# Patient Record
Sex: Male | Born: 1960 | Race: Black or African American | Hispanic: No | Marital: Single | Smoking: Current every day smoker
Health system: Southern US, Community
[De-identification: ages and names within clinical notes are randomized; demographics above are authoritative.]

## PROBLEM LIST (undated history)

## (undated) DIAGNOSIS — F1910 Other psychoactive substance abuse, uncomplicated: Secondary | ICD-10-CM

## (undated) DIAGNOSIS — K222 Esophageal obstruction: Secondary | ICD-10-CM

## (undated) DIAGNOSIS — J45909 Unspecified asthma, uncomplicated: Secondary | ICD-10-CM

## (undated) DIAGNOSIS — B3781 Candidal esophagitis: Secondary | ICD-10-CM

## (undated) DIAGNOSIS — R1310 Dysphagia, unspecified: Secondary | ICD-10-CM

## (undated) DIAGNOSIS — I5022 Chronic systolic (congestive) heart failure: Secondary | ICD-10-CM

## (undated) DIAGNOSIS — M1990 Unspecified osteoarthritis, unspecified site: Secondary | ICD-10-CM

## (undated) DIAGNOSIS — Z9119 Patient's noncompliance with other medical treatment and regimen: Secondary | ICD-10-CM

## (undated) DIAGNOSIS — Z91199 Patient's noncompliance with other medical treatment and regimen due to unspecified reason: Secondary | ICD-10-CM

## (undated) DIAGNOSIS — K219 Gastro-esophageal reflux disease without esophagitis: Secondary | ICD-10-CM

## (undated) DIAGNOSIS — I10 Essential (primary) hypertension: Secondary | ICD-10-CM

## (undated) DIAGNOSIS — E669 Obesity, unspecified: Secondary | ICD-10-CM

## (undated) HISTORY — PX: CORONARY ANGIOPLASTY: SHX604

## (undated) HISTORY — PX: ESOPHAGOGASTRODUODENOSCOPY: SHX1529

## (undated) HISTORY — PX: COLONOSCOPY: SHX174

---

## 2005-07-04 ENCOUNTER — Ambulatory Visit: Payer: Self-pay

## 2010-03-31 ENCOUNTER — Inpatient Hospital Stay: Payer: Self-pay | Admitting: Internal Medicine

## 2017-06-19 ENCOUNTER — Ambulatory Visit: Payer: Self-pay | Admitting: Orthopedic Surgery

## 2017-07-15 ENCOUNTER — Inpatient Hospital Stay: Admit: 2017-07-15 | Payer: Self-pay | Admitting: Orthopedic Surgery

## 2017-07-15 SURGERY — ARTHROPLASTY, HIP, TOTAL,POSTERIOR APPROACH
Anesthesia: Choice | Laterality: Right

## 2017-09-06 ENCOUNTER — Inpatient Hospital Stay
Admission: EM | Admit: 2017-09-06 | Discharge: 2017-09-09 | DRG: 293 | Disposition: A | Discharge status: Home / Self Care | Payer: BLUE CROSS/BLUE SHIELD | Attending: Internal Medicine | Admitting: Internal Medicine

## 2017-09-06 ENCOUNTER — Emergency Department: Payer: BLUE CROSS/BLUE SHIELD

## 2017-09-06 DIAGNOSIS — I110 Hypertensive heart disease with heart failure: Principal | ICD-10-CM | POA: Diagnosis present

## 2017-09-06 DIAGNOSIS — F10229 Alcohol dependence with intoxication, unspecified: Secondary | ICD-10-CM | POA: Diagnosis present

## 2017-09-06 DIAGNOSIS — R778 Other specified abnormalities of plasma proteins: Secondary | ICD-10-CM | POA: Diagnosis present

## 2017-09-06 DIAGNOSIS — R0602 Shortness of breath: Secondary | ICD-10-CM

## 2017-09-06 DIAGNOSIS — I5023 Acute on chronic systolic (congestive) heart failure: Secondary | ICD-10-CM | POA: Diagnosis present

## 2017-09-06 DIAGNOSIS — F1410 Cocaine abuse, uncomplicated: Secondary | ICD-10-CM | POA: Diagnosis present

## 2017-09-06 DIAGNOSIS — J45909 Unspecified asthma, uncomplicated: Secondary | ICD-10-CM | POA: Diagnosis present

## 2017-09-06 DIAGNOSIS — I440 Atrioventricular block, first degree: Secondary | ICD-10-CM | POA: Diagnosis present

## 2017-09-06 DIAGNOSIS — I340 Nonrheumatic mitral (valve) insufficiency: Secondary | ICD-10-CM | POA: Diagnosis not present

## 2017-09-06 DIAGNOSIS — I509 Heart failure, unspecified: Secondary | ICD-10-CM

## 2017-09-06 DIAGNOSIS — Y908 Blood alcohol level of 240 mg/100 ml or more: Secondary | ICD-10-CM | POA: Diagnosis present

## 2017-09-06 DIAGNOSIS — F17200 Nicotine dependence, unspecified, uncomplicated: Secondary | ICD-10-CM | POA: Diagnosis present

## 2017-09-06 DIAGNOSIS — M7989 Other specified soft tissue disorders: Secondary | ICD-10-CM | POA: Diagnosis present

## 2017-09-06 DIAGNOSIS — R609 Edema, unspecified: Secondary | ICD-10-CM

## 2017-09-06 DIAGNOSIS — Z79899 Other long term (current) drug therapy: Secondary | ICD-10-CM | POA: Diagnosis not present

## 2017-09-06 DIAGNOSIS — R7989 Other specified abnormal findings of blood chemistry: Secondary | ICD-10-CM

## 2017-09-06 DIAGNOSIS — F1010 Alcohol abuse, uncomplicated: Secondary | ICD-10-CM

## 2017-09-06 HISTORY — DX: Essential (primary) hypertension: I10

## 2017-09-06 HISTORY — DX: Unspecified asthma, uncomplicated: J45.909

## 2017-09-06 LAB — CBC
HEMATOCRIT: 45.2 % (ref 40.0–52.0)
HEMOGLOBIN: 14.9 g/dL (ref 13.0–18.0)
MCH: 32.7 pg (ref 26.0–34.0)
MCHC: 33 g/dL (ref 32.0–36.0)
MCV: 99.1 fL (ref 80.0–100.0)
Platelets: 154 10*3/uL (ref 150–440)
RBC: 4.56 MIL/uL (ref 4.40–5.90)
RDW: 13.7 % (ref 11.5–14.5)
WBC: 5.4 10*3/uL (ref 3.8–10.6)

## 2017-09-06 LAB — HEPATIC FUNCTION PANEL
ALBUMIN: 3.7 g/dL (ref 3.5–5.0)
ALK PHOS: 107 U/L (ref 38–126)
ALT: 18 U/L (ref 17–63)
AST: 48 U/L — AB (ref 15–41)
BILIRUBIN TOTAL: 1.7 mg/dL — AB (ref 0.3–1.2)
Bilirubin, Direct: 0.7 mg/dL — ABNORMAL HIGH (ref 0.1–0.5)
Indirect Bilirubin: 1 mg/dL — ABNORMAL HIGH (ref 0.3–0.9)
TOTAL PROTEIN: 8.1 g/dL (ref 6.5–8.1)

## 2017-09-06 LAB — BASIC METABOLIC PANEL
ANION GAP: 15 (ref 5–15)
BUN: 13 mg/dL (ref 6–20)
CHLORIDE: 94 mmol/L — AB (ref 101–111)
CO2: 29 mmol/L (ref 22–32)
Calcium: 8.1 mg/dL — ABNORMAL LOW (ref 8.9–10.3)
Creatinine, Ser: 1.2 mg/dL (ref 0.61–1.24)
Glucose, Bld: 84 mg/dL (ref 65–99)
POTASSIUM: 2.7 mmol/L — AB (ref 3.5–5.1)
SODIUM: 138 mmol/L (ref 135–145)

## 2017-09-06 LAB — URINALYSIS, COMPLETE (UACMP) WITH MICROSCOPIC
BACTERIA UA: NONE SEEN
BILIRUBIN URINE: NEGATIVE
GLUCOSE, UA: NEGATIVE mg/dL
Hgb urine dipstick: NEGATIVE
KETONES UR: NEGATIVE mg/dL
LEUKOCYTES UA: NEGATIVE
NITRITE: NEGATIVE
PH: 6 (ref 5.0–8.0)
PROTEIN: 30 mg/dL — AB
SQUAMOUS EPITHELIAL / LPF: NONE SEEN
Specific Gravity, Urine: 1.009 (ref 1.005–1.030)

## 2017-09-06 LAB — ETHANOL: ALCOHOL ETHYL (B): 270 mg/dL — AB (ref <10)

## 2017-09-06 LAB — TROPONIN I: Troponin I: 0.05 ng/mL (ref <0.03)

## 2017-09-06 LAB — BRAIN NATRIURETIC PEPTIDE: B NATRIURETIC PEPTIDE 5: 2029 pg/mL — AB (ref 0.0–100.0)

## 2017-09-06 MED ORDER — FUROSEMIDE 10 MG/ML IJ SOLN
40.0000 mg | Freq: Once | INTRAMUSCULAR | Status: AC
  Administered 2017-09-06: 40 mg via INTRAVENOUS
  Filled 2017-09-06: qty 4

## 2017-09-06 MED ORDER — POTASSIUM CHLORIDE 20 MEQ PO PACK: 40.0000 meq | PACK | Freq: Once | ORAL | Status: DC

## 2017-09-06 MED ORDER — POTASSIUM CHLORIDE 20 MEQ PO PACK: 60.0000 meq | PACK | Freq: Once | ORAL | Status: DC

## 2017-09-06 MED ORDER — ASPIRIN 81 MG PO CHEW
324.0000 mg | CHEWABLE_TABLET | Freq: Once | ORAL | Status: AC
  Administered 2017-09-06: 324 mg via ORAL
  Filled 2017-09-06: qty 4

## 2017-09-06 MED ORDER — LORAZEPAM 1 MG PO TABS: 1.0000 mg | ORAL_TABLET | Freq: Four times a day (QID) | ORAL | Status: DC | PRN

## 2017-09-06 MED ORDER — LORAZEPAM 2 MG/ML IJ SOLN: 1.0000 mg | Freq: Four times a day (QID) | INTRAMUSCULAR | Status: DC | PRN

## 2017-09-06 MED ORDER — ADULT MULTIVITAMIN W/MINERALS CH
1.0000 | ORAL_TABLET | Freq: Every day | ORAL | Status: DC
  Administered 2017-09-07 – 2017-09-08 (×2): 1 via ORAL
  Filled 2017-09-06: qty 1

## 2017-09-06 MED ORDER — POTASSIUM CHLORIDE CRYS ER 20 MEQ PO TBCR
40.0000 meq | EXTENDED_RELEASE_TABLET | Freq: Once | ORAL | Status: AC
  Administered 2017-09-06: 40 meq via ORAL
  Filled 2017-09-06: qty 2

## 2017-09-06 NOTE — ED Triage Notes (Signed)
Pt came to ED via pov c/o couch, sob, facial swelling for past few weeks per patient. Pt denies history of chf or heart problems. Pt HR 117

## 2017-09-06 NOTE — ED Notes (Signed)
Pt chewed tablets and drank water

## 2017-09-06 NOTE — H&P (Signed)
[facility] [place] [facility]   PATIENT NAME: Marcus Foster    MR#:  [id number]  DATE OF BIRTH:  05/04/1961  DATE OF ADMISSION:  09/06/2017  PRIMARY CARE PHYSICIAN: Patient, No Pcp Per   REQUESTING/REFERRING PHYSICIAN:   CHIEF COMPLAINT:   Chief Complaint  Patient presents with  . Facial Swelling  . Shortness of Breath    HISTORY OF PRESENT ILLNESS: Marcus Foster  is a 56 y.o. male with a known history of childhood asthma, esophageal stricture, dysphagia, diverticulosis, heavy alcohol abuse, chronic tobacco smoking abuse, chronic cocaine abuse-last use 3 days ago presents to the emergency room with 1-23-month history of worsening cough, shortness of breath, dyspnea on exertion, facial as well as lower extremity swelling, difficulty breathing when recumbent, in the emergency room patient was found to have alcohol level 270, chest x-ray noted for mild cardiomegaly/hyperinflation, EKG with sinus tachycardia with first-degree AV block/anterior infarct-age indeterminate-new since 2011 in comparison, potassium 2.7, chloride 94, AST 48, total bili 1.7, BNP greater than 2000, troponin 0.05, CBC unimpressive, urinalysis negative, urine drug screen pending, patient evaluated in the emergency room, noted to be in mild distress/respiratory distress, patient denies any pain, patient now being admitted for new/y diagnosis probable systolic congestive heart failure exacerbation most likely exacerbated by alcoholism/chronic cocaine/tobacco smoking abuse.  PAST MEDICAL HISTORY:   Past Medical History:  Diagnosis Date  . Asthma   . Hypertension     PAST SURGICAL HISTORY: none  SOCIAL HISTORY:  Social History   Tobacco Use  . Smoking status: Current Every Day Smoker  . Smokeless tobacco: Never Used  Substance Use Topics  . Alcohol use: Yes    Comment: "couple shots a day" and beer    FAMILY HISTORY: No family history on file.  DRUG ALLERGIES: No Known Allergies  REVIEW OF  SYSTEMS:   CONSTITUTIONAL: No fever, +fatigue/weakness.  EYES: No blurred or double vision.  EARS, NOSE, AND THROAT: No tinnitus or ear pain.  RESPIRATORY: +cough/shortness of breath/wheezing, no hemoptysis.  CARDIOVASCULAR: No chest pain, +orthopnea/edema.  GASTROINTESTINAL: No nausea, vomiting, diarrhea or abdominal pain.  GENITOURINARY: No dysuria, hematuria.  ENDOCRINE: No polyuria, nocturia,  HEMATOLOGY: No anemia, easy bruising or bleeding SKIN: No rash or lesion. MUSCULOSKELETAL: No joint pain or arthritis.   NEUROLOGIC: No tingling, numbness, weakness.  PSYCHIATRY: No anxiety or depression.   MEDICATIONS AT HOME:  Prior to Admission medications   Medication Sig Start Date End Date Taking? Authorizing Provider  amLODipine-atorvastatin (CADUET) 10-40 MG tablet Take 1 tablet by mouth daily.   Yes [provider]  cholecalciferol (VITAMIN D) 1000 units tablet Take 1,000 Units by mouth daily.   Yes [provider]  lisinopril (PRINIVIL,ZESTRIL) 10 MG tablet Take 30 mg by mouth daily. 05/24/15  Yes [provider]      PHYSICAL EXAMINATION:   VITAL SIGNS: Blood pressure (!) 128/111, pulse (!) 115, temperature 98.1 F (36.7 C), temperature source Oral, resp. rate 16, height 5\' 10"  (1.778 m), weight 95.3 kg (210 lb), SpO2 97 %.  GENERAL:  56 y.o.-year-old patient lying in the bed with mild respiratory acute distress.  Obese, nontoxic appearing EYES: Pupils equal, round, reactive to light and accommodation. No scleral icterus. Extraocular muscles intact.  HEENT: Head atraumatic, normocephalic. Oropharynx and nasopharynx clear.  NECK:  Supple, no jugular venous distention. No thyroid enlargement, no tenderness.  LUNGS: Mild Rales at the bases bilaterally, mild increased work of breathing.  CARDIOVASCULAR: S1, S2 normal. No murmurs, rubs, or  gallops.  ABDOMEN: Soft, nontender, nondistended. Bowel sounds present. No organomegaly or mass.  EXTREMITIES:  Bilateral lower extremity pitting edema, no cyanosis, or clubbing.  NEUROLOGIC: Cranial nerves II through XII are intact. MAES. Gait not checked.  PSYCHIATRIC: The patient is alert and oriented x 3.  SKIN: No obvious rash, lesion, or ulcer.   LABORATORY PANEL:   CBC Recent Labs  Lab 09/06/17 1825  WBC 5.4  HGB 14.9  HCT 45.2  PLT 154  MCV 99.1  MCH 32.7  MCHC 33.0  RDW 13.7   ------------------------------------------------------------------------------------------------------------------  Chemistries  Recent Labs  Lab 09/06/17 1825 09/06/17 1932  NA 138  --   K 2.7*  --   CL 94*  --   CO2 29  --   GLUCOSE 84  --   BUN 13  --   CREATININE 1.20  --   CALCIUM 8.1*  --   AST  --  48*  ALT  --  18  ALKPHOS  --  107  BILITOT  --  1.7*   ------------------------------------------------------------------------------------------------------------------ estimated creatinine clearance is 77.7 mL/min (by C-G formula based on SCr of 1.2 mg/dL). ------------------------------------------------------------------------------------------------------------------ No results for input(s): TSH, T4TOTAL, T3FREE, THYROIDAB in the last 72 hours.  Invalid input(s): FREET3   Coagulation profile No results for input(s): INR, PROTIME in the last 168 hours. ------------------------------------------------------------------------------------------------------------------- No results for input(s): DDIMER in the last 72 hours. -------------------------------------------------------------------------------------------------------------------  Cardiac Enzymes Recent Labs  Lab 09/06/17 1825  TROPONINI 0.05*   ------------------------------------------------------------------------------------------------------------------ Invalid input(s): POCBNP  ---------------------------------------------------------------------------------------------------------------  Urinalysis    Component  Value Date/Time   COLORURINE YELLOW (A) 09/06/2017 2145   APPEARANCEUR CLEAR (A) 09/06/2017 2145   LABSPEC 1.009 09/06/2017 2145   PHURINE 6.0 09/06/2017 2145   GLUCOSEU NEGATIVE 09/06/2017 2145   HGBUR NEGATIVE 09/06/2017 2145   BILIRUBINUR NEGATIVE 09/06/2017 2145   KETONESUR NEGATIVE 09/06/2017 2145   PROTEINUR 30 (A) 09/06/2017 2145   NITRITE NEGATIVE 09/06/2017 2145   LEUKOCYTESUR NEGATIVE 09/06/2017 2145     RADIOLOGY: Dg Chest 2 View  Result Date: 09/06/2017 CLINICAL DATA:  Short of breath EXAM: CHEST  2 VIEW COMPARISON:  07/26/1996 report FINDINGS: Mild hyperinflation. No pleural effusion or focal consolidation. Mild cardiomegaly. No pneumothorax. IMPRESSION: Hyperinflation without focal infiltrate or edema. Mild cardiomegaly. Electronically Signed   By: Jasmine Pang M.D.   On: 09/06/2017 19:21    EKG: Orders placed or performed during the hospital encounter of 09/06/17  . EKG 12-Lead  . EKG 12-Lead  . ED EKG within 10 minutes  . ED EKG within 10 minutes    IMPRESSION AND PLAN: 1 acute probable newly diagnosed systolic congestive heart failure exacerbation Most likely secondary to multifactorial process which includes chronic cocaine abuse-last use 3 days ago, alcoholism, and chronic tobacco smoking  Admit to RNF bed on her CHF protocol, IV Lasix twice daily, aspirin daily, lisinopril, avoid beta-blocker therapy given chronic cocaine abuse, statin therapy-check lipids in the morning, check echocardiogram, check urine drug screen, strict I&O monitoring, daily weights, cardiac/low-sodium diet, continue close medical monitoring  2 acute alcohol intoxication with chronic alcoholism Drinks 1/2-1 pint of liquor daily, no interest in quitting Will place on alcohol withdrawal protocol, seizure/fall precautions  3 chronic cocaine abuse Last used 3 days ago Check urine drug screen Patient advised to quit  4 chronic tobacco smoking abuse/dependency Nicotine patch daily and  cessation counseling ordered  5 chronic benign essential hypertension Stable Continue home regimen, IV hydralazine as needed systolic blood pressure greater than 160,  vitals per routine, make changes as necessary  Full code Patient stable Prognosis fair DVT prophylaxis with Lovenox subcu Disposition Home in 2 - 3 days    All the records are reviewed and case discussed with ED provider. Management plans discussed with the patient, family and they are in agreement.  CODE STATUS: Code Status History    This patient does not have a recorded code status. Please follow your organizational policy for patients in this situation.       TOTAL TIME TAKING CARE OF THIS PATIENT: 40 minutes.    Evelena AsaMontell D Zafira Munos M.D on 09/06/2017   Between 7am to 6pm - Pager - [phone]  After 6pm go to [email or web address] - password [email or web address]  [facility]  Office  [phone]  CC: Primary care physician; Patient, No Pcp Per   Note: This dictation was prepared with Dragon dictation along with smaller phrase technology. Any transcriptional errors that result from this process are unintentional.

## 2017-09-06 NOTE — ED Notes (Signed)
CRITICAL LAB: Potassium is 2.7, TROPONIN is 0.05, [employer], Dr. Mayford Knife notified, orders received

## 2017-09-06 NOTE — ED Provider Notes (Addendum)
[facility] Emergency Department Provider Note       Time seen: ----------------------------------------- 7:18 PM on 09/06/2017 -----------------------------------------   I have reviewed the triage vital signs and the nursing notes.  HISTORY   Chief Complaint Facial Swelling and Shortness of Breath    HPI Marcus Foster is a 56 y.o. male with a history of asthma and hypertension who presents to the ED for cough and shortness of breath.  Patient describes particular shortness of breath when he lays flat.  He denies any known history of CHF or heart problems.  This has been persistent for the last several weeks but is now worsening over the past several days to the point where he has swelling diffusely including his face.  Patient states he drinks a pint of beer every day  Past Medical History:  Diagnosis Date  . Asthma   . Hypertension     There are no active problems to display for this patient.     Allergies Patient has no known allergies.  Social History Social History   Tobacco Use  . Smoking status: Current Every Day Smoker  . Smokeless tobacco: Never Used  Substance Use Topics  . Alcohol use: Yes    Comment: "couple shots a day" and beer  . Drug use: Not on file    Review of Systems Constitutional: Negative for fever. Eyes: Negative for vision changes ENT:  Negative for congestion, sore throat Cardiovascular: Negative for chest pain. Respiratory: Positive for shortness of breath Gastrointestinal: Negative for abdominal pain, vomiting and diarrhea. Musculoskeletal: Negative for back pain.  Positive for edema Skin: Negative for rash. Neurological: Negative for headaches, focal weakness or numbness.  All systems negative/normal/unremarkable except as stated in the HPI  ____________________________________________   PHYSICAL EXAM:  VITAL SIGNS: ED Triage Vitals  Enc Vitals Group     BP 09/06/17 1822 125/83     Pulse Rate  09/06/17 1822 (!) 112     Resp 09/06/17 1822 20     Temp 09/06/17 1822 98.1 F (36.7 C)     Temp Source 09/06/17 1822 Oral     SpO2 09/06/17 1822 96 %     Weight 09/06/17 1821 210 lb (95.3 kg)     Height 09/06/17 1821 5\' 10"  (1.778 m)     Head Circumference --      Peak Flow --      Pain Score --      Pain Loc --      Pain Edu? --      Excl. in GC? --     Constitutional: Alert and oriented. Well appearing and in no distress. Eyes: Conjunctivae are icteric. Normal extraocular movements. ENT   Head: Normocephalic and atraumatic.  Facial edema is noted   Nose: No congestion/rhinnorhea.   Mouth/Throat: Mucous membranes are moist.   Neck: No stridor. Cardiovascular: Normal rate, regular rhythm. No murmurs, rubs, or gallops. Respiratory: Normal respiratory effort without tachypnea nor retractions.  Scattered rhonchi Gastrointestinal: Soft and nontender. Normal bowel sounds Musculoskeletal: Nontender with normal range of motion in extremities.  Bilateral edema Neurologic:  Normal speech and language. No gross focal neurologic deficits are appreciated.  Skin:  Skin is warm, dry and intact. No rash noted. Psychiatric: Mood and affect are normal. Speech and behavior are normal.  ____________________________________________  EKG: Interpreted by me.  Sinus tachycardia with a rate of 119 bpm, prolonged PR interval, normal QT, normal axis.  ____________________________________________  ED COURSE:  Pertinent labs & imaging  results that were available during my care of the patient were reviewed by me and considered in my medical decision making (see chart for details). Patient presents for shortness of breath and edema, we will assess with labs and imaging as indicated. Clinical Course as of Sep 06 2128  Wynelle Link Sep 06, 2017  2011 Alcohol, Ethyl (B): (!) 270 [JW]  2011 Troponin I: (!!) 0.05 [JW]  2011 Potassium: (!!) 2.7 [JW]    Clinical Course User Index [JW] [name] Filbert, MD   Procedures ____________________________________________   LABS (pertinent positives/negatives)  Labs Reviewed  BASIC METABOLIC PANEL - Abnormal; Notable for the following components:      Result Value   Potassium 2.7 (*)    Chloride 94 (*)    Calcium 8.1 (*)    All other components within normal limits  TROPONIN I - Abnormal; Notable for the following components:   Troponin I 0.05 (*)    All other components within normal limits  HEPATIC FUNCTION PANEL - Abnormal; Notable for the following components:   AST 48 (*)    Total Bilirubin 1.7 (*)    Bilirubin, Direct 0.7 (*)    Indirect Bilirubin 1.0 (*)    All other components within normal limits  ETHANOL - Abnormal; Notable for the following components:   Alcohol, Ethyl (B) 270 (*)    All other components within normal limits  BRAIN NATRIURETIC PEPTIDE - Abnormal; Notable for the following components:   B Natriuretic Peptide 2,029.0 (*)    All other components within normal limits  CBC  URINALYSIS, COMPLETE (UACMP) WITH MICROSCOPIC    RADIOLOGY Images were viewed by me  Chest x-ray IMPRESSION: Hyperinflation without focal infiltrate or edema. Mild cardiomegaly. ____________________________________________  DIFFERENTIAL DIAGNOSIS   CHF, intoxication, renal failure, cirrhosis, alcohol induced cardiomyopathy, nephrotic syndrome  FINAL ASSESSMENT AND PLAN  Shortness of breath, edema, alcohol intoxication, elevated troponin   Plan: Patient had presented for shortness of breath and swelling that has progressively worsened. Patient's labs were concerning for several reasons including elevated troponin, elevated BNP and elevated alcohol level.  Potassium was also low for which she was given oral potassium.  Patient's imaging was overall reassuring although he likely has some cardiomegaly. 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  I will give him an adult aspirin  as well as IV Lasix and discuss with the hospitalist for admission.   [name] Filbert, MD   Note: This note was generated in part or whole with voice recognition software. Voice recognition is usually quite accurate but there are transcription errors that can and very often do occur. I apologize for any typographical errors that were not detected and corrected.     [name] Filbert, MD 09/06/17 2130    [name] Filbert, MD 09/06/17 2132

## 2017-09-07 ENCOUNTER — Inpatient Hospital Stay (HOSPITAL_COMMUNITY)
Admit: 2017-09-07 | Discharge: 2017-09-07 | Disposition: A | Discharge status: Home / Self Care | Payer: BLUE CROSS/BLUE SHIELD | Attending: Family Medicine | Admitting: Family Medicine

## 2017-09-07 ENCOUNTER — Other Ambulatory Visit: Payer: Self-pay

## 2017-09-07 DIAGNOSIS — I340 Nonrheumatic mitral (valve) insufficiency: Secondary | ICD-10-CM

## 2017-09-07 LAB — BASIC METABOLIC PANEL
Anion gap: 13 (ref 5–15)
BUN: 13 mg/dL (ref 6–20)
CALCIUM: 8.1 mg/dL — AB (ref 8.9–10.3)
CHLORIDE: 97 mmol/L — AB (ref 101–111)
CO2: 30 mmol/L (ref 22–32)
CREATININE: 1.12 mg/dL (ref 0.61–1.24)
Glucose, Bld: 101 mg/dL — ABNORMAL HIGH (ref 65–99)
Potassium: 3.2 mmol/L — ABNORMAL LOW (ref 3.5–5.1)
SODIUM: 140 mmol/L (ref 135–145)

## 2017-09-07 LAB — URINE DRUG SCREEN, QUALITATIVE (ARMC ONLY)
AMPHETAMINES, UR SCREEN: NOT DETECTED
BARBITURATES, UR SCREEN: NOT DETECTED
Benzodiazepine, Ur Scrn: NOT DETECTED
COCAINE METABOLITE, UR ~~LOC~~: POSITIVE — AB
Cannabinoid 50 Ng, Ur ~~LOC~~: NOT DETECTED
MDMA (ECSTASY) UR SCREEN: NOT DETECTED
METHADONE SCREEN, URINE: NOT DETECTED
Opiate, Ur Screen: NOT DETECTED
Phencyclidine (PCP) Ur S: NOT DETECTED
TRICYCLIC, UR SCREEN: NOT DETECTED

## 2017-09-07 LAB — TROPONIN I
TROPONIN I: 0.04 ng/mL — AB (ref <0.03)
TROPONIN I: 0.04 ng/mL — AB (ref <0.03)
Troponin I: 0.05 ng/mL (ref <0.03)

## 2017-09-07 LAB — MAGNESIUM: Magnesium: 1.1 mg/dL — ABNORMAL LOW (ref 1.7–2.4)

## 2017-09-07 LAB — TSH: TSH: 7.547 u[IU]/mL — ABNORMAL HIGH (ref 0.350–4.500)

## 2017-09-07 MED ORDER — SODIUM CHLORIDE 0.9% FLUSH: 3.0000 mL | Freq: Two times a day (BID) | INTRAVENOUS | Status: DC

## 2017-09-07 MED ORDER — BISACODYL 5 MG PO TBEC: 5.0000 mg | DELAYED_RELEASE_TABLET | Freq: Every day | ORAL | Status: DC | PRN

## 2017-09-07 MED ORDER — ONDANSETRON HCL 4 MG/2ML IJ SOLN: 4.0000 mg | Freq: Four times a day (QID) | INTRAMUSCULAR | Status: DC | PRN

## 2017-09-07 MED ORDER — POTASSIUM CHLORIDE CRYS ER 20 MEQ PO TBCR: 40.0000 meq | EXTENDED_RELEASE_TABLET | Freq: Once | ORAL | Status: AC

## 2017-09-07 MED ORDER — NICOTINE 21 MG/24HR TD PT24
21.0000 mg | MEDICATED_PATCH | Freq: Every day | TRANSDERMAL | Status: DC
  Filled 2017-09-07 (×2): qty 1

## 2017-09-07 MED ORDER — LISINOPRIL 20 MG PO TABS
30.0000 mg | ORAL_TABLET | Freq: Every day | ORAL | Status: DC
  Administered 2017-09-07 – 2017-09-09 (×3): 30 mg via ORAL
  Filled 2017-09-07 (×3): qty 1

## 2017-09-07 MED ORDER — FUROSEMIDE 10 MG/ML IJ SOLN
40.0000 mg | Freq: Two times a day (BID) | INTRAMUSCULAR | Status: DC
  Administered 2017-09-07 – 2017-09-08 (×4): 40 mg via INTRAVENOUS
  Filled 2017-09-07 (×4): qty 4

## 2017-09-07 MED ORDER — FOLIC ACID 1 MG PO TABS
1.0000 mg | ORAL_TABLET | Freq: Every day | ORAL | Status: DC
  Administered 2017-09-07 – 2017-09-09 (×3): 1 mg via ORAL
  Filled 2017-09-07 (×3): qty 1

## 2017-09-07 MED ORDER — PREMIER PROTEIN SHAKE
11.0000 [oz_av] | Freq: Two times a day (BID) | ORAL | Status: DC
  Administered 2017-09-08 – 2017-09-09 (×4): 11 [oz_av] via ORAL

## 2017-09-07 MED ORDER — AMLODIPINE-ATORVASTATIN 10-40 MG PO TABS: 1.0000 | ORAL_TABLET | Freq: Every day | ORAL | Status: DC

## 2017-09-07 MED ORDER — SODIUM CHLORIDE 0.9 % IV SOLN: 250.0000 mL | INTRAVENOUS | Status: DC | PRN

## 2017-09-07 MED ORDER — ONDANSETRON HCL 4 MG PO TABS: 4.0000 mg | ORAL_TABLET | Freq: Four times a day (QID) | ORAL | Status: DC | PRN

## 2017-09-07 MED ORDER — HYDRALAZINE HCL 20 MG/ML IJ SOLN: 10.0000 mg | INTRAMUSCULAR | Status: DC | PRN

## 2017-09-07 MED ORDER — ACETAMINOPHEN 325 MG PO TABS: 650.0000 mg | ORAL_TABLET | Freq: Four times a day (QID) | ORAL | Status: DC | PRN

## 2017-09-07 MED ORDER — POLYETHYLENE GLYCOL 3350 17 G PO PACK
17.0000 g | PACK | Freq: Every day | ORAL | Status: DC | PRN
Start: 2017-09-07 — End: 2017-09-09

## 2017-09-07 MED ORDER — ADULT MULTIVITAMIN W/MINERALS CH
1.0000 | ORAL_TABLET | Freq: Every day | ORAL | Status: DC
  Administered 2017-09-09: 1 via ORAL
  Filled 2017-09-07 (×4): qty 1

## 2017-09-07 MED ORDER — AMLODIPINE BESYLATE 10 MG PO TABS
10.0000 mg | ORAL_TABLET | Freq: Every day | ORAL | Status: DC
  Administered 2017-09-07 – 2017-09-08 (×2): 10 mg via ORAL
  Filled 2017-09-07 (×2): qty 1

## 2017-09-07 MED ORDER — IPRATROPIUM-ALBUTEROL 0.5-2.5 (3) MG/3ML IN SOLN: 3.0000 mL | Freq: Four times a day (QID) | RESPIRATORY_TRACT | Status: DC | PRN

## 2017-09-07 MED ORDER — VITAMIN B-1 100 MG PO TABS
100.0000 mg | ORAL_TABLET | Freq: Every day | ORAL | Status: DC
  Administered 2017-09-07 – 2017-09-08 (×2): 100 mg via ORAL
  Filled 2017-09-07 (×2): qty 1

## 2017-09-07 MED ORDER — ATORVASTATIN CALCIUM 20 MG PO TABS
40.0000 mg | ORAL_TABLET | Freq: Every day | ORAL | Status: DC
  Administered 2017-09-07 – 2017-09-08 (×2): 40 mg via ORAL
  Filled 2017-09-07 (×2): qty 2

## 2017-09-07 MED ORDER — SODIUM CHLORIDE 0.9% FLUSH
3.0000 mL | Freq: Two times a day (BID) | INTRAVENOUS | Status: DC
  Administered 2017-09-07 – 2017-09-09 (×6): 3 mL via INTRAVENOUS

## 2017-09-07 MED ORDER — ACETAMINOPHEN 650 MG RE SUPP: 650.0000 mg | Freq: Four times a day (QID) | RECTAL | Status: DC | PRN

## 2017-09-07 MED ORDER — SODIUM CHLORIDE 0.9% FLUSH: 3.0000 mL | INTRAVENOUS | Status: DC | PRN

## 2017-09-07 MED ORDER — ENOXAPARIN SODIUM 40 MG/0.4ML ~~LOC~~ SOLN
40.0000 mg | SUBCUTANEOUS | Status: DC
  Administered 2017-09-07 – 2017-09-08 (×2): 40 mg via SUBCUTANEOUS
  Filled 2017-09-07 (×3): qty 0.4

## 2017-09-07 MED ORDER — GUAIFENESIN-DM 100-10 MG/5ML PO SYRP
5.0000 mL | ORAL_SOLUTION | ORAL | Status: DC | PRN
  Administered 2017-09-07 – 2017-09-08 (×4): 5 mL via ORAL
  Filled 2017-09-07 (×4): qty 5

## 2017-09-07 MED ORDER — ASPIRIN EC 81 MG PO TBEC
81.0000 mg | DELAYED_RELEASE_TABLET | Freq: Every day | ORAL | Status: DC
  Administered 2017-09-07 – 2017-09-09 (×3): 81 mg via ORAL
  Filled 2017-09-07 (×3): qty 1

## 2017-09-07 MED ORDER — VITAMIN D 1000 UNITS PO TABS
1000.0000 [IU] | ORAL_TABLET | Freq: Every day | ORAL | Status: DC
  Administered 2017-09-07 – 2017-09-09 (×3): 1000 [IU] via ORAL
  Filled 2017-09-07 (×3): qty 1

## 2017-09-07 MED ORDER — POTASSIUM CHLORIDE 20 MEQ PO PACK
40.0000 meq | PACK | Freq: Once | ORAL | Status: AC
  Administered 2017-09-07: 40 meq via ORAL
  Filled 2017-09-07: qty 2

## 2017-09-07 MED ORDER — DOCUSATE SODIUM 100 MG PO CAPS
100.0000 mg | ORAL_CAPSULE | Freq: Two times a day (BID) | ORAL | Status: DC
  Filled 2017-09-07 (×3): qty 1

## 2017-09-07 NOTE — Plan of Care (Signed)
  Progressing Education: Knowledge of General Education information will improve 09/07/2017 1223 - Progressing by Tomie China, RN Health Behavior/Discharge Planning: Ability to manage health-related needs will improve 09/07/2017 1223 - Progressing by Tomie China, RN Clinical Measurements: Ability to maintain clinical measurements within normal limits will improve 09/07/2017 1223 - Progressing by Tomie China, RN Will remain free from infection 09/07/2017 1223 - Progressing by Tomie China, RN Diagnostic test results will improve 09/07/2017 1223 - Progressing by Tomie China, RN Respiratory complications will improve 09/07/2017 1223 - Progressing by Tomie China, RN Cardiovascular complication will be avoided 09/07/2017 1223 - Progressing by Tomie China, RN Activity: Risk for activity intolerance will decrease 09/07/2017 1223 - Progressing by Tomie China, RN Nutrition: Adequate nutrition will be maintained 09/07/2017 1223 - Progressing by Tomie China, RN Coping: Level of anxiety will decrease 09/07/2017 1223 - Progressing by Tomie China, RN Elimination: Will not experience complications related to bowel motility 09/07/2017 1223 - Progressing by Tomie China, RN Will not experience complications related to urinary retention 09/07/2017 1223 - Progressing by Tomie China, RN Pain Managment: General experience of comfort will improve 09/07/2017 1223 - Progressing by Tomie China, RN Safety: Ability to remain free from injury will improve 09/07/2017 1223 - Progressing by Tomie China, RN Skin Integrity: Risk for impaired skin integrity will decrease 09/07/2017 1223 - Progressing by Tomie China, RN

## 2017-09-07 NOTE — Progress Notes (Signed)
Nutrition Education Note  RD consulted for nutrition education regarding new onset CHF.  RD provided "Low Sodium Nutrition Therapy" handout from the [employer]. Reviewed patient's dietary recall. Provided examples on ways to decrease sodium intake in diet. Discouraged intake of processed foods and use of salt shaker. Encouraged fresh fruits and vegetables as well as whole grain sources of carbohydrates to maximize fiber intake.   RD discussed why it is important for patient to adhere to diet recommendations, and emphasized the role of fluids, foods to avoid, and importance of weighing self daily. Teach back method used.  Expect poor compliance.  Body mass index is 29.11 kg/m. Pt meets criteria for normal BMI based on current BMI.  Current diet order is heart healthy, patient is consuming approximately 25% of meals at this time.   RD following this pt  Betsey Holiday MS, RD, LDN Pager #[phone] After Hours Pager: [phone]

## 2017-09-07 NOTE — Care Management Note (Addendum)
Case Management Note  Patient Details  Name: Marcus Foster MRN: [id number] Date of Birth: 02/05/1961  Subjective/Objective:                 New onset CHF.  Lives with a roommate.  Employed and has insurance.  Patient says has mediation coverage.  Currently not requiring supplemental oxygen. No PCP "but I made an appointment to get started with someone for Oct 27 2017.  He does not know the name of the physician.  Then says his sister may "know the name of someone." Has scales at home. Discussed heart failure clinic appointment that has been made.   It is difficult to engage patient in conversation. Lack of eye contact with CM.  Action/Plan:   Provided patient with diet handout and the Living with Heart Failure Education Booklet  Expected Discharge Date:                  Expected Discharge Plan:     In-House Referral:     Discharge planning Services     Post Acute Care Choice:    Choice offered to:     DME Arranged:    DME Agency:     HH Arranged:    HH Agency:     Status of Service:     If discussed at [employer], dates discussed:    Additional Comments:  Eber Hong, RN 09/07/2017, 4:01 PM

## 2017-09-07 NOTE — Progress Notes (Signed)
[facility] -  at [facility]   PATIENT NAME: Marcus Foster    MR#:  [id number]  DATE OF BIRTH:  06-Jan-1961  SUBJECTIVE:     Patient came in with increasing shortness of breath and leg swelling.  Was found to be in congestive heart failure.  Feels a lot better after receiving Lasix.  REVIEW OF SYSTEMS:   Review of Systems  Constitutional: Negative for chills, fever and weight loss.  HENT: Negative for ear discharge, ear pain and nosebleeds.   Eyes: Negative for blurred vision, pain and discharge.  Respiratory: Negative for sputum production, shortness of breath, wheezing and stridor.   Cardiovascular: Positive for leg swelling. Negative for chest pain, palpitations, orthopnea and PND.  Gastrointestinal: Negative for abdominal pain, diarrhea, nausea and vomiting.  Genitourinary: Negative for frequency and urgency.  Musculoskeletal: Negative for back pain and joint pain.  Neurological: Positive for weakness. Negative for sensory change, speech change and focal weakness.  Psychiatric/Behavioral: Negative for depression and hallucinations. The patient is not nervous/anxious.    Tolerating Diet: Yes Tolerating PT: Ambulatory  DRUG ALLERGIES:  No Known Allergies  VITALS:  Blood pressure (!) 139/97, pulse (!) 113, temperature 97.7 F (36.5 C), temperature source Oral, resp. rate 18, height 5\' 10"  (1.778 m), weight 92 kg (202 lb 14.4 oz), SpO2 97 %.  PHYSICAL EXAMINATION:   Physical Exam  GENERAL:  56 y.o.-year-old patient lying in the bed with no acute distress.  EYES: Pupils equal, round, reactive to light and accommodation. No scleral icterus. Extraocular muscles intact.  HEENT: Head atraumatic, normocephalic. Oropharynx and nasopharynx clear.  NECK:  Supple, no jugular venous distention. No thyroid enlargement, no tenderness.  LUNGS: Normal breath sounds bilaterally, no wheezing, rales, rhonchi. No use of accessory muscles of respiration.   CARDIOVASCULAR: S1, S2 normal. No murmurs, rubs, or gallops.  ABDOMEN: Soft, nontender, nondistended. Bowel sounds present. No organomegaly or mass.  EXTREMITIES: No cyanosis, clubbing ++ edema b/l.    NEUROLOGIC: Cranial nerves II through XII are intact. No focal Motor or sensory deficits b/l.   PSYCHIATRIC:  patient is alert and oriented x 3.  SKIN: No obvious rash, lesion, or ulcer.   LABORATORY PANEL:  CBC Recent Labs  Lab 09/06/17 1825  WBC 5.4  HGB 14.9  HCT 45.2  PLT 154    Chemistries  Recent Labs  Lab 09/06/17 1932 09/07/17 0027 09/07/17 0542  NA  --   --  140  K  --   --  3.2*  CL  --   --  97*  CO2  --   --  30  GLUCOSE  --   --  101*  BUN  --   --  13  CREATININE  --   --  1.12  CALCIUM  --   --  8.1*  MG  --  1.1*  --   AST 48*  --   --   ALT 18  --   --   ALKPHOS 107  --   --   BILITOT 1.7*  --   --    Cardiac Enzymes Recent Labs  Lab 09/07/17 1125  TROPONINI 0.04*   RADIOLOGY:  Dg Chest 2 View  Result Date: 09/06/2017 CLINICAL DATA:  Short of breath EXAM: CHEST  2 VIEW COMPARISON:  07/26/1996 report FINDINGS: Mild hyperinflation. No pleural effusion or focal consolidation. Mild cardiomegaly. No pneumothorax. IMPRESSION: Hyperinflation without focal infiltrate or edema. Mild cardiomegaly. Electronically Signed   By: Jasmine Pang  M.D.   On: 09/06/2017 19:21   ASSESSMENT AND PLAN:   Otila Backvery Lantis  is a 56 y.o. male with a known history of childhood asthma, esophageal stricture, dysphagia, diverticulosis, heavy alcohol abuse, chronic tobacco smoking abuse, chronic cocaine abuse-last use 3 days ago presents to the emergency room with 10-7132-month history of worsening cough, shortness of breath, dyspnea on exertion, facial as well as lower extremity swelling, difficulty breathing when recumbent, in the emergency room patient was found to have alcohol level 270  1 acute  newly diagnosed  congestive heart failure exacerbation--unknown EF.  Echo  pending -Most likely secondary to multifactorial process which includes chronic cocaine abuse-last use 3 days ago, alcoholism, and chronic tobacco smoking  - IV Lasix twice daily, aspirin daily, lisinopril, avoid beta-blocker therapy given chronic cocaine abuse, statin therapy-check lipids in the morning-  2 acute alcohol intoxication with chronic alcoholism Drinks 1/2-1 pint of liquor daily, no interest in quitting Will place on alcohol withdrawal protocol, seizure/fall precautions  3 chronic cocaine abuse Last used 3 days ago Patient advised to quit  4 chronic tobacco smoking abuse/dependency Nicotine patch daily and cessation counseling ordered  5 chronic benign essential hypertension Stable Continue home regimen, IV hydralazine as needed systolic blood pressure greater than 160, vitals per routine, make changes as necessary  Full code Patient stable Prognosis fair DVT prophylaxis with Lovenox subcu Disposition Home in 2 - 3 days  Case discussed with Care Management/Social Worker. Management plans discussed with the patient, family and they are in agreement.  CODE STATUS: Full  DVT Prophylaxis: Lovenox  TOTAL TIME TAKING CARE OF THIS PATIENT: *Any 5* minutes.  >50% time spent on counselling and coordination of care  POSSIBLE D/C IN 1-2* DAYS, DEPENDING ON CLINICAL CONDITION.  Note: This dictation was prepared with Dragon dictation along with smaller phrase technology. Any transcriptional errors that result from this process are unintentional.  Enedina FinnerSona Braelynn Lupton M.D on 09/07/2017 at 3:01 PM  Between 7am to 6pm - Pager - [phone]  After 6pm go to [email or web address] - password [email or web address]  [facility]  Office  [phone]  CC: Primary care physician; Patient, No Pcp Per

## 2017-09-07 NOTE — Progress Notes (Signed)
*  PRELIMINARY RESULTS* Echocardiogram 2D Echocardiogram has been performed.  Marcus Foster [name] 09/07/2017, 9:30 PM 

## 2017-09-07 NOTE — Progress Notes (Addendum)
Initial Nutrition Assessment  DOCUMENTATION CODES:   Not applicable  INTERVENTION:   Recommend check thiamine levels as pt at risk for wet Beri Beri   Recommend check Mg and P levels and supplement as needed  Premier Protein BID, each supplement provides 160 kcal and 30 grams of protein.   MVI  Thiamine 113m po daily- recommend IV supplementation vs PO  Folic acid 131mpo daily  Dysphagia 3 diet as pt with h/o esophageal stricture and poor dentition  NUTRITION DIAGNOSIS:   Increased nutrient needs related to (chronic etoh abuse ) as evidenced by increased estimated needs.  GOAL:   Patient will meet greater than or equal to 90% of their needs  MONITOR:   PO intake, Supplement acceptance, Labs, Weight trends, I & O's  REASON FOR ASSESSMENT:   Consult Diet education  ASSESSMENT:   [age].o. male with a known history of childhood asthma, esophageal stricture, dysphagia, diverticulosis, heavy alcohol abuse, chronic tobacco smoking abuse, chronic cocaine abuse-last use 3 days ago presents to the emergency room with 10-14-54-monthstory of worsening cough, shortness of breath, dyspnea on exertion, facial as well as lower extremity swelling, difficulty breathing when recumbent, in the emergency room patient was found to have alcohol level 270   Met with pt in room today. Pt reports good appetite and oral intake pta. Pt currently eating <25% of meals and reports he hates the food. Pt with h/o etoh abuse; pt drinks 1/2-1 pint liquor daily. Pt is at high risk for wet beri beri (thiamine deficiency) given his symptoms of heart failure, history of etoh abuse, and use of chronic PPIs. Recommend check thiamine levels. Per chart, pt with wt gain. Pt reports his UBW is around 180lbs. Pt with h/o esophogeal stricture s/p multiple dilations; pt is requesting a soft diet today. Pt with low Magnesium; recommend supplement as needed per MD discretion. Recommend check phosphorus levels as well. Pt  with low potassium being supplemented. RD will order Premier Protein to help pt meet his estimated protein needs.   Medications reviewed and include: aspirin, vitamin D, colace, lovenox, folic acid, lasix, MVI, nicotine, thiamine  Labs reviewed: K 3.2(L), Cl 97(L), Ca 8.1(L), Mg 1.1(L) BNP- 2029(H)  Nutrition-Focused physical exam completed. Findings are no fat depletion, no muscle depletion, and mild edema BLE.   Diet Order:  Diet Heart Room service appropriate? Yes; Fluid consistency: Thin  EDUCATION NEEDS:   Education needs have been addressed  Skin:  Reviewed RN Assessment  Last BM:  11/25  Height:   Ht Readings from Last 1 Encounters:  09/06/17 '5\' 10"'  (1.778 m)    Weight:   Wt Readings from Last 1 Encounters:  09/07/17 202 lb 14.4 oz (92 kg)    Ideal Body Weight:  75.45 kg  BMI:  Body mass index is 29.11 kg/m.  Estimated Nutritional Needs:   Kcal:  1800-2100kcal/day   Protein:  92-110g/day   Fluid:  per MD  CasKoleen Distance, RD, LDN Pager #- [phone] Hours Pager: [phone]

## 2017-09-08 ENCOUNTER — Encounter: Payer: Self-pay | Admitting: *Deleted

## 2017-09-08 LAB — MAGNESIUM: Magnesium: 0.7 mg/dL — CL (ref 1.7–2.4)

## 2017-09-08 LAB — ECHOCARDIOGRAM COMPLETE
Height: 70 in
Weight: 3246.4 oz

## 2017-09-08 LAB — PHOSPHORUS: Phosphorus: 3.7 mg/dL (ref 2.5–4.6)

## 2017-09-08 LAB — HIV ANTIBODY (ROUTINE TESTING W REFLEX): HIV SCREEN 4TH GENERATION: NONREACTIVE

## 2017-09-08 MED ORDER — MAGNESIUM SULFATE 4 GM/100ML IV SOLN
4.0000 g | Freq: Once | INTRAVENOUS | Status: AC
  Administered 2017-09-08: 4 g via INTRAVENOUS
  Filled 2017-09-08: qty 100

## 2017-09-08 MED ORDER — POTASSIUM CHLORIDE CRYS ER 20 MEQ PO TBCR
40.0000 meq | EXTENDED_RELEASE_TABLET | Freq: Once | ORAL | Status: AC
  Administered 2017-09-08: 40 meq via ORAL
  Filled 2017-09-08: qty 2

## 2017-09-08 MED ORDER — VITAMIN B-1 100 MG PO TABS: 100.0000 mg | ORAL_TABLET | Freq: Every day | ORAL | Status: DC

## 2017-09-08 MED ORDER — THIAMINE HCL 100 MG/ML IJ SOLN
100.0000 mg | Freq: Every day | INTRAMUSCULAR | Status: AC
  Administered 2017-09-08 – 2017-09-09 (×2): 100 mg via INTRAVENOUS
  Filled 2017-09-08 (×2): qty 2

## 2017-09-08 NOTE — Progress Notes (Signed)
CRITICAL VALUE ALERT  Critical Value:  MG 0.7  Date & Time Notied:  09/08/17 0825  Provider Notified: Dr. Enedina Finner  Orders Received/Actions taken: new orders received

## 2017-09-08 NOTE — Plan of Care (Signed)
  Progressing Pain Managment: General experience of comfort will improve 09/08/2017 1435 - Progressing by Donald Prose, RN Note PRN medications  Safety: Ability to remain free from injury will improve 09/08/2017 1435 - Progressing by Donald Prose, RN Note Fall precautions in place non skid socks when oob Cardiac: Ability to achieve and maintain adequate cardiopulmonary perfusion will improve 09/08/2017 1435 - Progressing by Donald Prose, RN Note IV Lasix, intake and output, daily weights   Not Progressing Clinical Measurements: Diagnostic test results will improve 09/08/2017 1435 - Not Progressing by Donald Prose, RN Note K 3.3, MG 0.7, replacing both   Not Applicable Clinical Measurements: Will remain free from infection 09/08/2017 1435 - Not Applicable by Donald Prose, RN

## 2017-09-08 NOTE — Progress Notes (Addendum)
Patient admitted with dx of CHF. Echo performed on 09/07/2017 revealed EF of 20 - 25%.?BNP on admission:  2029.0.  PMH: ?HTN, Childhood Asthma, esophageal stricture, dysphagia, diverticulosis, heavy alcohol abuse, chronic tobacco abuse, chronic cocaine abuse, with last use 3 days prior to admission.  Patient's chief complaint upon arrival to ED was 1-2 month history of worsening cough, SOB, DOE, facial and lower extremity swelling.     CHF Education:  Educational session with patient completed. Patient sitting up in the bed having with persistent coughing. Patient reported nurse had given him something for his cough recently.  Patient alert and oriented.    ? Provided patient with "Living Better with Heart Failure" packet. Briefly reviewed definition of heart failure and signs and symptoms of an exacerbation. Discussed the meaning of EF with patient. Explained to patient that HF is a chronic illness which must be self-assessed / self-managed along with the help of theHF Clinic and cardiologist.  ? *Reviewed importance of and reason behind checking weight daily in the AM, after using the bathroom, but before getting dressed. Patient informed this RN that he will get scales.    Reviewed the following information with patient:  *Discussed when to call the Dr= weight gain of >3lb overnight of 5lb in a week,  *Discussed yellow zone= call MD: weight gain of >3lb overnight of 5lb in a week, increased swelling, increased SOB when lying down, chest discomfort, dizziness, increased fatigue *Red Zone= call 911: struggle to breath, fainting or near fainting, significant chest pain   ? *Reviewed low sodium diet-provided handout of recommended and not recommended foods. Reviewed reading labels with patient. Discussed fluid intake with patient as well. Patient not currently on a fluid restriction, but advised no more than 8-8 ounces glass of fluids per day. Dietitian has seen patient.    *Instructed patient to take  medications as prescribed for heart failure. Explained briefly why pt is on the medications (either make you feel better, live longer or keep you out of the hospital) and discussed monitoring and side effects.   *Smoking Cessation also discussed. Patient stated he has been smoking since the age of 1.  Patient is currently smoking 1/2 pack per day.  Information provided to patient on the effects of smoking on the body as well as benefits and changes that occur once one quits smoking. "Tips for Quitting" informational sheet provided and reviewed with patient. Information on Apps for Relaxation and Apps for Smoking Cessation as well as information on Quit Smart Classes provided to patient.    *Patient also advised to quit cocaine and quit drinking alcohol.     *Discussed exercise. Patient informed this RN that he is active, as he works 12 hour shifts at [employer] - three days on and three days off, but he does not exercise routinely.  Discussed Cardiac Rehab with patient.  Benefits of exercise explained to patient.  Benefits of participating in Cardiac Rehab program discussed with patient.  Overview of program provided.  Patient stated he is unable to participate given his work schedule.  Encourage patient to remain as active as he possibly can.   ? *Role of [facility] HF Clinic discussed. Heart Failure Clinic new patient appointment scheduled for September 16, 2017 at 9:40 a.m. This RN stressed the importance of keeping this appointment.  Explained to patient this clinic does not replace other providers, but is an additional resource in helping him manage his HF.    *Patient has a new patient appointment  scheduled with Dr. Mariah MillingGollan on October 27, 2017 at 9 a.m.  [facility]  [facility].    Army Meliaiane Dlynn Ranes, RN, BSN, [facility] Cardiovascular and Pulmonary Nurse Navigator

## 2017-09-08 NOTE — Progress Notes (Addendum)
MEDICATION RELATED CONSULT NOTE - INITIAL   Pharmacy Consult for Electrolyte Management Indication: hypomagnesium  No Known Allergies  Patient Measurements: Height: 5\' 10"  (177.8 cm) Weight: 184 lb 6.4 oz (83.6 kg) IBW/kg (Calculated) : 73 Adjusted Body Weight:  Vital Signs: Temp: 98 F (36.7 C) (11/27 0821) Temp Source: Oral (11/27 0821) BP: 127/88 (11/27 1248) Pulse Rate: 111 (11/27 1248) Intake/Output from previous day: 11/26 0701 - 11/27 0700 In: 603 [P.O.:600; I.V.:3] Out: 7500 [Urine:7500] Intake/Output from this shift: Total I/O In: 243 [P.O.:240; I.V.:3] Out: 1250 [Urine:1250]  Labs: Recent Labs    09/06/17 1825 09/06/17 1932 09/07/17 0027 09/07/17 0542 09/08/17 0559  WBC 5.4  --   --   --   --   HGB 14.9  --   --   --   --   HCT 45.2  --   --   --   --   PLT 154  --   --   --   --   CREATININE 1.20  --   --  1.12  --   MG  --   --  1.1*  --  0.7*  PHOS  --   --   --   --  3.7  ALBUMIN  --  3.7  --   --   --   PROT  --  8.1  --   --   --   AST  --  48*  --   --   --   ALT  --  18  --   --   --   ALKPHOS  --  107  --   --   --   BILITOT  --  1.7*  --   --   --   BILIDIR  --  0.7*  --   --   --   IBILI  --  1.0*  --   --   --    Estimated Creatinine Clearance: 74.2 mL/min (by C-G formula based on SCr of 1.12 mg/dL).   Microbiology: No results found for this or any previous visit (from the past 720 hour(s)).  Medical History: Past Medical History:  Diagnosis Date  . Asthma   . Hypertension     Assessment: Patient is a 56yo male with history of heavy alcohol abuse. Magnesium level of 0.7. Pharmacy consulted to manage electrolytes.  Goal of Therapy:  Maintain electrolytes within normal range  Plan:  Will give Magnesium Sulfate 4g IV x 2. Will recheck magnesium level at 23:00. Potassium level slightly low at 3.2, will give KCl 40 mEq PO x 1. Will follow up on AM labs.   Clovia Cuff, PharmD, BCPS 09/08/2017 1:22 PM     11/27 2300 Mg 1.9.  Recheck electrolytes with tomorrow AM labs.  Fulton Reek, PharmD, BCPS  09/09/17 1:30 AM

## 2017-09-09 LAB — BASIC METABOLIC PANEL
ANION GAP: 15 (ref 5–15)
BUN: 12 mg/dL (ref 6–20)
CALCIUM: 8.4 mg/dL — AB (ref 8.9–10.3)
CHLORIDE: 90 mmol/L — AB (ref 101–111)
CO2: 31 mmol/L (ref 22–32)
Creatinine, Ser: 1.18 mg/dL (ref 0.61–1.24)
GFR calc Af Amer: >60 mL/min (ref >60)
GFR calc non Af Amer: >60 mL/min (ref >60)
GLUCOSE: 115 mg/dL — AB (ref 65–99)
Potassium: 2.9 mmol/L — ABNORMAL LOW (ref 3.5–5.1)
Sodium: 136 mmol/L (ref 135–145)

## 2017-09-09 LAB — MAGNESIUM
Magnesium: 1.8 mg/dL (ref 1.7–2.4)
Magnesium: 1.9 mg/dL (ref 1.7–2.4)

## 2017-09-09 LAB — POTASSIUM: POTASSIUM: 3.5 mmol/L (ref 3.5–5.1)

## 2017-09-09 MED ORDER — POTASSIUM CHLORIDE CRYS ER 20 MEQ PO TBCR
40.0000 meq | EXTENDED_RELEASE_TABLET | Freq: Once | ORAL | Status: AC
  Administered 2017-09-09: 40 meq via ORAL
  Filled 2017-09-09: qty 2

## 2017-09-09 MED ORDER — THIAMINE HCL 100 MG PO TABS: 100.0000 mg | ORAL_TABLET | Freq: Every day | ORAL | 0 refills | Status: DC

## 2017-09-09 MED ORDER — ADULT MULTIVITAMIN W/MINERALS CH: 1.0000 | ORAL_TABLET | Freq: Every day | ORAL | 1 refills | Status: DC

## 2017-09-09 MED ORDER — ASPIRIN 81 MG PO TBEC: 81.0000 mg | DELAYED_RELEASE_TABLET | Freq: Every day | ORAL | 0 refills | Status: DC

## 2017-09-09 MED ORDER — FOLIC ACID 1 MG PO TABS: 1.0000 mg | ORAL_TABLET | Freq: Every day | ORAL | 0 refills | Status: DC

## 2017-09-09 MED ORDER — POTASSIUM CHLORIDE 10 MEQ/100ML IV SOLN
10.0000 meq | INTRAVENOUS | Status: DC
  Administered 2017-09-09: 10 meq via INTRAVENOUS
  Filled 2017-09-09 (×3): qty 100

## 2017-09-09 MED ORDER — FUROSEMIDE 40 MG PO TABS
40.0000 mg | ORAL_TABLET | Freq: Every day | ORAL | Status: DC
Start: 2017-09-09 — End: 2017-09-09
  Administered 2017-09-09: 40 mg via ORAL
  Filled 2017-09-09: qty 1

## 2017-09-09 MED ORDER — FUROSEMIDE 40 MG PO TABS: 40.0000 mg | ORAL_TABLET | Freq: Every day | ORAL | 1 refills | Status: DC

## 2017-09-09 MED ORDER — ATORVASTATIN CALCIUM 40 MG PO TABS: 40.0000 mg | ORAL_TABLET | Freq: Every day | ORAL | 0 refills | Status: DC

## 2017-09-09 NOTE — Discharge Summary (Signed)
[facility] [place] at [facility]   PATIENT NAME: Marcus Foster    MR#:  [id number]  DATE OF BIRTH:  Feb 11, 1961  DATE OF ADMISSION:  09/06/2017 ADMITTING PHYSICIAN: Marcus AsaMontell D Salary, MD  DATE OF DISCHARGE: 09/09/2017  PRIMARY CARE PHYSICIAN: Patient, No Pcp Per    ADMISSION DIAGNOSIS:  Shortness of breath [R06.02] Alcohol abuse [F10.10] Elevated troponin I level [R74.8] Edema, unspecified type [R60.9]  DISCHARGE DIAGNOSIS:  Acute Systolic CHF suspected ETOH induced ETOH abuse Cocaine abuse  SECONDARY DIAGNOSIS:   Past Medical History:  Diagnosis Date  . Asthma   . Hypertension     HOSPITAL COURSE:   [name] a56 y.o.malewith a known history ofchildhood asthma, esophageal stricture, dysphagia, diverticulosis, heavy alcohol abuse, chronic tobacco smoking abuse, chronic cocaine abuse-last use 3 days ago presents to the emergency room with 10-3274-month history of worsening cough, shortness of breath, dyspnea on exertion, facial as well as lower extremity swelling, difficulty breathing when recumbent, in the emergency room patient was found to have alcohol level 270  1acute  newly diagnosed   SYSTOLIC congestive heart failure exacerbation -Most likely secondary to multifactorial process which includes chronic cocaine and ETOH  abuse-last use 3 days ago, alcoholism, and chronic tobacco smoking  - IV Lasix twice daily--change to po lasix. UOP >10 liters since admission - aspirin daily, lisinopril, avoid beta-blocker therapy given chronic cocaine abuse, statin -ECHO shows EF 25-30% with no WMA  2acute alcohol intoxication with chronic alcoholism Drinks 1/2-1 pint of liquor daily, no interest in quitting Will place on alcohol withdrawal protocol, seizure/fall precautions -no s/o withdrawal -received IV thiamine (?wet Beri beri) and po folate 3chronic cocaine abuse Last used 3 days ago Patient advised to quit  4chronic tobacco smoking  abuse/dependency Nicotine patch daily and cessation counseling ordered  5chronic benign essential hypertension Stable Continue lisinopril - IV hydralazine as needed systolic blood pressure greater than 160 -Amlodipine d/ced due to leg swelling  Overall stable for d/c Unable to reach family with ph nos in the computer.  CONSULTS OBTAINED:    DRUG ALLERGIES:  No Known Allergies  DISCHARGE MEDICATIONS:   Current Discharge Medication List    START taking these medications   Details  aspirin EC 81 MG EC tablet Take 1 tablet (81 mg total) by mouth daily. Qty: 30 tablet, Refills: 0    atorvastatin (LIPITOR) 40 MG tablet Take 1 tablet (40 mg total) by mouth daily at 6 PM. Qty: 30 tablet, Refills: 0    folic acid (FOLVITE) 1 MG tablet Take 1 tablet (1 mg total) by mouth daily. Qty: 30 tablet, Refills: 0    furosemide (LASIX) 40 MG tablet Take 1 tablet (40 mg total) by mouth daily. Qty: 30 tablet, Refills: 1    Multiple Vitamin (MULTIVITAMIN WITH MINERALS) TABS tablet Take 1 tablet by mouth daily. Qty: 30 tablet, Refills: 1    thiamine 100 MG tablet Take 1 tablet (100 mg total) by mouth daily. Qty: 30 tablet, Refills: 0      CONTINUE these medications which have NOT CHANGED   Details  cholecalciferol (VITAMIN D) 1000 units tablet Take 1,000 Units by mouth daily.    lisinopril (PRINIVIL,ZESTRIL) 10 MG tablet Take 30 mg by mouth daily.      STOP taking these medications     amLODipine-atorvastatin (CADUET) 10-40 MG tablet         If you experience worsening of your admission symptoms, develop shortness of breath, life threatening emergency, suicidal or homicidal  thoughts you must seek medical attention immediately by calling 911 or calling your MD immediately  if symptoms less severe.  You Must read complete instructions/literature along with all the possible adverse reactions/side effects for all the Medicines you take and that have been prescribed to you. Take any  new Medicines after you have completely understood and accept all the possible adverse reactions/side effects.   Please note  You were cared for by a hospitalist during your hospital stay. If you have any questions about your discharge medications or the care you received while you were in the hospital after you are discharged, you can call the unit and asked to speak with the hospitalist on call if the hospitalist that took care of you is not available. Once you are discharged, your primary care physician will handle any further medical issues. Please note that NO REFILLS for any discharge medications will be authorized once you are discharged, as it is imperative that you return to your primary care physician (or establish a relationship with a primary care physician if you do not have one) for your aftercare needs so that they can reassess your need for medications and monitor your lab values. Today   SUBJECTIVE   Doing OK  VITAL SIGNS:  Blood pressure 108/78, pulse 100, temperature 97.9 F (36.6 C), resp. rate 18, height 5\' 10"  (1.778 m), weight 82.9 kg (182 lb 12.8 oz), SpO2 95 %.  I/O:    Intake/Output Summary (Last 24 hours) at 09/09/2017 0805 Last data filed at 09/09/2017 6578 Gross per 24 hour  Intake 1023 ml  Output 2125 ml  Net -1102 ml    PHYSICAL EXAMINATION:  GENERAL:  56 y.o.-year-old patient lying in the bed with no acute distress.  EYES: Pupils equal, round, reactive to light and accommodation. No scleral icterus. Extraocular muscles intact.  HEENT: Head atraumatic, normocephalic. Oropharynx and nasopharynx clear.  NECK:  Supple, no jugular venous distention. No thyroid enlargement, no tenderness.  LUNGS: Normal breath sounds bilaterally, no wheezing, rales,rhonchi or crepitation. No use of accessory muscles of respiration.  CARDIOVASCULAR: S1, S2 normal. No murmurs, rubs, or gallops.  ABDOMEN: Soft, non-tender, non-distended. Bowel sounds present. No organomegaly or  mass.  EXTREMITIES: No pedal edema, cyanosis, or clubbing.  NEUROLOGIC: Cranial nerves II through XII are intact. Muscle strength 5/5 in all extremities. Sensation intact. Gait not checked.  PSYCHIATRIC: The patient is alert and oriented x 3.  SKIN: No obvious rash, lesion, or ulcer.   DATA REVIEW:   CBC  Recent Labs  Lab 09/06/17 1825  WBC 5.4  HGB 14.9  HCT 45.2  PLT 154    Chemistries  Recent Labs  Lab 09/06/17 1932  09/09/17 0508  NA  --    < > 136  K  --    < > 2.9*  CL  --    < > 90*  CO2  --    < > 31  GLUCOSE  --    < > 115*  BUN  --    < > 12  CREATININE  --    < > 1.18  CALCIUM  --    < > 8.4*  MG  --    < > 1.8  AST 48*  --   --   ALT 18  --   --   ALKPHOS 107  --   --   BILITOT 1.7*  --   --    < > = values in this interval not  displayed.    Microbiology Results   No results found for this or any previous visit (from the past 240 hour(s)).  RADIOLOGY:  No results found.   Management plans discussed with the patient, family and they are in agreement.  CODE STATUS:     Code Status Orders  (From admission, onward)        Start     Ordered   09/07/17 0002  Full code  Continuous     09/07/17 0001    Code Status History    Date Active Date Inactive Code Status Order ID Comments User Context   This patient has a current code status but no historical code status.      TOTAL TIME TAKING CARE OF THIS PATIENT: *40* minutes.    Enedina Finner M.D on 09/09/2017 at 8:05 AM  Between 7am to 6pm - Pager - [phone] After 6pm go to [email or web address] - password [employer]  [facility] [place] [facility]  Office  [phone]  CC: Primary care physician; Patient, No Pcp Per

## 2017-09-09 NOTE — Progress Notes (Signed)
Patient discharged via wheelchair and private vehicle. IV removed and catheter intact. All discharge instructions given and patient verbalizes understanding. Tele removed and returned. No prescriptions given to patient No distress noted.   

## 2017-09-09 NOTE — Progress Notes (Signed)
[facility]    Marcus Foster was admitted to the Hospital on 09/06/2017 and Discharged  09/09/2017 and should be excused from work/school   for *4 days starting 09/06/2017 , may return to work/school without any restrictions.  Call Enedina Finner MD, [facility]  [phone] with questions.  Enedina Finner M.D on 09/09/2017,at 2:23 PM

## 2017-09-09 NOTE — Progress Notes (Signed)
Patients family mother called from [place] and expressed her concern that her son is still coughing and she feels like he need some antibiotics and that she doesn't think he should go home. Dr. Allena Katz Notified of this concern. Stated that his chest xray does not show anything concerning for the need of antibiotics and that the patient does not have a fever. Dr. Allena Katz stated to give patient some Robitussin and discharge patient with his follow up at the heart failure clinic. Patient agrees with plan. Will proceed.

## 2017-09-09 NOTE — Discharge Instructions (Addendum)
Alcohol Abuse and Nutrition Alcohol abuse is any pattern of alcohol consumption that harms your health, relationships, or work. Alcohol abuse can affect how your body breaks down and absorbs nutrients from food by causing your liver to work abnormally. Additionally, many people who abuse alcohol do not eat enough carbohydrates, protein, fat, vitamins, and minerals. This can cause poor nutrition (malnutrition) and a lack of nutrients (nutrient deficiencies), which can lead to further complications. Nutrients that are commonly lacking (deficient) among people who abuse alcohol include:  Vitamins. ? Vitamin A. This is stored in your liver. It is important for your vision, metabolism, and ability to fight off infections (immunity). ? B vitamins. These include vitamins such as folate, thiamin, and niacin. These are important in new cell growth and maintenance. ? Vitamin C. This plays an important role in iron absorption, wound healing, and immunity. ? Vitamin D. This is produced by your liver, but you can also get vitamin D from food. Vitamin D is necessary for your body to absorb and use calcium.  Minerals. ? Calcium. This is important for your bones and your heart and blood vessel (cardiovascular) function. ? Iron. This is important for blood, muscle, and nervous system functioning. ? Magnesium. This plays an important role in muscle and nerve function, and it helps to control blood sugar and blood pressure. ? Zinc. This is important for the normal function of your nervous system and digestive system (gastrointestinal tract).  Nutrition is an essential component of therapy for alcohol abuse. Your health care provider or dietitian will work with you to design a plan that can help restore nutrients to your body and prevent potential complications. What is my plan? Your dietitian may develop a specific diet plan that is based on your condition and any other complications you may have. A diet plan will  commonly include:  A balanced diet. ? Grains: 6-8 oz per day. ? Vegetables: 2-3 cups per day. ? Fruits: 1-2 cups per day. ? Meat and other protein: 5-6 oz per day. ? Dairy: 2-3 cups per day.  Vitamin and mineral supplements.  What do I need to know about alcohol and nutrition?  Consume foods that are high in antioxidants, such as grapes, berries, nuts, green tea, and dark green and orange vegetables. This can help to counteract some of the stress that is placed on your liver by consuming alcohol.  Avoid food and drinks that are high in fat and sugar. Foods such as sugared soft drinks, salty snack foods, and candy contain empty calories. This means that they lack important nutrients such as protein, fiber, and vitamins.  Eat frequent meals and snacks. Try to eat 5-6 small meals each day.  Eat a variety of fresh fruits and vegetables each day. This will help you get plenty of water, fiber, and vitamins in your diet.  Drink plenty of water and other clear fluids. Try to drink at least 48-64 oz (1.5-2 L) of water per day.  If you are a vegetarian, eat a variety of protein-rich foods. Pair whole grains with plant-based proteins at meals and snacks to obtain the greatest nutrient benefit from your food. For example, eat rice with beans, put peanut butter on whole-grain toast, or eat oatmeal with sunflower seeds.  Soak beans and whole grains overnight before cooking. This can help your body to absorb the nutrients more easily.  Include foods fortified with vitamins and minerals in your diet. Commonly fortified foods include milk, orange juice, cereal, and bread.  If you are malnourished, your dietitian may recommend a high-protein, high-calorie diet. This may include: ? 2,000-3,000 calories (kilocalories) per day. ? 70-100 grams of protein per day.  Your health care provider may recommend a complete nutritional supplement beverage. This can help to restore calories, protein, and vitamins to  your body. Depending on your condition, you may be advised to consume this instead of or in addition to meals.  Limit your intake of caffeine. Replace drinks like coffee and black tea with decaffeinated coffee and herbal tea.  Eat a variety of foods that are high in omega fatty acids. These include fish, nuts and seeds, and soybeans. These foods may help your liver to recover and may also stabilize your mood.  Certain medicines may cause changes in your appetite, taste, and weight. Work with your health care provider and dietitian to make any adjustments to your medicines and diet plan.  Include other healthy lifestyle choices in your daily routine. ? Be physically active. ? Get enough sleep. ? Spend time doing activities that you enjoy.  If you are unable to take in enough food and calories by mouth, your health care provider may recommend a feeding tube. This is a tube that passes through your nose and throat, directly into your stomach. Nutritional supplement beverages can be given to you through the feeding tube to help you get the nutrients you need.  Take vitamin or mineral supplements as recommended by your health care provider. What foods can I eat? Grains Enriched pasta. Enriched rice. Fortified whole-grain bread. Fortified whole-grain cereal. Barley. Brown rice. Quinoa. [place]. Vegetables All fresh, frozen, and canned vegetables. Spinach. Kale. Artichoke. Carrots. Winter squash and pumpkin. Sweet potatoes. Broccoli. Cabbage. Cucumbers. Tomatoes. Sweet peppers. Green beans. Peas. Corn. Fruits All fresh and frozen fruits. Berries. Grapes. Mango. Papaya. Guava. Cherries. Apples. Bananas. Peaches. Plums. Pineapple. Watermelon. Cantaloupe. Oranges. Avocado. Meats and Other Protein Sources Beef liver. Lean beef. Pork. Fresh and canned chicken. Fresh fish. Oysters. Sardines. Canned tuna. Shrimp. Eggs with yolks. Nuts and seeds. Peanut butter. Beans and lentils. Soybeans.  Tofu. Dairy Whole, low-fat, and nonfat milk. Whole, low-fat, and nonfat yogurt. Cottage cheese. Sour cream. Hard and soft cheeses. Beverages Water. Herbal tea. Decaffeinated coffee. Decaffeinated green tea. 100% fruit juice. 100% vegetable juice. Instant breakfast shakes. Condiments Ketchup. Mayonnaise. Mustard. Salad dressing. Barbecue sauce. Sweets and Desserts Sugar-free ice cream. Sugar-free pudding. Sugar-free gelatin. Fats and Oils Butter. Vegetable oil, flaxseed oil, olive oil, and walnut oil. Other Complete nutrition shakes. Protein bars. Sugar-free gum. The items listed above may not be a complete list of recommended foods or beverages. Contact your dietitian for more options. What foods are not recommended? Grains Sugar-sweetened breakfast cereals. Flavored instant oatmeal. Fried breads. Vegetables Breaded or deep-fried vegetables. Fruits Dried fruit with added sugar. Candied fruit. Canned fruit in syrup. Meats and Other Protein Sources Breaded or deep-fried meats. Dairy Flavored milks. Fried cheese curds or fried cheese sticks. Beverages Alcohol. Sugar-sweetened soft drinks. Sugar-sweetened tea. Caffeinated coffee and tea. Condiments Sugar. Honey. Agave nectar. Molasses. Sweets and Desserts Chocolate. Cake. Cookies. Candy. Other Potato chips. Pretzels. Salted nuts. Candied nuts. The items listed above may not be a complete list of foods and beverages to avoid. Contact your dietitian for more information. This information is not intended to replace advice given to you by your health care provider. Make sure you discuss any questions you have with your health care provider. Document Released: 07/24/2005 Document Revised: 02/06/2016 Document Reviewed: 05/02/2014 Elsevier Interactive Patient Education  2018 [employer]   Cough, Adult A cough helps to clear your throat and lungs. A cough may last only 2-3 weeks (acute), or it may last longer than 8 weeks (chronic).  Many different things can cause a cough. A cough may be a sign of an illness or another medical condition. Follow these instructions at home:  Pay attention to any changes in your cough.  Take medicines only as told by your doctor. ? If you were prescribed an antibiotic medicine, take it as told by your doctor. Do not stop taking it even if you start to feel better. ? Talk with your doctor before you try using a cough medicine.  Drink enough fluid to keep your pee (urine) clear or pale yellow.  If the air is dry, use a cold steam vaporizer or humidifier in your home.  Stay away from things that make you cough at work or at home.  If your cough is worse at night, try using extra pillows to raise your head up higher while you sleep.  Do not smoke, and try not to be around smoke. If you need help quitting, ask your doctor.  Do not have caffeine.  Do not drink alcohol.  Rest as needed. Contact a doctor if:  You have new problems (symptoms).  You cough up yellow fluid (pus).  Your cough does not get better after 2-3 weeks, or your cough gets worse.  Medicine does not help your cough and you are not sleeping well.  You have pain that gets worse or pain that is not helped with medicine.  You have a fever.  You are losing weight and you do not know why.  You have night sweats. Get help right away if:  You cough up blood.  You have trouble breathing.  Your heartbeat is very fast. This information is not intended to replace advice given to you by your health care provider. Make sure you discuss any questions you have with your health care provider. Document Released: 06/12/2011 Document Revised: 03/06/2016 Document Reviewed: 12/06/2014 Elsevier Interactive Patient Education  2018 [employer]  Heart Failure Heart failure means your heart has trouble pumping blood. This makes it hard for your body to work well. Heart failure is usually a [place]-term (chronic) condition. You  must take good care of yourself and follow your doctor's treatment plan. Follow these instructions at home:  Take your heart medicine as told by your doctor. ? Do not stop taking medicine unless your doctor tells you to. ? Do not skip any dose of medicine. ? Refill your medicines before they run out. ? Take other medicines only as told by your doctor or pharmacist.  Stay active if told by your doctor. The elderly and people with severe heart failure should talk with a doctor about physical activity.  Eat heart-healthy foods. Choose foods that are without trans fat and are low in saturated fat, cholesterol, and salt (sodium). This includes fresh or frozen fruits and vegetables, fish, lean meats, fat-free or low-fat dairy foods, whole grains, and high-fiber foods. Lentils and dried peas and beans (legumes) are also good choices.  Limit salt if told by your doctor.  Cook in a healthy way. Roast, grill, broil, bake, poach, steam, or stir-fry foods.  Limit fluids as told by your doctor.  Weigh yourself every morning. Do this after you pee (urinate) and before you eat breakfast. Write down your weight to give to your doctor.  Take your blood pressure and write  it down if your doctor tells you to.  Ask your doctor how to check your pulse. Check your pulse as told.  Lose weight if told by your doctor.  Stop smoking or chewing tobacco. Do not use gum or patches that help you quit without your doctor's approval.  Schedule and go to doctor visits as told.  Nonpregnant women should have no more than 1 drink a day. Men should have no more than 2 drinks a day. Talk to your doctor about drinking alcohol.  Stop illegal drug use.  Stay current with shots (immunizations).  Manage your health conditions as told by your doctor.  Learn to manage your stress.  Rest when you are tired.  If it is really hot outside: ? Avoid intense activities. ? Use air conditioning or fans, or get in a cooler  place. ? Avoid caffeine and alcohol. ? Wear loose-fitting, lightweight, and light-colored clothing.  If it is really cold outside: ? Avoid intense activities. ? Layer your clothing. ? Wear mittens or gloves, a hat, and a scarf when going outside. ? Avoid alcohol.  Learn about heart failure and get support as needed.  Get help to maintain or improve your quality of life and your ability to care for yourself as needed. Contact a doctor if:  You gain weight quickly.  You are more short of breath than usual.  You cannot do your normal activities.  You tire easily.  You cough more than normal, especially with activity.  You have any or more puffiness (swelling) in areas such as your hands, feet, ankles, or belly (abdomen).  You cannot sleep because it is hard to breathe.  You feel like your heart is beating fast (palpitations).  You get dizzy or light-headed when you stand up. Get help right away if:  You have trouble breathing.  There is a change in mental status, such as becoming less alert or not being able to focus.  You have chest pain or discomfort.  You faint. This information is not intended to replace advice given to you by your health care provider. Make sure you discuss any questions you have with your health care provider. Document Released: 07/08/2008 Document Revised: 03/06/2016 Document Reviewed: 11/15/2012 Elsevier Interactive Patient Education  2017 [employer]   Heart Failure Clinic appointment on September 16 2017 at 9:40am with Clarisa Kindred, FNP. Please call [phone] to reschedule.   STOP drinking alcohol and DO NOT USE COCAINE

## 2017-09-10 LAB — VITAMIN B1: VITAMIN B1 (THIAMINE): 85.5 nmol/L (ref 66.5–200.0)

## 2017-09-16 ENCOUNTER — Other Ambulatory Visit: Payer: Self-pay

## 2017-09-16 ENCOUNTER — Ambulatory Visit: Payer: BLUE CROSS/BLUE SHIELD | Attending: Family | Admitting: Family

## 2017-09-16 ENCOUNTER — Encounter: Payer: Self-pay | Admitting: Family

## 2017-09-16 VITALS — BP 130/94 | HR 109 | Resp 18 | Ht 70.0 in | Wt 196.0 lb

## 2017-09-16 DIAGNOSIS — Z9889 Other specified postprocedural states: Secondary | ICD-10-CM | POA: Diagnosis not present

## 2017-09-16 DIAGNOSIS — I509 Heart failure, unspecified: Secondary | ICD-10-CM | POA: Diagnosis not present

## 2017-09-16 DIAGNOSIS — I890 Lymphedema, not elsewhere classified: Secondary | ICD-10-CM | POA: Diagnosis not present

## 2017-09-16 DIAGNOSIS — F1910 Other psychoactive substance abuse, uncomplicated: Secondary | ICD-10-CM | POA: Insufficient documentation

## 2017-09-16 DIAGNOSIS — F17210 Nicotine dependence, cigarettes, uncomplicated: Secondary | ICD-10-CM | POA: Insufficient documentation

## 2017-09-16 DIAGNOSIS — I10 Essential (primary) hypertension: Secondary | ICD-10-CM

## 2017-09-16 DIAGNOSIS — Z7982 Long term (current) use of aspirin: Secondary | ICD-10-CM | POA: Insufficient documentation

## 2017-09-16 DIAGNOSIS — I110 Hypertensive heart disease with heart failure: Secondary | ICD-10-CM | POA: Diagnosis not present

## 2017-09-16 DIAGNOSIS — Z79899 Other long term (current) drug therapy: Secondary | ICD-10-CM | POA: Diagnosis not present

## 2017-09-16 DIAGNOSIS — I5022 Chronic systolic (congestive) heart failure: Secondary | ICD-10-CM

## 2017-09-16 MED ORDER — SACUBITRIL-VALSARTAN 24-26 MG PO TABS: 1.0000 | ORAL_TABLET | Freq: Two times a day (BID) | ORAL | 0 refills | Status: DC

## 2017-09-16 NOTE — Progress Notes (Signed)
Patient ID: Marcus Foster, male    DOB: 17-Feb-1961, 56 y.o.   MRN: [id number]  HPI  Mr Jacqulyn BathLong is a 56 y/o male with a history of asthma, HTN, current tobacco/etoh use, prior cocaine use and chronic heart failure.   Echo report from 09/07/17 reviewed and showed an EF of 20-25% along with moderate MR.  Admitted 09/06/17 due to newly diagnosed HF most likely related to chronic cocaine/ETOH abuse. Initially needed IV lasix with transition to oral diuretics. Urine output was >10 L during the admission. Medications were adjusted and he was discharged home after 3 days.  He presents today for his initial visit with a chief complaint of mild shortness of breath upon moderate exertion. He describes this as chronic in nature having been present for several months although does feel like it's improving. He has associated fatigue, cough, edema and difficulty sleeping. He denies chest pain, palpitations or dizziness.   Past Medical History:  Diagnosis Date  . Asthma   . Hypertension    Past Surgical History:  Procedure Laterality Date  . COLONOSCOPY    . ESOPHAGOGASTRODUODENOSCOPY     History reviewed. No pertinent family history. Social History   Tobacco Use  . Smoking status: Current Every Day Smoker  . Smokeless tobacco: Never Used  Substance Use Topics  . Alcohol use: Yes    Comment: "couple shots a day" and beer   No Known Allergies Prior to Admission medications   Medication Sig Start Date End Date Taking? Authorizing Provider  atorvastatin (LIPITOR) 40 MG tablet Take 1 tablet (40 mg total) by mouth daily at 6 PM. 09/09/17  Yes Enedina FinnerPatel, Sona, MD  folic acid (FOLVITE) 1 MG tablet Take 1 tablet (1 mg total) by mouth daily. 09/09/17  Yes Enedina FinnerPatel, Sona, MD  furosemide (LASIX) 40 MG tablet Take 1 tablet (40 mg total) by mouth daily. 09/09/17  Yes Enedina FinnerPatel, Sona, MD  lisinopril (PRINIVIL,ZESTRIL) 10 MG tablet Take 30 mg by mouth daily. 05/24/15  Yes [provider]  Multiple Vitamin  (MULTIVITAMIN WITH MINERALS) TABS tablet Take 1 tablet by mouth daily. 09/09/17  Yes Enedina FinnerPatel, Sona, MD  thiamine 100 MG tablet Take 1 tablet (100 mg total) by mouth daily. 09/11/17  Yes Enedina FinnerPatel, Sona, MD  aspirin EC 81 MG EC tablet Take 1 tablet (81 mg total) by mouth daily. Patient not taking: Reported on 09/16/2017 09/09/17   Enedina FinnerPatel, Sona, MD  cholecalciferol (VITAMIN D) 1000 units tablet Take 1,000 Units by mouth daily.    [provider]    Review of Systems  Constitutional: Positive for fatigue. Negative for appetite change and fever.  HENT: Positive for congestion and rhinorrhea. Negative for sore throat.   Eyes: Negative.   Respiratory: Positive for cough and shortness of breath. Negative for chest tightness.   Cardiovascular: Positive for leg swelling (minimal in the mornings). Negative for chest pain and palpitations.  Gastrointestinal: Negative for abdominal distention and abdominal pain.  Endocrine: Negative.   Genitourinary: Negative.   Musculoskeletal: Negative for back pain and neck pain.  Skin: Negative.   Allergic/Immunologic: Negative.   Neurological: Negative for dizziness and light-headedness.  Hematological: Negative for adenopathy. Does not bruise/bleed easily.  Psychiatric/Behavioral: Positive for sleep disturbance (sleeping on 1-2 pillows). Negative for dysphoric mood. The patient is not nervous/anxious.    Vitals:   09/16/17 0921  BP: (!) 130/94  Pulse: (!) 109  Resp: 18  SpO2: 100%  Weight: 196 lb (88.9 kg)  Height: 5\' 10"  (1.778 m)  Wt Readings from Last 3 Encounters:  09/16/17 196 lb (88.9 kg)  09/09/17 182 lb 12.8 oz (82.9 kg)   Lab Results  Component Value Date   CREATININE 1.18 09/09/2017   CREATININE 1.12 09/07/2017   CREATININE 1.20 09/06/2017   Physical Exam  Constitutional: He is oriented to person, place, and time. He appears well-developed and well-nourished.  HENT:  Head: Normocephalic and atraumatic.  Neck: Normal range of  motion. Neck supple. No JVD present.  Cardiovascular: Regular rhythm. Tachycardia present.  Pulmonary/Chest: Effort normal. He has no wheezes. He has no rales.  Abdominal: Soft. He exhibits no distension. There is no tenderness.  Musculoskeletal: He exhibits edema (1+ pitting edema in bilateral lower legs). He exhibits no tenderness.  Neurological: He is alert and oriented to person, place, and time.  Skin: Skin is warm and dry.  Psychiatric: He has a normal mood and affect. His behavior is normal. Thought content normal.  Nursing note and vitals reviewed.  Assessment & Plan:  1: Chronic heart failure with reduced ejection fraction- - NYHA class II - mildly fluid overloaded today - already weighing daily. He was instructed to call for an overnight weight gain of >2 pounds or a weekly weight gain of >5 pounds - does add "some" salt to his food. Explained the importance of closely following a 2000mg  sodium diet and written dietary information was given to him about this. - reports receiving his flu vaccine for this season already - will change his lisinopril to entresto. He has taken his lisinopril today so he was instructed to not take anymore lisinopri and begin entresto 24/26mg  twice daily beginning on 09/18/17 - sees cardiologist Mariah Milling) 10/27/16 and will ask him to draw a BMP - discussed carvedilol use but advised him that a drug screen would have to be done to make sure he doesn't test positive for cocaine  2: HTN- - sees PCP Clint Guy) 09/18/17 - BMP from 09/09/17 reviewed and showed sodium 136, potassium 3.5 and GFR >60  3: Substance abuse- - currently smoking 2 cigarettes daily along with 2 shots of liquor daily (down from 1 pint daily) - denies any cocaine use in the last 3 weeks - complete cessation of all above substances discussed for 3 minutes with him  4: Lymphedema- - stage 2 - doesn't elevate his legs much during the day as he is still working and works 12 hour shifts -  encouraged him to elevate them on his off days - also instructed him to get TED hose and wear them daily with removal at bedtime - could consider lymphapress compression boots if edema persists  Medication bottles were reviewed.  Return here in 2 months or sooner for any questions/problems before then.

## 2017-09-16 NOTE — Patient Instructions (Addendum)
Continue weighing daily and call for an overnight weight gain of > 2 pounds or a weekly weight gain of >5 pounds. Make sure you weigh with the same amount of clothes on.   Do not take anymore lisinopril and you will begin entresto 24/26mg  twice daily.     Low-Sodium Eating Plan Sodium, which is an element that makes up salt, helps you maintain a healthy balance of fluids in your body. Too much sodium can increase your blood pressure and cause fluid and waste to be held in your body. Your health care provider or dietitian may recommend following this plan if you have high blood pressure (hypertension), kidney disease, liver disease, or heart failure. Eating less sodium can help lower your blood pressure, reduce swelling, and protect your heart, liver, and kidneys. What are tips for following this plan? General guidelines  Most people on this plan should limit their sodium intake to 1,500-2,000 mg (milligrams) of sodium each day. Reading food labels  The Nutrition Facts label lists the amount of sodium in one serving of the food. If you eat more than one serving, you must multiply the listed amount of sodium by the number of servings.  Choose foods with less than 140 mg of sodium per serving.  Avoid foods with 300 mg of sodium or more per serving. Shopping  Look for lower-sodium products, often labeled as "low-sodium" or "no salt added."  Always check the sodium content even if foods are labeled as "unsalted" or "no salt added".  Buy fresh foods. ? Avoid canned foods and premade or frozen meals. ? Avoid canned, cured, or processed meats  Buy breads that have less than 80 mg of sodium per slice. Cooking  Eat more home-cooked food and less restaurant, buffet, and fast food.  Avoid adding salt when cooking. Use salt-free seasonings or herbs instead of table salt or sea salt. Check with your health care provider or pharmacist before using salt substitutes.  Cook with plant-based oils,  such as canola, sunflower, or olive oil. Meal planning  When eating at a restaurant, ask that your food be prepared with less salt or no salt, if possible.  Avoid foods that contain MSG (monosodium glutamate). MSG is sometimes added to [place] food, bouillon, and some canned foods. What foods are recommended? The items listed may not be a complete list. Talk with your dietitian about what dietary choices are best for you. Grains Low-sodium cereals, including oats, puffed wheat and rice, and shredded wheat. Low-sodium crackers. Unsalted rice. Unsalted pasta. Low-sodium bread. Whole-grain breads and whole-grain pasta. Vegetables Fresh or frozen vegetables. "No salt added" canned vegetables. "No salt added" tomato sauce and paste. Low-sodium or reduced-sodium tomato and vegetable juice. Fruits Fresh, frozen, or canned fruit. Fruit juice. Meats and other protein foods Fresh or frozen (no salt added) meat, poultry, seafood, and fish. Low-sodium canned tuna and salmon. Unsalted nuts. Dried peas, beans, and lentils without added salt. Unsalted canned beans. Eggs. Unsalted nut butters. Dairy Milk. Soy milk. Cheese that is naturally low in sodium, such as ricotta cheese, fresh mozzarella, or Swiss cheese Low-sodium or reduced-sodium cheese. Cream cheese. Yogurt. Fats and oils Unsalted butter. Unsalted margarine with no trans fat. Vegetable oils such as canola or olive oils. Seasonings and other foods Fresh and dried herbs and spices. Salt-free seasonings. Low-sodium mustard and ketchup. Sodium-free salad dressing. Sodium-free light mayonnaise. Fresh or refrigerated horseradish. Lemon juice. Vinegar. Homemade, reduced-sodium, or low-sodium soups. Unsalted popcorn and pretzels. Low-salt or salt-free chips. What foods  are not recommended? The items listed may not be a complete list. Talk with your dietitian about what dietary choices are best for you. Grains Instant hot cereals. Bread stuffing, pancake,  and biscuit mixes. Croutons. Seasoned rice or pasta mixes. Noodle soup cups. Boxed or frozen macaroni and cheese. Regular salted crackers. Self-rising flour. Vegetables Sauerkraut, pickled vegetables, and relishes. Olives. [place] fries. Onion rings. Regular canned vegetables (not low-sodium or reduced-sodium). Regular canned tomato sauce and paste (not low-sodium or reduced-sodium). Regular tomato and vegetable juice (not low-sodium or reduced-sodium). Frozen vegetables in sauces. Meats and other protein foods Meat or fish that is salted, canned, smoked, spiced, or pickled. Bacon, ham, sausage, hotdogs, corned beef, chipped beef, packaged lunch meats, salt pork, jerky, pickled herring, anchovies, regular canned tuna, sardines, salted nuts. Dairy Processed cheese and cheese spreads. Cheese curds. Blue cheese. Feta cheese. String cheese. Regular cottage cheese. Buttermilk. Canned milk. Fats and oils Salted butter. Regular margarine. Ghee. Bacon fat. Seasonings and other foods Onion salt, garlic salt, seasoned salt, table salt, and sea salt. Canned and packaged gravies. Worcestershire sauce. Tartar sauce. Barbecue sauce. Teriyaki sauce. Soy sauce, including reduced-sodium. Steak sauce. Fish sauce. Oyster sauce. Cocktail sauce. Horseradish that you find on the shelf. Regular ketchup and mustard. Meat flavorings and tenderizers. Bouillon cubes. Hot sauce and Tabasco sauce. Premade or packaged marinades. Premade or packaged taco seasonings. Relishes. Regular salad dressings. Salsa. Potato and tortilla chips. Corn chips and puffs. Salted popcorn and pretzels. Canned or dried soups. Pizza. Frozen entrees and pot pies. Summary  Eating less sodium can help lower your blood pressure, reduce swelling, and protect your heart, liver, and kidneys.  Most people on this plan should limit their sodium intake to 1,500-2,000 mg (milligrams) of sodium each day.  Canned, boxed, and frozen foods are high in sodium.  Restaurant foods, fast foods, and pizza are also very high in sodium. You also get sodium by adding salt to food.  Try to cook at home, eat more fresh fruits and vegetables, and eat less fast food, canned, processed, or prepared foods. This information is not intended to replace advice given to you by your health care provider. Make sure you discuss any questions you have with your health care provider. Document Released: 03/21/2002 Document Revised: 09/22/2016 Document Reviewed: 09/22/2016 Elsevier Interactive Patient Education  2017 [employer].

## 2017-09-24 ENCOUNTER — Telehealth: Payer: Self-pay | Admitting: *Deleted

## 2017-09-24 NOTE — Telephone Encounter (Signed)
-----   Message from Delma Freeze, FNP sent at 09/16/2017  8:21 PM EST ----- Regarding: BMP This patient sees Dr. Mariah Milling on 1/15.  I started him on entresto and was wondering if Dr. Mariah Milling would check a BMP at that visit.   Thanks, Inetta Fermo

## 2017-10-24 DIAGNOSIS — I428 Other cardiomyopathies: Secondary | ICD-10-CM | POA: Insufficient documentation

## 2017-10-24 DIAGNOSIS — I5022 Chronic systolic (congestive) heart failure: Secondary | ICD-10-CM | POA: Insufficient documentation

## 2017-10-24 DIAGNOSIS — F1410 Cocaine abuse, uncomplicated: Secondary | ICD-10-CM | POA: Insufficient documentation

## 2017-10-24 DIAGNOSIS — F17200 Nicotine dependence, unspecified, uncomplicated: Secondary | ICD-10-CM | POA: Insufficient documentation

## 2017-10-24 DIAGNOSIS — F1010 Alcohol abuse, uncomplicated: Secondary | ICD-10-CM | POA: Insufficient documentation

## 2017-10-24 NOTE — Progress Notes (Signed)
Cardiology Office Note  Date:  10/27/2017   ID:  VALENTINO SAAVEDRA, DOB 12-10-1960, MRN [id number]  PCP:  Armando Gang, FNP   Chief Complaint  Patient presents with  . other    Sob and Edema. Meds reviewed verbally with pt.    HPI:  Marcus Foster a56 y.o.malewith a history of childhood asthma,  esophageal stricture, dysphagia, diverticulosis,  heavy alcohol abuse,  chronic tobacco smoking abuse,  chronic cocaine abuse, recent abuse Presenting to the hospital November 2018 with worsening cough, shortness of breath, dyspnea on exertion,  alcohol level 270 Diagnosed with cardiomyopathy ejection fraction 25-30% felt secondary to alcohol abuse, chronic hypertension Presents to establish care in the office, management of his cardiomyopathy, moderate mitral valve regurg   Seen in the CHF clinic September 16, 2017 Hospital records reviewed personally by myself Diuresed greater than 10 L on admission November 2018  On today's visit he has severe swelling up to his scrotum He has PND and orthopnea He does not have Lasix, only taking entresto (using samples from CHF clinic) reports he has no refills for any of his other medications  Not very active   no exercise Continues to drink and smoke Reports he is trying to cut down on his drinking  EKG personally reviewed by myself on todays visit Shows sinus tachycardia rate 107 bpm nonspecific T wave abnormality anterolateral leads   PMH:   has a past medical history of Asthma and Hypertension.  PSH:    Past Surgical History:  Procedure Laterality Date  . COLONOSCOPY    . ESOPHAGOGASTRODUODENOSCOPY      Current Outpatient Medications  Medication Sig Dispense Refill  . folic acid (FOLVITE) 1 MG tablet Take 1 tablet (1 mg total) by mouth daily. 30 tablet 0  . Multiple Vitamin (MULTIVITAMIN WITH MINERALS) TABS tablet Take 1 tablet by mouth daily. 30 tablet 1  . sacubitril-valsartan (ENTRESTO) 24-26 MG Take 1 tablet by mouth 2  (two) times daily. 60 tablet 0  . thiamine 100 MG tablet Take 1 tablet (100 mg total) by mouth daily. 30 tablet 0   No current facility-administered medications for this visit.      Allergies:   Patient has no known allergies.   Social History:  The patient  reports that he has been smoking.  He has a 7.50 pack-year smoking history. he has never used smokeless tobacco. He reports that he drinks alcohol. He reports that he uses drugs. Drug: Cocaine.   Family History:   family history is not on file.    Review of Systems: Review of Systems  Constitutional: Negative.   Respiratory: Positive for cough and shortness of breath.   Cardiovascular: Positive for leg swelling.  Gastrointestinal: Negative.   Musculoskeletal: Negative.   Neurological: Negative.   Psychiatric/Behavioral: Negative.   All other systems reviewed and are negative.    PHYSICAL EXAM: VS:  BP 118/82 (BP Location: Right Arm, Patient Position: Sitting, Cuff Size: Normal)   Pulse (!) 107   Ht 5\' 10"  (1.778 m)   Wt 220 lb 8 oz (100 kg)   BMI 31.64 kg/m  , BMI Body mass index is 31.64 kg/m. GEN: Well nourished, well developed, in no acute distress  HEENT: normal  Neck: no JVD, carotid bruits, or masses Cardiac: RRR; no murmurs, rubs, or gallops, 2 + pitting edema to the legs/thighs and scrotum Respiratory: Clear with scant rails of the bases, normal work of breathing GI: soft, nontender, nondistended, + BS MS: no deformity or  atrophy  Skin: warm and dry, no rash Neuro:  Strength and sensation are intact Psych: euthymic mood, full affect   Recent Labs: 09/06/2017: ALT 18; B Natriuretic Peptide 2,029.0; Hemoglobin 14.9; Platelets 154 09/07/2017: TSH 7.547 09/09/2017: BUN 12; Creatinine, Ser 1.18; Magnesium 1.8; Potassium 3.5; Sodium 136    Lipid Panel No results found for: CHOL, HDL, LDLCALC, TRIG    Wt Readings from Last 3 Encounters:  10/27/17 220 lb 8 oz (100 kg)  09/16/17 196 lb (88.9 kg)   09/09/17 182 lb 12.8 oz (82.9 kg)       ASSESSMENT AND PLAN:  Essential hypertension Medication changes as below  Chronic systolic CHF (congestive heart failure) (HCC) - Plan: EKG 12-Lead Only on one medication, entresto Would recommend him start Coreg 6.25 mill grams twice a day Lasix 40 mg twice a day with potassium 20 twice a day Stay on entresto. 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 He will continue to work on this and does not want any assistance with chantix.   Cocaine abuse (HCC) Stressed importance of cocaine cessation We'll start low-dose carvedilol After his reassurance she would not be taking cocaine  Nonischemic cardiomyopathy (HCC) - Plan: EKG 12-Lead  medication changes as above   Disposition:   F/U  6 weeks    Total encounter time more than 25 minutes  Greater than 50% was spent in counseling and coordination of care with the patient    Orders Placed This Encounter  Procedures  . EKG 12-Lead     Signed, Dossie Arbour, M.D., Ph.D. 10/27/2017  [facility] [place], [place] [phone]

## 2017-10-27 ENCOUNTER — Ambulatory Visit (INDEPENDENT_AMBULATORY_CARE_PROVIDER_SITE_OTHER): Payer: BLUE CROSS/BLUE SHIELD | Admitting: Cardiovascular Disease

## 2017-10-27 ENCOUNTER — Encounter: Payer: Self-pay | Admitting: Cardiovascular Disease

## 2017-10-27 VITALS — BP 118/82 | HR 107 | Ht 70.0 in | Wt 220.5 lb

## 2017-10-27 DIAGNOSIS — F17200 Nicotine dependence, unspecified, uncomplicated: Secondary | ICD-10-CM | POA: Diagnosis not present

## 2017-10-27 DIAGNOSIS — I10 Essential (primary) hypertension: Secondary | ICD-10-CM

## 2017-10-27 DIAGNOSIS — F1910 Other psychoactive substance abuse, uncomplicated: Secondary | ICD-10-CM

## 2017-10-27 DIAGNOSIS — F1010 Alcohol abuse, uncomplicated: Secondary | ICD-10-CM

## 2017-10-27 DIAGNOSIS — I428 Other cardiomyopathies: Secondary | ICD-10-CM | POA: Diagnosis not present

## 2017-10-27 DIAGNOSIS — I5022 Chronic systolic (congestive) heart failure: Secondary | ICD-10-CM | POA: Diagnosis not present

## 2017-10-27 DIAGNOSIS — F1410 Cocaine abuse, uncomplicated: Secondary | ICD-10-CM

## 2017-10-27 MED ORDER — CARVEDILOL 6.25 MG PO TABS: 6.2500 mg | ORAL_TABLET | Freq: Two times a day (BID) | ORAL | 3 refills | Status: DC

## 2017-10-27 MED ORDER — FUROSEMIDE 40 MG PO TABS: 40.0000 mg | ORAL_TABLET | Freq: Two times a day (BID) | ORAL | 3 refills | Status: DC

## 2017-10-27 MED ORDER — POTASSIUM CHLORIDE CRYS ER 20 MEQ PO TBCR: 20.0000 meq | EXTENDED_RELEASE_TABLET | Freq: Two times a day (BID) | ORAL | 3 refills | Status: DC

## 2017-10-27 NOTE — Patient Instructions (Addendum)
Medication Instructions:   Please start lasix 40 mg twice a day (morning and after lunch) Take with potassium 20 twice a day  Start coreg 6.25 mg twice a day (slow the heart speed)  Stay on entresto twice a day  Labwork:  No new labs needed  Testing/Procedures:  No further testing at this time   Follow-Up: It was a pleasure seeing you in the office today. Please call us if you have new issues that need to be addressed before your next appt.  [phone]  Your physician wants you to follow-up in: 6 weeks  CHF Clinic in 3 weeks.   If you need a refill on your cardiac medications before your next appointment, please call your pharmacy.

## 2017-11-13 ENCOUNTER — Inpatient Hospital Stay
Admission: EM | Admit: 2017-11-13 | Discharge: 2017-11-21 | DRG: 286 | Disposition: A | Discharge status: Home / Self Care | Payer: BLUE CROSS/BLUE SHIELD | Attending: Internal Medicine | Admitting: Internal Medicine

## 2017-11-13 ENCOUNTER — Other Ambulatory Visit: Payer: Self-pay

## 2017-11-13 ENCOUNTER — Encounter: Payer: Self-pay | Admitting: Emergency Medicine

## 2017-11-13 ENCOUNTER — Other Ambulatory Visit: Payer: Self-pay | Admitting: Cardiovascular Disease

## 2017-11-13 ENCOUNTER — Emergency Department: Payer: BLUE CROSS/BLUE SHIELD

## 2017-11-13 DIAGNOSIS — I471 Supraventricular tachycardia: Secondary | ICD-10-CM | POA: Diagnosis present

## 2017-11-13 DIAGNOSIS — E039 Hypothyroidism, unspecified: Secondary | ICD-10-CM | POA: Diagnosis present

## 2017-11-13 DIAGNOSIS — N179 Acute kidney failure, unspecified: Secondary | ICD-10-CM | POA: Diagnosis present

## 2017-11-13 DIAGNOSIS — I272 Pulmonary hypertension, unspecified: Secondary | ICD-10-CM | POA: Diagnosis present

## 2017-11-13 DIAGNOSIS — E1165 Type 2 diabetes mellitus with hyperglycemia: Secondary | ICD-10-CM | POA: Diagnosis present

## 2017-11-13 DIAGNOSIS — N183 Chronic kidney disease, stage 3 (moderate): Secondary | ICD-10-CM | POA: Diagnosis present

## 2017-11-13 DIAGNOSIS — Z9111 Patient's noncompliance with dietary regimen: Secondary | ICD-10-CM

## 2017-11-13 DIAGNOSIS — R7989 Other specified abnormal findings of blood chemistry: Secondary | ICD-10-CM | POA: Diagnosis present

## 2017-11-13 DIAGNOSIS — I420 Dilated cardiomyopathy: Secondary | ICD-10-CM | POA: Diagnosis not present

## 2017-11-13 DIAGNOSIS — Z23 Encounter for immunization: Secondary | ICD-10-CM | POA: Diagnosis not present

## 2017-11-13 DIAGNOSIS — F1410 Cocaine abuse, uncomplicated: Secondary | ICD-10-CM | POA: Diagnosis present

## 2017-11-13 DIAGNOSIS — T380X5A Adverse effect of glucocorticoids and synthetic analogues, initial encounter: Secondary | ICD-10-CM | POA: Diagnosis not present

## 2017-11-13 DIAGNOSIS — I248 Other forms of acute ischemic heart disease: Secondary | ICD-10-CM | POA: Diagnosis present

## 2017-11-13 DIAGNOSIS — Z79899 Other long term (current) drug therapy: Secondary | ICD-10-CM

## 2017-11-13 DIAGNOSIS — I2510 Atherosclerotic heart disease of native coronary artery without angina pectoris: Secondary | ICD-10-CM | POA: Diagnosis present

## 2017-11-13 DIAGNOSIS — I509 Heart failure, unspecified: Secondary | ICD-10-CM

## 2017-11-13 DIAGNOSIS — I10 Essential (primary) hypertension: Secondary | ICD-10-CM | POA: Diagnosis present

## 2017-11-13 DIAGNOSIS — Z9114 Patient's other noncompliance with medication regimen: Secondary | ICD-10-CM | POA: Diagnosis not present

## 2017-11-13 DIAGNOSIS — F1010 Alcohol abuse, uncomplicated: Secondary | ICD-10-CM | POA: Diagnosis present

## 2017-11-13 DIAGNOSIS — I472 Ventricular tachycardia: Secondary | ICD-10-CM | POA: Diagnosis present

## 2017-11-13 DIAGNOSIS — F17200 Nicotine dependence, unspecified, uncomplicated: Secondary | ICD-10-CM

## 2017-11-13 DIAGNOSIS — E1122 Type 2 diabetes mellitus with diabetic chronic kidney disease: Secondary | ICD-10-CM | POA: Diagnosis present

## 2017-11-13 DIAGNOSIS — I428 Other cardiomyopathies: Secondary | ICD-10-CM

## 2017-11-13 DIAGNOSIS — F17210 Nicotine dependence, cigarettes, uncomplicated: Secondary | ICD-10-CM | POA: Diagnosis present

## 2017-11-13 DIAGNOSIS — I5082 Biventricular heart failure: Secondary | ICD-10-CM | POA: Diagnosis present

## 2017-11-13 DIAGNOSIS — I130 Hypertensive heart and chronic kidney disease with heart failure and stage 1 through stage 4 chronic kidney disease, or unspecified chronic kidney disease: Secondary | ICD-10-CM | POA: Diagnosis present

## 2017-11-13 DIAGNOSIS — E669 Obesity, unspecified: Secondary | ICD-10-CM | POA: Diagnosis present

## 2017-11-13 DIAGNOSIS — Z6830 Body mass index (BMI) 30.0-30.9, adult: Secondary | ICD-10-CM

## 2017-11-13 DIAGNOSIS — J45909 Unspecified asthma, uncomplicated: Secondary | ICD-10-CM | POA: Diagnosis present

## 2017-11-13 DIAGNOSIS — I5023 Acute on chronic systolic (congestive) heart failure: Secondary | ICD-10-CM | POA: Diagnosis present

## 2017-11-13 HISTORY — DX: Other psychoactive substance abuse, uncomplicated: F19.10

## 2017-11-13 HISTORY — DX: Patient's noncompliance with other medical treatment and regimen due to unspecified reason: Z91.199

## 2017-11-13 HISTORY — DX: Gastro-esophageal reflux disease without esophagitis: K21.9

## 2017-11-13 HISTORY — DX: Chronic systolic (congestive) heart failure: I50.22

## 2017-11-13 HISTORY — DX: Obesity, unspecified: E66.9

## 2017-11-13 HISTORY — DX: Patient's noncompliance with other medical treatment and regimen: Z91.19

## 2017-11-13 LAB — URINE DRUG SCREEN, QUALITATIVE (ARMC ONLY)
AMPHETAMINES, UR SCREEN: NOT DETECTED
Barbiturates, Ur Screen: NOT DETECTED
Benzodiazepine, Ur Scrn: NOT DETECTED
COCAINE METABOLITE, UR ~~LOC~~: POSITIVE — AB
Cannabinoid 50 Ng, Ur ~~LOC~~: NOT DETECTED
MDMA (ECSTASY) UR SCREEN: NOT DETECTED
METHADONE SCREEN, URINE: NOT DETECTED
OPIATE, UR SCREEN: NOT DETECTED
PHENCYCLIDINE (PCP) UR S: NOT DETECTED
Tricyclic, Ur Screen: NOT DETECTED

## 2017-11-13 LAB — HEPATIC FUNCTION PANEL
ALBUMIN: 3.7 g/dL (ref 3.5–5.0)
ALK PHOS: 98 U/L (ref 38–126)
ALT: 18 U/L (ref 17–63)
AST: 44 U/L — AB (ref 15–41)
BILIRUBIN INDIRECT: 2 mg/dL — AB (ref 0.3–0.9)
BILIRUBIN TOTAL: 3.2 mg/dL — AB (ref 0.3–1.2)
Bilirubin, Direct: 1.2 mg/dL — ABNORMAL HIGH (ref 0.1–0.5)
Total Protein: 7.6 g/dL (ref 6.5–8.1)

## 2017-11-13 LAB — BASIC METABOLIC PANEL
ANION GAP: 15 (ref 5–15)
BUN: 19 mg/dL (ref 6–20)
CHLORIDE: 101 mmol/L (ref 101–111)
CO2: 24 mmol/L (ref 22–32)
Calcium: 9.4 mg/dL (ref 8.9–10.3)
Creatinine, Ser: 1.4 mg/dL — ABNORMAL HIGH (ref 0.61–1.24)
GFR calc Af Amer: >60 mL/min (ref >60)
GFR, EST NON AFRICAN AMERICAN: 54 mL/min — AB (ref >60)
Glucose, Bld: 125 mg/dL — ABNORMAL HIGH (ref 65–99)
POTASSIUM: 4.5 mmol/L (ref 3.5–5.1)
SODIUM: 140 mmol/L (ref 135–145)

## 2017-11-13 LAB — CBC
HEMATOCRIT: 41.4 % (ref 40.0–52.0)
HEMOGLOBIN: 13.3 g/dL (ref 13.0–18.0)
MCH: 31.8 pg (ref 26.0–34.0)
MCHC: 32.2 g/dL (ref 32.0–36.0)
MCV: 98.8 fL (ref 80.0–100.0)
Platelets: 110 10*3/uL — ABNORMAL LOW (ref 150–440)
RBC: 4.19 MIL/uL — ABNORMAL LOW (ref 4.40–5.90)
RDW: 15.1 % — ABNORMAL HIGH (ref 11.5–14.5)
WBC: 4.8 10*3/uL (ref 3.8–10.6)

## 2017-11-13 LAB — TROPONIN I
TROPONIN I: 0.03 ng/mL — AB (ref <0.03)
Troponin I: 0.03 ng/mL (ref <0.03)
Troponin I: <0.03 ng/mL (ref <0.03)

## 2017-11-13 LAB — MAGNESIUM: MAGNESIUM: 1.1 mg/dL — AB (ref 1.7–2.4)

## 2017-11-13 LAB — TSH: TSH: 7.739 u[IU]/mL — ABNORMAL HIGH (ref 0.350–4.500)

## 2017-11-13 LAB — BRAIN NATRIURETIC PEPTIDE

## 2017-11-13 MED ORDER — HEPARIN SODIUM (PORCINE) 5000 UNIT/ML IJ SOLN
5000.0000 [IU] | Freq: Three times a day (TID) | INTRAMUSCULAR | Status: DC
  Administered 2017-11-13 – 2017-11-21 (×22): 5000 [IU] via SUBCUTANEOUS
  Filled 2017-11-13 (×23): qty 1

## 2017-11-13 MED ORDER — LORAZEPAM 1 MG PO TABS: 1.0000 mg | ORAL_TABLET | ORAL | Status: DC | PRN

## 2017-11-13 MED ORDER — FUROSEMIDE 10 MG/ML IJ SOLN
40.0000 mg | Freq: Three times a day (TID) | INTRAMUSCULAR | Status: DC
  Administered 2017-11-13 – 2017-11-14 (×3): 40 mg via INTRAVENOUS
  Filled 2017-11-13 (×3): qty 4

## 2017-11-13 MED ORDER — ATORVASTATIN CALCIUM 20 MG PO TABS
40.0000 mg | ORAL_TABLET | Freq: Every day | ORAL | Status: DC
  Administered 2017-11-13 – 2017-11-21 (×9): 40 mg via ORAL
  Filled 2017-11-13 (×9): qty 2

## 2017-11-13 MED ORDER — SODIUM CHLORIDE 0.9% FLUSH
3.0000 mL | Freq: Two times a day (BID) | INTRAVENOUS | Status: DC
  Administered 2017-11-13 – 2017-11-21 (×16): 3 mL via INTRAVENOUS

## 2017-11-13 MED ORDER — DOCUSATE SODIUM 100 MG PO CAPS: 100.0000 mg | ORAL_CAPSULE | Freq: Two times a day (BID) | ORAL | Status: DC | PRN

## 2017-11-13 MED ORDER — ADULT MULTIVITAMIN W/MINERALS CH
1.0000 | ORAL_TABLET | Freq: Every day | ORAL | Status: DC
  Administered 2017-11-14 – 2017-11-21 (×8): 1 via ORAL
  Filled 2017-11-13 (×8): qty 1

## 2017-11-13 MED ORDER — FOLIC ACID 1 MG PO TABS
1.0000 mg | ORAL_TABLET | Freq: Every day | ORAL | Status: DC
Start: 2017-11-14 — End: 2017-11-21
  Administered 2017-11-14 – 2017-11-21 (×8): 1 mg via ORAL
  Filled 2017-11-13 (×8): qty 1

## 2017-11-13 MED ORDER — METHYLPREDNISOLONE SODIUM SUCC 125 MG IJ SOLR
60.0000 mg | Freq: Four times a day (QID) | INTRAMUSCULAR | Status: DC
  Administered 2017-11-13 – 2017-11-16 (×12): 60 mg via INTRAVENOUS
  Filled 2017-11-13 (×12): qty 2

## 2017-11-13 MED ORDER — IPRATROPIUM-ALBUTEROL 0.5-2.5 (3) MG/3ML IN SOLN
3.0000 mL | RESPIRATORY_TRACT | Status: DC
  Administered 2017-11-13 (×2): 3 mL via RESPIRATORY_TRACT
  Filled 2017-11-13: qty 3

## 2017-11-13 MED ORDER — FUROSEMIDE 10 MG/ML IJ SOLN
40.0000 mg | Freq: Once | INTRAMUSCULAR | Status: AC
  Administered 2017-11-13: 40 mg via INTRAVENOUS
  Filled 2017-11-13: qty 4

## 2017-11-13 MED ORDER — CARVEDILOL 6.25 MG PO TABS
6.2500 mg | ORAL_TABLET | Freq: Two times a day (BID) | ORAL | Status: DC
  Administered 2017-11-13 – 2017-11-21 (×15): 6.25 mg via ORAL
  Filled 2017-11-13 (×16): qty 1

## 2017-11-13 MED ORDER — SODIUM CHLORIDE 0.9% FLUSH
3.0000 mL | INTRAVENOUS | Status: DC | PRN
Start: 2017-11-13 — End: 2017-11-21
  Administered 2017-11-14 (×2): 3 mL via INTRAVENOUS
  Filled 2017-11-13 (×2): qty 3

## 2017-11-13 MED ORDER — VITAMIN B-1 100 MG PO TABS
100.0000 mg | ORAL_TABLET | Freq: Every day | ORAL | Status: DC
  Administered 2017-11-13 – 2017-11-15 (×3): 100 mg via ORAL
  Filled 2017-11-13 (×3): qty 1

## 2017-11-13 MED ORDER — IPRATROPIUM-ALBUTEROL 0.5-2.5 (3) MG/3ML IN SOLN: 3.0000 mL | RESPIRATORY_TRACT | Status: DC | PRN

## 2017-11-13 MED ORDER — SACUBITRIL-VALSARTAN 24-26 MG PO TABS
1.0000 | ORAL_TABLET | Freq: Two times a day (BID) | ORAL | Status: DC
  Administered 2017-11-13 – 2017-11-21 (×16): 1 via ORAL
  Filled 2017-11-13 (×16): qty 1

## 2017-11-13 NOTE — ED Notes (Signed)
Dr. Vachhani at bedside  

## 2017-11-13 NOTE — ED Triage Notes (Signed)
Pt arrived via POV by self, pt states he is having shortness of breath and states he is having fluid from his feet up to his abdomen.  Pt states when he lies down it is hard to breathe and when ambulating short distances it is hard to breathe.  Pt has hx of HF.

## 2017-11-13 NOTE — H&P (Signed)
[facility] [place] [facility]   PATIENT NAME: Marcus Foster    MR#:  [id number]  DATE OF BIRTH:  Sep 26, 1961  DATE OF ADMISSION:  11/13/2017  PRIMARY CARE PHYSICIAN: Armando Gang, FNP   REQUESTING/REFERRING PHYSICIAN: Pershing Proud   CHIEF COMPLAINT:   Chief Complaint  Patient presents with  . Shortness of Breath  . Congestive Heart Failure    HISTORY OF PRESENT ILLNESS: Marcus Foster  is a 57 y.o. male with a known history of asthma, systolic congestive heart failure with EF of 25%, hypertension- claims to be taking his medication as prescribed. He was seen in cardiology clinic 2 weeks ago where he was noted to have excessive edema on his lower extremities and advised to take 40 mg Lasix 2 times a day with potassium supplement and entresto. Patient claims to be compliant with his medication and with fluid restrictions. Is still continued to feel very short of breath could not sleep flat at night and has to stay on the recliner, concerned with this he came to emergency room today. He is symptomatic with excessive swelling all up to his abdominal wall and outpatient interventions are not helping, so ER physician suggested to admit him for IV Lasix.  PAST MEDICAL HISTORY:   Past Medical History:  Diagnosis Date  . Acid reflux   . Asthma   . CHF (congestive heart failure) (HCC)   . Hypertension     PAST SURGICAL HISTORY:  Past Surgical History:  Procedure Laterality Date  . COLONOSCOPY    . ESOPHAGOGASTRODUODENOSCOPY      SOCIAL HISTORY:  Social History   Tobacco Use  . Smoking status: Current Every Day Smoker    Packs/day: 0.25    Years: 30.00    Pack years: 7.50  . Smokeless tobacco: Never Used  Substance Use Topics  . Alcohol use: Yes    Comment: "couple shots a day" and beer    FAMILY HISTORY:  Family History  Problem Relation Age of Onset  . Hypertension Mother     DRUG ALLERGIES: No Known Allergies  REVIEW OF SYSTEMS:    CONSTITUTIONAL: No fever, fatigue or weakness.  EYES: No blurred or double vision.  EARS, NOSE, AND THROAT: No tinnitus or ear pain.  RESPIRATORY: No cough, positive for shortness of breath, no wheezing or hemoptysis.  CARDIOVASCULAR: No chest pain, orthopnea, edema.  GASTROINTESTINAL: No nausea, vomiting, diarrhea or abdominal pain.  GENITOURINARY: No dysuria, hematuria.  ENDOCRINE: No polyuria, nocturia,  HEMATOLOGY: No anemia, easy bruising or bleeding SKIN: No rash or lesion. MUSCULOSKELETAL: No joint pain or arthritis.   NEUROLOGIC: No tingling, numbness, weakness.  PSYCHIATRY: No anxiety or depression.   MEDICATIONS AT HOME:  Prior to Admission medications   Medication Sig Start Date End Date Taking? Authorizing Provider  atorvastatin (LIPITOR) 40 MG tablet Take 40 mg by mouth daily.   Yes [provider]  carvedilol (COREG) 6.25 MG tablet Take 1 tablet (6.25 mg total) by mouth 2 (two) times daily. 10/27/17  Yes Gollan, Tollie Pizza, MD  folic acid (FOLVITE) 1 MG tablet Take 1 tablet (1 mg total) by mouth daily. 09/09/17  Yes Enedina Finner, MD  furosemide (LASIX) 40 MG tablet Take 1 tablet (40 mg total) by mouth 2 (two) times daily. 10/27/17 01/25/18 Yes Gollan, Tollie Pizza, MD  Multiple Vitamin (MULTIVITAMIN WITH MINERALS) TABS tablet Take 1 tablet by mouth daily. 09/09/17  Yes Enedina Finner, MD  potassium chloride SA (K-DUR,KLOR-CON) 20 MEQ tablet Take 1  tablet (20 mEq total) by mouth 2 (two) times daily. 10/27/17  Yes Gollan, Tollie Pizza, MD  sacubitril-valsartan (ENTRESTO) 24-26 MG Take 1 tablet by mouth 2 (two) times daily. Patient not taking: Reported on 11/13/2017 09/16/17   Clarisa Kindred A, FNP  thiamine 100 MG tablet Take 1 tablet (100 mg total) by mouth daily. Patient not taking: Reported on 11/13/2017 09/11/17   Enedina Finner, MD      PHYSICAL EXAMINATION:   VITAL SIGNS: Blood pressure (!) 130/104, pulse (!) 106, temperature 97.6 F (36.4 C), temperature source Oral, resp.  rate (!) 29, height 5\' 10"  (1.778 m), weight 99.8 kg (220 lb), SpO2 100 %.  GENERAL:  57 y.o.-year-old patient lying in the bed with no acute distress.  EYES: Pupils equal, round, reactive to light and accommodation. No scleral icterus. Extraocular muscles intact.  HEENT: Head atraumatic, normocephalic. Oropharynx and nasopharynx clear.  NECK:  Supple, no jugular venous distention. No thyroid enlargement, no tenderness.  LUNGS: Normal breath sounds bilaterally, no wheezing, some crepitation. No use of accessory muscles of respiration.  CARDIOVASCULAR: S1, S2 normal. No murmurs, rubs, or gallops.  ABDOMEN: Soft, nontender, nondistended. Bowel sounds present. No organomegaly or mass.  EXTREMITIES: Bilateral excessive pedal edema, extending up to his thighs and lower abdominal wall, no cyanosis, or clubbing.  NEUROLOGIC: Cranial nerves II through XII are intact. Muscle strength 5/5 in all extremities. Sensation intact. Gait not checked.  PSYCHIATRIC: The patient is alert and oriented x 3.  SKIN: No obvious rash, lesion, or ulcer.   LABORATORY PANEL:   CBC Recent Labs  Lab 11/13/17 0755  WBC 4.8  HGB 13.3  HCT 41.4  PLT 110*  MCV 98.8  MCH 31.8  MCHC 32.2  RDW 15.1*   ------------------------------------------------------------------------------------------------------------------  Chemistries  Recent Labs  Lab 11/13/17 0755  NA 140  K 4.5  CL 101  CO2 24  GLUCOSE 125*  BUN 19  CREATININE 1.40*  CALCIUM 9.4   ------------------------------------------------------------------------------------------------------------------ estimated creatinine clearance is 67.3 mL/min (A) (by C-G formula based on SCr of 1.4 mg/dL (H)). ------------------------------------------------------------------------------------------------------------------ No results for input(s): TSH, T4TOTAL, T3FREE, THYROIDAB in the last 72 hours.  Invalid input(s): FREET3   Coagulation profile No results  for input(s): INR, PROTIME in the last 168 hours. ------------------------------------------------------------------------------------------------------------------- No results for input(s): DDIMER in the last 72 hours. -------------------------------------------------------------------------------------------------------------------  Cardiac Enzymes Recent Labs  Lab 11/13/17 0755  TROPONINI 0.03*   ------------------------------------------------------------------------------------------------------------------ Invalid input(s): POCBNP  ---------------------------------------------------------------------------------------------------------------  Urinalysis    Component Value Date/Time   COLORURINE YELLOW (A) 09/06/2017 2145   APPEARANCEUR CLEAR (A) 09/06/2017 2145   LABSPEC 1.009 09/06/2017 2145   PHURINE 6.0 09/06/2017 2145   GLUCOSEU NEGATIVE 09/06/2017 2145   HGBUR NEGATIVE 09/06/2017 2145   BILIRUBINUR NEGATIVE 09/06/2017 2145   KETONESUR NEGATIVE 09/06/2017 2145   PROTEINUR 30 (A) 09/06/2017 2145   NITRITE NEGATIVE 09/06/2017 2145   LEUKOCYTESUR NEGATIVE 09/06/2017 2145     RADIOLOGY: Dg Chest 2 View  Result Date: 11/13/2017 CLINICAL DATA:  Pt c/o shortness of breath, AND cough. Pt states he has had a chronic cough x yrs Hx of asthma, CHF, and hypertension EXAM: CHEST - 2 VIEW COMPARISON:  09/06/2017 FINDINGS: Lungs are clear,  hyperinflated. Stable mild cardiomegaly. No effusion. Visualized bones unremarkable. IMPRESSION: Pulmonary hyperinflation, no acute cardiopulmonary disease. Electronically Signed   By: Corlis Leak M.D.   On: 11/13/2017 08:20    EKG: Orders placed or performed during the hospital encounter of 11/13/17  . ED EKG within 10  minutes  . ED EKG within 10 minutes    IMPRESSION AND PLAN:  * Generalized edema,   Acute on chronic systolic congestive heart failure    IV Lasix, restriction on fluid, intake and output measurement.   Monitor on  telemetry, serial troponin, cardiology consult.   Recent echocardiogram showed EF of 25%.   Continue home medications like Coreg, atorvastatin.   As per pharmacy record, patient is not taking enteresto- so I will leave it up to cardiologist to start that.  * Hypertension   Continue carvedilol.  * CKD stage III   Monitor with IV Lasix.  * Alcohol abuse   Continue vitamin supplementation, we may help to keep him on CIWA if any withdrawal symptoms, currently none.  * Smoking   Counseled to quit smoking for 4 minutes and offered nicotine patch.  All the records are reviewed and case discussed with ED provider. Management plans discussed with the patient, family and they are in agreement.  CODE STATUS: Full code Code Status History    Date Active Date Inactive Code Status Order ID Comments User Context   09/07/2017 00:01 09/09/2017 19:24 Full Code [id number]  Salary, Evelena Asa, MD Inpatient       TOTAL TIME TAKING CARE OF THIS PATIENT: 50 minutes.    Altamese Dilling M.D on 11/13/2017   Between 7am to 6pm - Pager - [phone]  After 6pm go to [email or web address] - password [email or web address]  [facility]  Office  [phone]  CC: Primary care physician; Armando Gang, FNP   Note: This dictation was prepared with Dragon dictation along with smaller phrase technology. Any transcriptional errors that result from this process are unintentional.

## 2017-11-13 NOTE — Consult Note (Signed)
Cardiology Consultation:   Patient ID: Marcus Foster; [id number]; 10-Sep-1961   Admit date: 11/13/2017 Date of Consult: 11/13/2017  Primary Care Provider: Armando Gang, FNP Primary Cardiologist: Mariah Milling   Patient Profile:   Marcus Foster is a 57 y.o. male with a hx of chronic systolic CHF diagnosed in 08/2017 with an EF of 20-25% by TTE without prior ischemic workup, polysubstance abuse with ongoing cocaine, alcohol, and tobacco abuse, medication noncompliance, HTN, thrombocytopenia, and obesity who is being seen today for the evaluation of acute on chronic systolic CHF/biventricular failure at the request of Dr. Elisabeth Pigeon.  History of Present Illness:   [name] was previously admitted to [facility] in 08/2017 with acute systolic CHF. Echo 09/07/17 showed an EF of 20-25%, moderate LVH, unable to exclude RWMA, mild to moderate MR, moderately dilated LV, mildly dilated RV with moderately reduced RVSF, mildly dilated RA, trivial pericardial effusion. He was diuresed from an admission weight of 210 pounds to a discharge weight of 182 pounds. Electrolytes were repleted. Troponin was mildly elevated with a peak of 0.05. Urine drug screen was positive for cocaine. ETOH was elevated at 270. BNP of 2,029. Cardiology was not consulted at that time. Discharge medications included ASA, Lipitor, Lasix 40 mg daily, and lisinopril along with his non-cardiac medications. He followed up with the [facility] on 12/5 with a weight of 196 pounds. At that visit he was noted to be mildly fluid overloaded. His lisinopril was changed to [facility]. His diuretic was not changed. He established care with Dr. Mariah Milling on 10/27/17 and was noted to be volume overloaded at that time with a weight of 220 pounds. It was noted he was only taking Entresto. He was restarted on Lasix 40 mg bid with KCl. Cessation of his polysubstance was also advised.   Patient presented to [facility] ED on 11/13/17 with continued SOB, LE swelling, and  orthopnea. He reports he has been noting worsening LE swelling, orthopnea, abdominal swelling, SOB, and cough since the early part of December, 2018. He reports in the month of December he was taking "6 pills" daily and in January he was taking "8 pills" daily. He most recently used cocaine on 11/10/2017 for "my birthday." He continues to drink 1/2 to 1 pint daily with 1-2 twelve oz beers daily. Smokes 2-4 cigarettes daily. Not weighing himself at home since the early part of 09/2017.   Upon the patient's arrival to [facility] they were found to have stable vitals. Oxygen saturation 100% on room air EKG not acute as below, CXR showed hyperinflation without acute cardiopulmonary process. Labs showed BNP > 4,500, troponin 0.03, WBC 4.8, HGB 13.3, PLT 110, Na 140, K+ 4.5, BUN/SCr 19/1.40. He was given IV Lasix 40 mg in the ED with a documented UOP of 1.1 L to date. He was restarted on Lipitor and Coreg. Urine drug screen is pending.   Past Medical History:  Diagnosis Date  . Acid reflux   . Asthma   . Chronic systolic CHF (congestive heart failure) (HCC)    a. TTE 08/2017: EF 20-25%, moderate LVH, unable to exclude RWMA, mild to moderate MR, moderately dilated LV, mildly dilated RV with moderately reduced RVSF, mildly dilated RA, trivial pericardial effusion  . Hypertension   . Noncompliance   . Obesity   . Polysubstance abuse (HCC)    a. ongoing cocaine, tobacco, and alcohol abuse    Past Surgical History:  Procedure Laterality Date  . COLONOSCOPY    .  ESOPHAGOGASTRODUODENOSCOPY       Home Meds: Prior to Admission medications   Medication Sig Start Date End Date Taking? Authorizing Provider  atorvastatin (LIPITOR) 40 MG tablet Take 40 mg by mouth daily.   Yes [provider]  carvedilol (COREG) 6.25 MG tablet Take 1 tablet (6.25 mg total) by mouth 2 (two) times daily. 10/27/17  Yes Gollan, Tollie Pizza, MD  folic acid (FOLVITE) 1 MG tablet Take 1 tablet (1 mg total) by mouth daily. 09/09/17   Yes Enedina Finner, MD  furosemide (LASIX) 40 MG tablet Take 1 tablet (40 mg total) by mouth 2 (two) times daily. 10/27/17 01/25/18 Yes Gollan, Tollie Pizza, MD  Multiple Vitamin (MULTIVITAMIN WITH MINERALS) TABS tablet Take 1 tablet by mouth daily. 09/09/17  Yes Enedina Finner, MD  potassium chloride SA (K-DUR,KLOR-CON) 20 MEQ tablet Take 1 tablet (20 mEq total) by mouth 2 (two) times daily. 10/27/17  Yes Gollan, Tollie Pizza, MD  sacubitril-valsartan (ENTRESTO) 24-26 MG Take 1 tablet by mouth 2 (two) times daily. Patient not taking: Reported on 11/13/2017 09/16/17   Clarisa Kindred A, FNP  thiamine 100 MG tablet Take 1 tablet (100 mg total) by mouth daily. Patient not taking: Reported on 11/13/2017 09/11/17   Enedina Finner, MD    Inpatient Medications: Scheduled Meds: . atorvastatin  40 mg Oral Daily  . carvedilol  6.25 mg Oral BID WC  . [START ON 11/14/2017] folic acid  1 mg Oral Daily  . furosemide  40 mg Intravenous Q8H  . heparin  5,000 Units Subcutaneous Q8H  . [START ON 11/14/2017] multivitamin with minerals  1 tablet Oral Daily  . sodium chloride flush  3 mL Intravenous Q12H  . thiamine  100 mg Oral Daily   Continuous Infusions:  PRN Meds: docusate sodium  Allergies:  No Known Allergies  Social History:   Social History   Socioeconomic History  . Marital status: Single    Spouse name: Not on file  . Number of children: Not on file  . Years of education: Not on file  . Highest education level: Not on file  Social Needs  . Financial resource strain: Not on file  . Food insecurity - worry: Not on file  . Food insecurity - inability: Not on file  . Transportation needs - medical: Not on file  . Transportation needs - non-medical: Not on file  Occupational History  . Not on file  Tobacco Use  . Smoking status: Current Every Day Smoker    Packs/day: 0.25    Years: 30.00    Pack years: 7.50  . Smokeless tobacco: Never Used  Substance and Sexual Activity  . Alcohol use: Yes    Comment:  "couple shots a day" and beer  . Drug use: Yes    Types: Cocaine  . Sexual activity: Not on file  Other Topics Concern  . Not on file  Social History Narrative  . Not on file     Family History:   Family History  Problem Relation Age of Onset  . Hypertension Mother     ROS:  Review of Systems  Constitutional: Positive for malaise/fatigue. Negative for chills, diaphoresis, fever and weight loss.  HENT: Negative for congestion.   Eyes: Negative for discharge and redness.  Respiratory: Positive for cough and shortness of breath. Negative for hemoptysis, sputum production and wheezing.   Cardiovascular: Positive for orthopnea and leg swelling. Negative for chest pain, palpitations, claudication and PND.  Gastrointestinal: Negative for abdominal pain, blood in  stool, heartburn, melena, nausea and vomiting.  Genitourinary: Negative for hematuria.  Musculoskeletal: Negative for falls and myalgias.  Skin: Negative for rash.  Neurological: Positive for weakness. Negative for dizziness, tingling, tremors, sensory change, speech change, focal weakness and loss of consciousness.  Endo/Heme/Allergies: Does not bruise/bleed easily.  Psychiatric/Behavioral: Negative for substance abuse. The patient is not nervous/anxious.   All other systems reviewed and are negative.     Physical Exam/Data:   Vitals:   11/13/17 1130 11/13/17 1230 11/13/17 1300 11/13/17 1435  BP: (!) 131/104 (!) 130/104 (!) 120/99 (!) 122/92  Pulse: (!) 107 (!) 106  (!) 103  Resp: (!) 23 (!) 29 (!) 24 20  Temp:    (!) 97.4 F (36.3 C)  TempSrc:    Oral  SpO2: 98% 100%  97%  Weight:      Height:        Intake/Output Summary (Last 24 hours) at 11/13/2017 1544 Last data filed at 11/13/2017 1416 Gross per 24 hour  Intake -  Output 1100 ml  Net -1100 ml   Filed Weights   11/13/17 0751  Weight: 220 lb (99.8 kg)   Body mass index is 31.57 kg/m.   Physical Exam: General: Well developed, well nourished, in no acute  distress. Head: Normocephalic, atraumatic, sclera non-icteric, no xanthomas, nares without discharge.  Neck: Negative for carotid bruits. JVD elevated to the angle of the mandible. Lungs: Clear bilaterally to auscultation without wheezes, rales, or rhonchi. Breathing is unlabored. Heart: RRR with S1 S2. II/VI systolic murmur at the apex, no rubs, or gallops appreciated. Abdomen: Soft, non-tender, distended with normoactive bowel sounds. No hepatomegaly. No rebound/guarding. No obvious abdominal masses. Msk:  Strength and tone appear normal for age. Extremities: No clubbing or cyanosis. 2+ pitting edema to the hips bilaterally. Distal pedal pulses are 2+ and equal bilaterally. Clubbing.  Neuro: Alert and oriented X 3. No facial asymmetry. No focal deficit. Moves all extremities spontaneously. Psych:  Responds to questions appropriately with a normal affect.   EKG:  The EKG was personally reviewed and demonstrates: sinus tachycardia, 103 bpm, left axis deviation, poor R wave progression, nonspecific st.t changes, lateral TWI Telemetry:  Telemetry was personally reviewed and demonstrates: sinus tachycardia, low 100s bpm  Weights: Filed Weights   11/13/17 0751  Weight: 220 lb (99.8 kg)    Relevant CV Studies: TTE 08/2017: Study Conclusions  - Left ventricle: The cavity size was mildly dilated. Wall   thickness was increased increased in a pattern of mild to   moderate LVH. Systolic function was severely reduced. The   estimated ejection fraction was in the range of 20% to 25%.   Diffuse hypokinesis. Regional wall motion abnormalities cannot be   excluded. - Aortic valve: Calcified annulus. Mildly thickened leaflets. - Mitral valve: Mildly calcified annulus. Mildly thickened leaflets   . There was mild to moderate regurgitation. - Left atrium: The atrium was mildly to moderately dilated. - Right ventricle: The cavity size was mildly dilated. Wall   thickness was normal. Systolic  function was moderately reduced. - Right atrium: The atrium was mildly dilated. - Pericardium, extracardiac: A trivial pericardial effusion was   identified posterior to the heart.  Laboratory Data:  Chemistry Recent Labs  Lab 11/13/17 0755  NA 140  K 4.5  CL 101  CO2 24  GLUCOSE 125*  BUN 19  CREATININE 1.40*  CALCIUM 9.4  GFRNONAA 54*  GFRAA >60  ANIONGAP 15    No results for input(s): PROT, ALBUMIN,  AST, ALT, ALKPHOS, BILITOT in the last 168 hours. Hematology Recent Labs  Lab 11/13/17 0755  WBC 4.8  RBC 4.19*  HGB 13.3  HCT 41.4  MCV 98.8  MCH 31.8  MCHC 32.2  RDW 15.1*  PLT 110*   Cardiac Enzymes Recent Labs  Lab 11/13/17 0755  TROPONINI 0.03*   No results for input(s): TROPIPOC in the last 168 hours.  BNP Recent Labs  Lab 11/13/17 0755  BNP >4,500.0*    DDimer No results for input(s): DDIMER in the last 168 hours.  Radiology/Studies:  Dg Chest 2 View  Result Date: 11/13/2017 IMPRESSION: Pulmonary hyperinflation, no acute cardiopulmonary disease. Electronically Signed   By: Corlis Leak M.D.   On: 11/13/2017 08:20    Assessment and Plan:   1. Acute on chronic systolic CHF/biventricular failure/dilated cardiomyopathy: -Likely in the setting of medication and dietary noncompliance with associated polysubstance abuse -His cardiomyopathy has been presumed to be NICM in the setting of polysubstance abuse -Agree with IV Lasix, currently ordered as 40 mg q 8 hours, may need to back off to bid dosing depending on renal function -Has been restarted on Coreg per IM, he is not acutely decompensated so we will continue this at this time -Restart Entresto 24/26 mg bid -He will need a R/LHC following diuresis to further evaluate his cardiomyopathy. If he remains admitted over the weekend this should be considered to be scheduled for Monday, 11/16/2017 -CHF education -Daily weights -Strict Is and Os -He is probably at least 40-50 pounds volume overloaded -As  renal function improves and closer to discharge would add spironolactone -Recent echo 08/2017 as detailed above, no need to repeat at this time   2. Polysubstance abuse: -Cessation advised -Urine drug screen pending -Check magnesium with recommendation to replete as needed via IV to goal > 2.0  3. HTN: -Add Entresto as above -Coreg as above -Lasix as above  4. Hyperglycemia: -Check A1c -Check FLP for further risk stratification  5. Noncompliance presenting significant health hazards: -Discussed with him in detail  6. Elevated troponin: -Minimally elevated in the ED at 0.03 -Likely supply demand ischemia in the setting of volume overload with underlying renal dysfunction -Continue to cycle until peaks -Will need ischemic work up [place] as above following diuresis    For questions or updates, please contact [facility] Please consult [email or web address] for contact info under Cardiology/STEMI.   Signed, Eula Listen, PA-C [facility] Pager: [phone] 11/13/2017, 3:44 PM

## 2017-11-13 NOTE — ED Provider Notes (Signed)
[facility] Emergency Department Provider Note  ____________________________________________   First MD Initiated Contact with Patient 11/13/17 0809     (approximate)  I have reviewed the triage vital signs and the nursing notes.   HISTORY  Chief Complaint Shortness of Breath and Congestive Heart Failure   HPI Marcus Foster is a 57 y.o. male with a history of CHF, hypertension and cocaine use who is presenting with 1 week of worsening shortness of breath.  He says that he has been coughing up clear phlegm.  Difficulty laying flat as well as with walking.  Says that he last used cocaine 2 days ago when he smoked crack.  Says that he has been compliant with his Coreg as well as his diuretics.  Says that he has also seen increased swelling to his bilateral lower extremities over the past week.   Past Medical History:  Diagnosis Date  . Acid reflux   . Asthma   . CHF (congestive heart failure) (HCC)   . Hypertension     Patient Active Problem List   Diagnosis Date Noted  . Chronic systolic CHF (congestive heart failure) (HCC) 10/24/2017  . Alcohol abuse 10/24/2017  . Smoker 10/24/2017  . Cocaine abuse (HCC) 10/24/2017  . Nonischemic cardiomyopathy (HCC) 10/24/2017  . HTN (hypertension) 09/16/2017  . Substance abuse (HCC) 09/16/2017  . Lymphedema 09/16/2017    Past Surgical History:  Procedure Laterality Date  . COLONOSCOPY    . ESOPHAGOGASTRODUODENOSCOPY      Prior to Admission medications   Medication Sig Start Date End Date Taking? Authorizing Provider  atorvastatin (LIPITOR) 40 MG tablet Take 40 mg by mouth daily.   Yes [provider]  carvedilol (COREG) 6.25 MG tablet Take 1 tablet (6.25 mg total) by mouth 2 (two) times daily. 10/27/17  Yes Gollan, Tollie Pizza, MD  folic acid (FOLVITE) 1 MG tablet Take 1 tablet (1 mg total) by mouth daily. 09/09/17  Yes Enedina Finner, MD  furosemide (LASIX) 40 MG tablet Take 1 tablet (40 mg total) by  mouth 2 (two) times daily. 10/27/17 01/25/18 Yes Gollan, Tollie Pizza, MD  Multiple Vitamin (MULTIVITAMIN WITH MINERALS) TABS tablet Take 1 tablet by mouth daily. 09/09/17  Yes Enedina Finner, MD  potassium chloride SA (K-DUR,KLOR-CON) 20 MEQ tablet Take 1 tablet (20 mEq total) by mouth 2 (two) times daily. 10/27/17  Yes Gollan, Tollie Pizza, MD  sacubitril-valsartan (ENTRESTO) 24-26 MG Take 1 tablet by mouth 2 (two) times daily. Patient not taking: Reported on 11/13/2017 09/16/17   Clarisa Kindred A, FNP  thiamine 100 MG tablet Take 1 tablet (100 mg total) by mouth daily. Patient not taking: Reported on 11/13/2017 09/11/17   Enedina Finner, MD    Allergies Patient has no known allergies.  History reviewed. No pertinent family history.  Social History Social History   Tobacco Use  . Smoking status: Current Every Day Smoker    Packs/day: 0.25    Years: 30.00    Pack years: 7.50  . Smokeless tobacco: Never Used  Substance Use Topics  . Alcohol use: Yes    Comment: "couple shots a day" and beer  . Drug use: Yes    Types: Cocaine    Review of Systems  Constitutional: No fever/chills Eyes: No visual changes. ENT: No sore throat. Cardiovascular: Denies chest pain. Respiratory: As above  gastrointestinal: No abdominal pain.  No nausea, no vomiting.  No diarrhea.  No constipation. Genitourinary: Negative for dysuria. Musculoskeletal: Negative for back pain. Skin:  Negative for rash. Neurological: Negative for headaches, focal weakness or numbness.   ____________________________________________   PHYSICAL EXAM:  VITAL SIGNS: ED Triage Vitals [11/13/17 0751]  Enc Vitals Group     BP 112/87     Pulse Rate (!) 102     Resp (!) 22     Temp 97.6 F (36.4 C)     Temp Source Oral     SpO2 100 %     Weight 220 lb (99.8 kg)     Height 5\' 10"  (1.778 m)     Head Circumference      Peak Flow      Pain Score 0     Pain Loc      Pain Edu?      Excl. in GC?     Constitutional: Alert and  oriented. Well appearing and in no acute distress. Eyes: Conjunctivae are normal.  Head: Atraumatic. Nose: No congestion/rhinnorhea. Mouth/Throat: Mucous membranes are moist.  Neck: No stridor.   Cardiovascular: Tachycardic, regular rhythm. Grossly normal heart sounds.  Good peripheral circulation with Doppler pulses to the bilateral dorsalis pedis. Respiratory: Tachypneic.  No retractions. Lungs CTAB. Gastrointestinal: Soft and nontender. No distention. No CVA tenderness. Musculoskeletal: Moderate edema to the bilateral lower extremities.  Extending from the ankles up to the mid thighs. Neurologic:  Normal speech and language. No gross focal neurologic deficits are appreciated. Skin:  Skin is warm, dry and intact. No rash noted. Psychiatric: Mood and affect are normal. Speech and behavior are normal.  ____________________________________________   LABS (all labs ordered are listed, but only abnormal results are displayed)  Labs Reviewed  BASIC METABOLIC PANEL - Abnormal; Notable for the following components:      Result Value   Glucose, Bld 125 (*)    Creatinine, Ser 1.40 (*)    GFR calc non Af Amer 54 (*)    All other components within normal limits  CBC - Abnormal; Notable for the following components:   RBC 4.19 (*)    RDW 15.1 (*)    Platelets 110 (*)    All other components within normal limits  TROPONIN I - Abnormal; Notable for the following components:   Troponin I 0.03 (*)    All other components within normal limits  BRAIN NATRIURETIC PEPTIDE - Abnormal; Notable for the following components:   B Natriuretic Peptide >4,500.0 (*)    All other components within normal limits   ____________________________________________  EKG  ED ECG REPORT I, Arelia Longest, the attending physician, personally viewed and interpreted this ECG.   Date: 11/13/2017  EKG Time: 0755  Rate: 103  Rhythm: sinus tachycardia  Axis: Left  Intervals:none  ST&T Change: No ST segment  elevation or depression.  T wave inversions in 1, aVL as well as V 3 through V6. No significant change from previous. ____________________________________________  RADIOLOGY  COPD.  No other acute disease.  ____________________________________________   PROCEDURES  Procedure(s) performed:   Procedures  Critical Care performed:   ____________________________________________   INITIAL IMPRESSION / ASSESSMENT AND PLAN / ED COURSE  Pertinent labs & imaging results that were available during my care of the patient were reviewed by me and considered in my medical decision making (see chart for details).  Differential includes, but is not limited to, viral syndrome, bronchitis including COPD exacerbation, pneumonia, reactive airway disease including asthma, CHF including exacerbation with or without pulmonary/interstitial edema, pneumothorax, ACS, thoracic trauma, and pulmonary embolism. As part of my medical decision making, I reviewed  the following data within the electronic MEDICAL RECORD NUMBER Notes from prior ED visits   ----------------------------------------- 12:55 PM on 11/13/2017 -----------------------------------------  Patient with persistent shortness of breath despite Lasix.  BNP greater than 4500.  Will admit to the hospital.  Signed out to Dr. Elisabeth Pigeon.  Patient aware of the treatment plan willing to comply.     ____________________________________________   FINAL CLINICAL IMPRESSION(S) / ED DIAGNOSES  CHF.  Crack cocaine abuse    NEW MEDICATIONS STARTED DURING THIS VISIT:  New Prescriptions   No medications on file     Note:  This document was prepared using Dragon voice recognition software and may include unintentional dictation errors.     Myrna Blazer, MD 11/13/17 1256

## 2017-11-13 NOTE — Plan of Care (Signed)
  Progressing Education: Knowledge of General Education information will improve 11/13/2017 1845 - Progressing by Jerene Dilling, RN Clinical Measurements: Ability to maintain clinical measurements within normal limits will improve 11/13/2017 1845 - Progressing by Jerene Dilling, RN Safety: Ability to remain free from injury will improve 11/13/2017 1845 - Progressing by Jerene Dilling, RN

## 2017-11-13 NOTE — Progress Notes (Signed)
Family Meeting Note  Advance Directive:yes  Today a meeting took place with the Patient.  The following clinical team members were present during this meeting:MD  The following were discussed:Patient's diagnosis: Severe systolic congestive heart failure, alcoholism and smoking , Patient's progosis: Unable to determine and Goals for treatment: Full Code  Additional follow-up to be provided: Cardiology consult.  Time spent during discussion:20 minutes  Altamese Dilling, MD

## 2017-11-14 ENCOUNTER — Inpatient Hospital Stay: Payer: BLUE CROSS/BLUE SHIELD

## 2017-11-14 LAB — HEMOGLOBIN A1C
Hgb A1c MFr Bld: 5.5 % (ref 4.8–5.6)
Mean Plasma Glucose: 111.15 mg/dL

## 2017-11-14 LAB — BASIC METABOLIC PANEL
ANION GAP: 18 — AB (ref 5–15)
BUN: 27 mg/dL — ABNORMAL HIGH (ref 6–20)
CALCIUM: 9.1 mg/dL (ref 8.9–10.3)
CHLORIDE: 100 mmol/L — AB (ref 101–111)
CO2: 21 mmol/L — AB (ref 22–32)
CREATININE: 1.47 mg/dL — AB (ref 0.61–1.24)
GFR calc non Af Amer: 50 mL/min — ABNORMAL LOW (ref >60)
GFR, EST AFRICAN AMERICAN: 59 mL/min — AB (ref >60)
Glucose, Bld: 204 mg/dL — ABNORMAL HIGH (ref 65–99)
Potassium: 4.4 mmol/L (ref 3.5–5.1)
SODIUM: 139 mmol/L (ref 135–145)

## 2017-11-14 LAB — CBC
HCT: 44.3 % (ref 40.0–52.0)
HEMOGLOBIN: 14.1 g/dL (ref 13.0–18.0)
MCH: 31.5 pg (ref 26.0–34.0)
MCHC: 31.8 g/dL — ABNORMAL LOW (ref 32.0–36.0)
MCV: 98.8 fL (ref 80.0–100.0)
PLATELETS: 115 10*3/uL — AB (ref 150–440)
RBC: 4.48 MIL/uL (ref 4.40–5.90)
RDW: 15.1 % — ABNORMAL HIGH (ref 11.5–14.5)
WBC: 3.3 10*3/uL — AB (ref 3.8–10.6)

## 2017-11-14 LAB — LIPID PANEL
CHOL/HDL RATIO: 1.9 ratio
CHOLESTEROL: 146 mg/dL (ref 0–200)
HDL: 78 mg/dL (ref >40)
LDL Cholesterol: 55 mg/dL (ref 0–99)
Triglycerides: 65 mg/dL (ref <150)
VLDL: 13 mg/dL (ref 0–40)

## 2017-11-14 MED ORDER — FUROSEMIDE 10 MG/ML IJ SOLN
80.0000 mg | Freq: Two times a day (BID) | INTRAMUSCULAR | Status: DC
  Administered 2017-11-14 – 2017-11-16 (×4): 80 mg via INTRAVENOUS
  Filled 2017-11-14 (×4): qty 8

## 2017-11-14 MED ORDER — MAGNESIUM SULFATE 2 GM/50ML IV SOLN
2.0000 g | Freq: Once | INTRAVENOUS | Status: AC
  Administered 2017-11-14: 2 g via INTRAVENOUS
  Filled 2017-11-14: qty 50

## 2017-11-14 MED ORDER — LEVOTHYROXINE SODIUM 50 MCG PO TABS: 50.0000 ug | ORAL_TABLET | Freq: Every day | ORAL | Status: DC

## 2017-11-14 MED ORDER — FUROSEMIDE 10 MG/ML IJ SOLN: 40.0000 mg | Freq: Every day | INTRAMUSCULAR | Status: DC

## 2017-11-14 MED ORDER — LEVOTHYROXINE SODIUM 50 MCG PO TABS
50.0000 ug | ORAL_TABLET | Freq: Every day | ORAL | Status: DC
  Administered 2017-11-15 – 2017-11-21 (×6): 50 ug via ORAL
  Filled 2017-11-14 (×6): qty 1

## 2017-11-14 MED ORDER — PNEUMOCOCCAL VAC POLYVALENT 25 MCG/0.5ML IJ INJ
0.5000 mL | INJECTION | INTRAMUSCULAR | Status: AC
  Administered 2017-11-15: 0.5 mL via INTRAMUSCULAR
  Filled 2017-11-14: qty 0.5

## 2017-11-14 NOTE — Progress Notes (Signed)
Magnesium level noted.  Dr Tobi Bastos made aware.  Orders given.

## 2017-11-14 NOTE — Progress Notes (Signed)
Progress Note  Patient Name: Marcus Foster Date of Encounter: 11/14/2017  Primary Cardiologist: Mariah Milling  Subjective   Dyspnea is improved but still some sob and leg swelling.  Inpatient Medications    Scheduled Meds: . atorvastatin  40 mg Oral Daily  . carvedilol  6.25 mg Oral BID WC  . folic acid  1 mg Oral Daily  . [START ON 11/15/2017] furosemide  40 mg Intravenous Daily  . heparin  5,000 Units Subcutaneous Q8H  . levothyroxine  50 mcg Oral QAC breakfast  . methylPREDNISolone (SOLU-MEDROL) injection  60 mg Intravenous Q6H  . multivitamin with minerals  1 tablet Oral Daily  . [START ON 11/15/2017] pneumococcal 23 valent vaccine  0.5 mL Intramuscular Tomorrow-1000  . sacubitril-valsartan  1 tablet Oral BID  . sodium chloride flush  3 mL Intravenous Q12H  . thiamine  100 mg Oral Daily   Continuous Infusions: . magnesium sulfate 1 - 4 g bolus IVPB     PRN Meds: docusate sodium, ipratropium-albuterol, LORazepam, sodium chloride flush   Vital Signs    Vitals:   11/13/17 2103 11/14/17 0213 11/14/17 0533 11/14/17 0809  BP:   (!) 124/96 (!) 121/92  Pulse:   (!) 101 (!) 103  Resp:   18 16  Temp:    97.6 F (36.4 C)  TempSrc:    Oral  SpO2: 99%  100% 100%  Weight:  209 lb 11.2 oz (95.1 kg)    Height:        Intake/Output Summary (Last 24 hours) at 11/14/2017 1009 Last data filed at 11/14/2017 0509 Gross per 24 hour  Intake 290 ml  Output 1150 ml  Net -860 ml   Filed Weights   11/13/17 0751 11/13/17 1620 11/14/17 0213  Weight: 220 lb (99.8 kg) 213 lb (96.6 kg) 209 lb 11.2 oz (95.1 kg)    Telemetry    nsr - Personally Reviewed  ECG   nsr   Physical Exam   GEN: No acute distress.   Neck: 7 cm JVD Cardiac: RRR, no murmurs, rubs, or gallops.  Respiratory: Clear to auscultation bilaterally. GI: Soft, nontender, non-distended  MS: 2+ woody edema; No deformity. Neuro:  Nonfocal  Psych: Normal affect   Labs    Chemistry Recent Labs  Lab 11/13/17 0755  11/13/17 2200 11/14/17 0602  NA 140  --  139  K 4.5  --  4.4  CL 101  --  100*  CO2 24  --  21*  GLUCOSE 125*  --  204*  BUN 19  --  27*  CREATININE 1.40*  --  1.47*  CALCIUM 9.4  --  9.1  PROT  --  7.6  --   ALBUMIN  --  3.7  --   AST  --  44*  --   ALT  --  18  --   ALKPHOS  --  98  --   BILITOT  --  3.2*  --   GFRNONAA 54*  --  50*  GFRAA >60  --  59*  ANIONGAP 15  --  18*     Hematology Recent Labs  Lab 11/13/17 0755 11/14/17 0602  WBC 4.8 3.3*  RBC 4.19* 4.48  HGB 13.3 14.1  HCT 41.4 44.3  MCV 98.8 98.8  MCH 31.8 31.5  MCHC 32.2 31.8*  RDW 15.1* 15.1*  PLT 110* 115*    Cardiac Enzymes Recent Labs  Lab 11/13/17 0755 11/13/17 1439 11/13/17 2024  TROPONINI 0.03* 0.03* <0.03  No results for input(s): TROPIPOC in the last 168 hours.   BNP Recent Labs  Lab 11/13/17 0755  BNP >4,500.0*     DDimer No results for input(s): DDIMER in the last 168 hours.   Radiology    Dg Chest 2 View  Result Date: 11/14/2017 CLINICAL DATA:  Shortness of breath, CHF EXAM: CHEST  2 VIEW COMPARISON:  11/13/2017 FINDINGS: The lungs are hyperinflated likely secondary to COPD. There is no focal consolidation. There is no pleural effusion or pneumothorax. There is stable cardiomegaly. There is no acute osseous abnormality. There is a bony protuberance arising from the distal tip of the scapula likely reflecting sequela prior trauma versus an osteochondroma. No scratch at there are no aggressive features. IMPRESSION: No active cardiopulmonary disease. Electronically Signed   By: Elige Ko   On: 11/14/2017 08:51   Dg Chest 2 View  Result Date: 11/13/2017 CLINICAL DATA:  Pt c/o shortness of breath, AND cough. Pt states he has had a chronic cough x yrs Hx of asthma, CHF, and hypertension EXAM: CHEST - 2 VIEW COMPARISON:  09/06/2017 FINDINGS: Lungs are clear,  hyperinflated. Stable mild cardiomegaly. No effusion. Visualized bones unremarkable. IMPRESSION: Pulmonary hyperinflation, no  acute cardiopulmonary disease. Electronically Signed   By: Corlis Leak M.D.   On: 11/13/2017 08:20    Cardiac Studies    Patient Profile     57 y.o. male  Marcus Foster is a 57 y.o. male with a hx of chronic systolic CHF diagnosed in 08/2017 with an EF of 20-25% by TTE without prior ischemic workup, polysubstance abuse with ongoing cocaine, alcohol, and tobacco abuse, medication noncompliance, HTN, thrombocytopenia, and obesity who is being seen today for the evaluation of acute on chronic systolic CHF/biventricular failure at the request of Dr. Elisabeth Pigeon.    Assessment & Plan    1  Acute on chronic systolic CHF -  Pt had been noncompliant with meds  Presented on 2/1 with SOB , edema and weight up over 25 lbs. He has diuresed over 10 lbs. He will continue IV lasix and probably need another 10-15 lbs of diuresis  2.  Polysubstanc abuse - currently has cocaine in UDS  3  HTN  Meds added back. With findings in above, I question degree of compliance.  4  Elev troponin  Minimal - I could not find where he had previously undergone left heart cath. At some point,  Consideration for left and right heart cath will need to be addressed.  For questions or updates, please contact [facility] Please consult [email or web address] for contact info under Cardiology/STEMI.      Signed, Lewayne Bunting, MD  11/14/2017, 10:09 AM

## 2017-11-14 NOTE — Clinical Social Work Note (Signed)
CSW received consult that the patient has difficulty obtaining medications. This need is a role of the RNCM. Please consult for such. CSW will visit the patient to discuss substance use and provide resources when able.  Argentina Ponder, MSW, Theresia Majors [phone]

## 2017-11-14 NOTE — Progress Notes (Signed)
Notified of 5 beat run SVT on telemetry.  Patient with non productive coughing episode noted.  Denies palpitations. No distress noted.

## 2017-11-14 NOTE — Progress Notes (Signed)
[employer] - [place] at [facility]   PATIENT NAME: Marcus Foster    MR#:  [id number]  DATE OF BIRTH:  [birth year]  SUBJECTIVE:  CHIEF COMPLAINT:   Chief Complaint  Patient presents with  . Shortness of Breath  . Congestive Heart Failure     Came with weight gain, edema, congestive heart failure.  Feels slightly better today.   REVIEW OF SYSTEMS:  CONSTITUTIONAL: No fever, fatigue or weakness.  EYES: No blurred or double vision.  EARS, NOSE, AND THROAT: No tinnitus or ear pain.  RESPIRATORY: No cough, shortness of breath, wheezing or hemoptysis.  CARDIOVASCULAR: No chest pain, orthopnea, edema.  GASTROINTESTINAL: No nausea, vomiting, diarrhea or abdominal pain.  GENITOURINARY: No dysuria, hematuria.  ENDOCRINE: No polyuria, nocturia,  HEMATOLOGY: No anemia, easy bruising or bleeding SKIN: No rash or lesion. MUSCULOSKELETAL: No joint pain or arthritis.   NEUROLOGIC: No tingling, numbness, weakness.  PSYCHIATRY: No anxiety or depression.   ROS  DRUG ALLERGIES:  No Known Allergies  VITALS:  Blood pressure (!) 121/92, pulse (!) 103, temperature 97.6 F (36.4 C), temperature source Oral, resp. rate 16, height 5\' 10"  (1.778 m), weight 95.1 kg (209 lb 11.2 oz), SpO2 100 %.  PHYSICAL EXAMINATION:   GENERAL:  57 y.o.-year-old patient lying in the bed with no acute distress.  EYES: Pupils equal, round, reactive to light and accommodation. No scleral icterus. Extraocular muscles intact.  HEENT: Head atraumatic, normocephalic. Oropharynx and nasopharynx clear.  NECK:  Supple, no jugular venous distention. No thyroid enlargement, no tenderness.  LUNGS: Normal breath sounds bilaterally, no wheezing, some crepitation. No use of accessory muscles of respiration.  CARDIOVASCULAR: S1, S2 normal. No murmurs, rubs, or gallops.  ABDOMEN: Soft, nontender, nondistended. Bowel sounds present. No organomegaly or mass.  EXTREMITIES: Bilateral excessive pedal edema, extending up to  his thighs and lower abdominal wall, no cyanosis, or clubbing.  NEUROLOGIC: Cranial nerves II through XII are intact. Muscle strength 5/5 in all extremities. Sensation intact. Gait not checked.  PSYCHIATRIC: The patient is alert and oriented x 3.  SKIN: No obvious rash, lesion, or ulcer.    Physical Exam LABORATORY PANEL:   CBC Recent Labs  Lab 11/14/17 0602  WBC 3.3*  HGB 14.1  HCT 44.3  PLT 115*   ------------------------------------------------------------------------------------------------------------------  Chemistries  Recent Labs  Lab 11/13/17 2200 11/14/17 0602  NA  --  139  K  --  4.4  CL  --  100*  CO2  --  21*  GLUCOSE  --  204*  BUN  --  27*  CREATININE  --  1.47*  CALCIUM  --  9.1  MG 1.1*  --   AST 44*  --   ALT 18  --   ALKPHOS 98  --   BILITOT 3.2*  --    ------------------------------------------------------------------------------------------------------------------  Cardiac Enzymes Recent Labs  Lab 11/13/17 1439 11/13/17 2024  TROPONINI 0.03* <0.03   ------------------------------------------------------------------------------------------------------------------  RADIOLOGY:  Dg Chest 2 View  Result Date: 11/14/2017 CLINICAL DATA:  Shortness of breath, CHF EXAM: CHEST  2 VIEW COMPARISON:  11/13/2017 FINDINGS: The lungs are hyperinflated likely secondary to COPD. There is no focal consolidation. There is no pleural effusion or pneumothorax. There is stable cardiomegaly. There is no acute osseous abnormality. There is a bony protuberance arising from the distal tip of the scapula likely reflecting sequela prior trauma versus an osteochondroma. No scratch at there are no aggressive features. IMPRESSION: No active cardiopulmonary disease. Electronically Signed   By: Alan Ripper  Patel   On: 11/14/2017 08:51   Dg Chest 2 View  Result Date: 11/13/2017 CLINICAL DATA:  Pt c/o shortness of breath, AND cough. Pt states he has had a chronic cough x yrs Hx of  asthma, CHF, and hypertension EXAM: CHEST - 2 VIEW COMPARISON:  09/06/2017 FINDINGS: Lungs are clear,  hyperinflated. Stable mild cardiomegaly. No effusion. Visualized bones unremarkable. IMPRESSION: Pulmonary hyperinflation, no acute cardiopulmonary disease. Electronically Signed   By: Corlis Leak M.D.   On: 11/13/2017 08:20    ASSESSMENT AND PLAN:   Principal Problem:   Acute on chronic systolic CHF (congestive heart failure) (HCC) Active Problems:   HTN (hypertension)   Alcohol abuse   Smoker   Cocaine abuse (HCC)   Nonischemic cardiomyopathy (HCC)   * Generalized edema,   Acute on chronic systolic congestive heart failure    IV Lasix, restriction on fluid, intake and output measurement.1400 ml negative fluid balance so far.   Monitor on telemetry, serial troponin, cardiology consult appreciated.   Recent echocardiogram showed EF of 25%.   Continue home medications like Coreg, atorvastatin.   As per pharmacy record, patient is not taking enteresto- so I will leave it up to cardiologist to start that.   Cardiology also suggesting to do check for ischemia workup, plan per them.  * Hypertension   Continue carvedilol.  * CKD stage III   Monitor with IV Lasix.  * Alcohol abuse   Continue vitamin supplementation, we may help to keep him on CIWA if any withdrawal symptoms, currently none.  * Smoking   Counseled to quit smoking for 4 minutes and offered nicotine patch.  * elevated LFTs   Patient is alcoholic, monitor tomorrow.  * Hypomagnesemia, due to alcoholism   Replace IV, check tomorrow.  * Hypothyroidism   Start on low-dose levothyroxine.  All the records are reviewed and case discussed with Care Management/Social Workerr. Management plans discussed with the patient, family and they are in agreement.  CODE STATUS: Full.  TOTAL TIME TAKING CARE OF THIS PATIENT: 35 minutes.    POSSIBLE D/C IN 1-2 DAYS, DEPENDING ON CLINICAL CONDITION.   Altamese Dilling M.D on 11/14/2017   Between 7am to 6pm - Pager - [phone]  After 6pm go to [email or web address] - password [employer]  [facility] [place] [facility]  Office  [phone]  CC: Primary care physician; Armando Gang, FNP  Note: This dictation was prepared with Dragon dictation along with smaller phrase technology. Any transcriptional errors that result from this process are unintentional.

## 2017-11-14 NOTE — Plan of Care (Signed)
Voiding without difficulty.  Less edema to legs and weight loss noted on scale this shift.

## 2017-11-15 LAB — COMPREHENSIVE METABOLIC PANEL
ALT: 16 U/L — ABNORMAL LOW (ref 17–63)
ANION GAP: 18 — AB (ref 5–15)
AST: 32 U/L (ref 15–41)
Albumin: 3.5 g/dL (ref 3.5–5.0)
Alkaline Phosphatase: 92 U/L (ref 38–126)
BILIRUBIN TOTAL: 1.7 mg/dL — AB (ref 0.3–1.2)
BUN: 41 mg/dL — AB (ref 6–20)
CO2: 20 mmol/L — ABNORMAL LOW (ref 22–32)
Calcium: 8.8 mg/dL — ABNORMAL LOW (ref 8.9–10.3)
Chloride: 96 mmol/L — ABNORMAL LOW (ref 101–111)
Creatinine, Ser: 1.59 mg/dL — ABNORMAL HIGH (ref 0.61–1.24)
GFR calc Af Amer: 53 mL/min — ABNORMAL LOW (ref >60)
GFR, EST NON AFRICAN AMERICAN: 46 mL/min — AB (ref >60)
Glucose, Bld: 344 mg/dL — ABNORMAL HIGH (ref 65–99)
POTASSIUM: 4.8 mmol/L (ref 3.5–5.1)
Sodium: 134 mmol/L — ABNORMAL LOW (ref 135–145)
TOTAL PROTEIN: 7.2 g/dL (ref 6.5–8.1)

## 2017-11-15 LAB — MAGNESIUM: MAGNESIUM: 1.7 mg/dL (ref 1.7–2.4)

## 2017-11-15 MED ORDER — VITAMIN B-1 100 MG PO TABS
100.0000 mg | ORAL_TABLET | Freq: Every day | ORAL | Status: DC
  Administered 2017-11-18 – 2017-11-21 (×4): 100 mg via ORAL
  Filled 2017-11-15 (×4): qty 1

## 2017-11-15 MED ORDER — THIAMINE HCL 100 MG/ML IJ SOLN
100.0000 mg | Freq: Every day | INTRAMUSCULAR | Status: AC
  Administered 2017-11-15: 100 mg via INTRAVENOUS
  Filled 2017-11-15 (×3): qty 2

## 2017-11-15 MED ORDER — PREMIER PROTEIN SHAKE
11.0000 [oz_av] | Freq: Two times a day (BID) | ORAL | Status: DC
  Administered 2017-11-15 – 2017-11-21 (×7): 11 [oz_av] via ORAL

## 2017-11-15 NOTE — Plan of Care (Signed)
Patient needs help with diet management. Place a dietary consult to help patient read labels and understand dietary restrictions.

## 2017-11-15 NOTE — Progress Notes (Signed)
Initial Nutrition Assessment  DOCUMENTATION CODES:   Not applicable  INTERVENTION:  Patient is at risk for web beri beri. Discussed with MD.   Patient now ordered for thiamine 100 mg IV daily for 3 days followed by thiamine 100 mg daily PO. Continue MVI daily and folic acid 1 mg daily. Discussed with patient importance of continuing to take MVI, thiamine, and folic acid daily after discharge.  Provide Premier Protein po BID, each supplement provides 160 kcal and 30 grams of protein.  Recommend checking phosphorus and supplementing as needed. Patient is at risk for refeeding syndrome. Potassium and Magnesium already being monitored.  Reviewed low sodium education (note to follow).  NUTRITION DIAGNOSIS:   Increased nutrient needs(thiamine, folic acid) related to chronic illness(EtOH abuse) as evidenced by estimated needs.  GOAL:   Patient will meet greater than or equal to 90% of their needs  MONITOR:   PO intake, Supplement acceptance, Labs, Weight trends, I & O's  REASON FOR ASSESSMENT:   Consult Diet education  ASSESSMENT:   57 year old male with PMHx of asthma, HTN, acid reflux, chronic systolic CHF, polysubstance abuse, EtOH abuse who is now admitted with acute on chronic systolic CHF, CKD stage III.   -On recent admission patient had echocardiogram with findings of EF 20-25%. Patient's heart failure thought to be related to chronic alcohol abuse.  Met with patient at bedside. He reports he has had a decreased appetite for the past month. He reports he has been tired of eating. He has only been eating one meal per day - usually cereal with milk and applesauce. Only having snacks occasionally. Patient reports his appetite has improved some here and he is eating better. Noted meal completion of 50-100%. He takes MVI daily at home. He is amenable to drinking Premier Protein to help meet protein/calorie needs. Also discussed vitamins/minerals patient should continue to take at  home.  Patient reports his weight is difficult to trend in setting of fluid changes. He usually gets fluid built-up on his lower extremities and occasionally his abdomen. He reports his dry weight was around 180 lbs before fluid built-up. Per chart patient was 182.8 lbs on 09/09/2017.  Medications reviewed and include: folic acid 1 mg daily, levothyroxine, Solu-Medrol 60 mg Q6hrs IV, MVI daily, thiamine 100 mg daily IV for 3 days, then thiamine 100 mg daily PO.  Labs reviewed: Sodium 134, Chloride 96, CO2 20, BUN 41, Creatinine 1.59. On 09/08/2017 patient's vitamin B1 (thiamine) level was 85.5, but patient had already been receiving thiamine supplementation.  Patient is at risk for malnutrition but does not meet criteria for malnutrition at this time.  Discussed with RN and MD.  NUTRITION - FOCUSED PHYSICAL EXAM:    Most Recent Value  Orbital Region  No depletion  Upper Arm Region  Mild depletion  Thoracic and Lumbar Region  No depletion  Buccal Region  No depletion  Temple Region  No depletion  Clavicle Bone Region  No depletion  Clavicle and Acromion Bone Region  No depletion  Scapular Bone Region  No depletion  Dorsal Hand  No depletion  Patellar Region  Unable to assess  Anterior Thigh Region  Unable to assess  Posterior Calf Region  Unable to assess  Edema (RD Assessment)  -- [non-pitting edema to bilateral lower extremities]  Hair  Reviewed  Eyes  Reviewed  Mouth  Reviewed  Skin  Reviewed  Nails  Reviewed     Diet Order:  Diet Heart Room service appropriate? Yes;  Fluid consistency: Thin  EDUCATION NEEDS:   Education needs have been addressed  Skin:  Skin Assessment: Reviewed RN Assessment  Last BM:  11/15/2017  Height:   Ht Readings from Last 1 Encounters:  11/13/17 '5\' 10"'  (1.778 m)    Weight:   Wt Readings from Last 1 Encounters:  11/15/17 213 lb 4.8 oz (96.8 kg)    Ideal Body Weight:  75.5 kg  BMI:  Body mass index is 30.61 kg/m.  Estimated  Nutritional Needs:   Kcal:  1970-2150 (MSJ x 1.1-1.2)  Protein:  95-105 grams (1-1.1 grams/kg)  Fluid:  1.8 L/day (25 mL/kg IBW)  Willey Blade, MS, RD, LDN Office: [phone] Pager: [phone] After Hours/Weekend Pager: [phone]

## 2017-11-15 NOTE — Care Management Note (Signed)
Case Management Note  Patient Details  Name: Marcus Foster MRN: [id number] Date of Birth: 1961-09-04  Subjective/Objective:                 Admitted with CHF.  From previous admission in Nov 2018, patient has scales.  Was seen at [facility]. Cardiology office note of 1.5.2019, patient was to "continue"  entresto and give coupon for co pay assist as he had commercial insurance.  Sherryll Burger is not listed as current medication in the h/p.  Positive for cocaine    Action/Plan CM to discuss medications with patient and his ability to obtain.  Expected Discharge Date:                  Expected Discharge Plan:     In-House Referral:     Discharge planning Services     Post Acute Care Choice:    Choice offered to:     DME Arranged:    DME Agency:     HH Arranged:    HH Agency:     Status of Service:     If discussed at [employer], dates discussed:    Additional Comments:  Eber Hong, RN 11/15/2017, 1:04 PM

## 2017-11-15 NOTE — Plan of Care (Signed)
Nutrition Education Note  RD consulted for nutrition education regarding CHF.  RD provided "Heart Failure Nutrition Therapy" handout from the Academy of Nutrition and Dietetics. Reviewed patient's dietary recall. Provided examples on ways to decrease sodium intake in diet. Discouraged intake of processed foods and use of salt shaker. Encouraged fresh fruits and vegetables as well as whole grain sources of carbohydrates to maximize fiber intake.   RD discussed why it is important for patient to adhere to diet recommendations, and emphasized the role of fluids, foods to avoid, and importance of weighing self daily. Teach back method used.  Expect poor to fair compliance.  Body mass index is 30.61 kg/m. Pt meets criteria for obesity class I based on current BMI, however current weight falsely elevated with fluid.  Current diet order is Heart Healthy, patient is consuming approximately 50-100% of meals at this time.   Helane Rima, MS, RD, LDN Office: [phone] Pager: [phone] After Hours/Weekend Pager: [phone]

## 2017-11-15 NOTE — Progress Notes (Signed)
[employer] - [place] at [facility]   PATIENT NAME: Marcus Foster    MR#:  [id number]  DATE OF BIRTH:  1961-06-30  SUBJECTIVE:  CHIEF COMPLAINT:   Chief Complaint  Patient presents with  . Shortness of Breath  . Congestive Heart Failure     Came with weight gain, edema, congestive heart failure.  Feels slightly better today.   REVIEW OF SYSTEMS:  CONSTITUTIONAL: No fever, fatigue or weakness.  EYES: No blurred or double vision.  EARS, NOSE, AND THROAT: No tinnitus or ear pain.  RESPIRATORY: No cough, shortness of breath, wheezing or hemoptysis.  CARDIOVASCULAR: No chest pain, orthopnea, edema.  GASTROINTESTINAL: No nausea, vomiting, diarrhea or abdominal pain.  GENITOURINARY: No dysuria, hematuria.  ENDOCRINE: No polyuria, nocturia,  HEMATOLOGY: No anemia, easy bruising or bleeding SKIN: No rash or lesion. MUSCULOSKELETAL: No joint pain or arthritis.   NEUROLOGIC: No tingling, numbness, weakness.  PSYCHIATRY: No anxiety or depression.   ROS  DRUG ALLERGIES:  No Known Allergies  VITALS:  Blood pressure 129/90, pulse 91, temperature 98.4 F (36.9 C), resp. rate 16, height 5\' 10"  (1.778 m), weight 96.8 kg (213 lb 4.8 oz), SpO2 100 %.  PHYSICAL EXAMINATION:   GENERAL:  57 y.o.-year-old patient lying in the bed with no acute distress.  EYES: Pupils equal, round, reactive to light and accommodation. No scleral icterus. Extraocular muscles intact.  HEENT: Head atraumatic, normocephalic. Oropharynx and nasopharynx clear.  NECK:  Supple, no jugular venous distention. No thyroid enlargement, no tenderness.  LUNGS: Normal breath sounds bilaterally, no wheezing, some crepitation. No use of accessory muscles of respiration.  CARDIOVASCULAR: S1, S2 normal. No murmurs, rubs, or gallops.  ABDOMEN: Soft, nontender, nondistended. Bowel sounds present. No organomegaly or mass.  EXTREMITIES: Bilateral excessive pedal edema, extending up to his thighs and lower abdominal  wall, no cyanosis, or clubbing.  NEUROLOGIC: Cranial nerves II through XII are intact. Muscle strength 5/5 in all extremities. Sensation intact. Gait not checked.  PSYCHIATRIC: The patient is alert and oriented x 3.  SKIN: No obvious rash, lesion, or ulcer.    Physical Exam LABORATORY PANEL:   CBC Recent Labs  Lab 11/14/17 0602  WBC 3.3*  HGB 14.1  HCT 44.3  PLT 115*   ------------------------------------------------------------------------------------------------------------------  Chemistries  Recent Labs  Lab 11/15/17 0546  NA 134*  K 4.8  CL 96*  CO2 20*  GLUCOSE 344*  BUN 41*  CREATININE 1.59*  CALCIUM 8.8*  MG 1.7  AST 32  ALT 16*  ALKPHOS 92  BILITOT 1.7*   ------------------------------------------------------------------------------------------------------------------  Cardiac Enzymes Recent Labs  Lab 11/13/17 1439 11/13/17 2024  TROPONINI 0.03* <0.03   ------------------------------------------------------------------------------------------------------------------  RADIOLOGY:  Dg Chest 2 View  Result Date: 11/14/2017 CLINICAL DATA:  Shortness of breath, CHF EXAM: CHEST  2 VIEW COMPARISON:  11/13/2017 FINDINGS: The lungs are hyperinflated likely secondary to COPD. There is no focal consolidation. There is no pleural effusion or pneumothorax. There is stable cardiomegaly. There is no acute osseous abnormality. There is a bony protuberance arising from the distal tip of the scapula likely reflecting sequela prior trauma versus an osteochondroma. No scratch at there are no aggressive features. IMPRESSION: No active cardiopulmonary disease. Electronically Signed   By: Elige Ko   On: 11/14/2017 08:51    ASSESSMENT AND PLAN:   Principal Problem:   Acute on chronic systolic CHF (congestive heart failure) (HCC) Active Problems:   HTN (hypertension)   Alcohol abuse   Smoker   Cocaine abuse (HCC)  Nonischemic cardiomyopathy (HCC)   * Generalized  edema,   Acute on chronic systolic congestive heart failure    IV Lasix- increased dose, restriction on fluid, intake and output measurement.1900 ml negative fluid balance so far.   Monitor on telemetry, serial troponin, cardiology consult appreciated.   Recent echocardiogram showed EF of 25%.   Continue home medications like Coreg, atorvastatin.   As per pharmacy record, patient is not taking enteresto- so I will leave it up to cardiologist to start that.   Cardiology also suggesting to do check for ischemia workup, plan per them.  * Hypertension   Continue carvedilol.  * CKD stage III   Monitor with IV Lasix.  * Alcohol abuse   Continue vitamin supplementation, we may help to keep him on CIWA if any withdrawal symptoms, currently none.   Vitamin supplementation.  * Smoking   Counseled to quit smoking for 4 minutes and offered nicotine patch.  * elevated LFTs   Patient is alcoholic, monitor tomorrow.  * Hypomagnesemia, due to alcoholism   Replace IV, check tomorrow.  * Hypothyroidism   Start on low-dose levothyroxine.  All the records are reviewed and case discussed with Care Management/Social Workerr. Management plans discussed with the patient, family and they are in agreement.  CODE STATUS: Full.  TOTAL TIME TAKING CARE OF THIS PATIENT: 35 minutes.    POSSIBLE D/C IN 1-2 DAYS, DEPENDING ON CLINICAL CONDITION.   Altamese Dilling M.D on 11/15/2017   Between 7am to 6pm - Pager - [phone]  After 6pm go to [email or web address] - password [employer]  [facility] [place] [facility]  Office  [phone]  CC: Primary care physician; Armando Gang, FNP  Note: This dictation was prepared with Dragon dictation along with smaller phrase technology. Any transcriptional errors that result from this process are unintentional.

## 2017-11-15 NOTE — Progress Notes (Signed)
Progress Note  Patient Name: Marcus Foster Date of Encounter: 11/15/2017  Primary Cardiologist: No primary care provider on file.   Subjective   Comfortable but still with dyspnea on standing and walking.  Inpatient Medications    Scheduled Meds: . atorvastatin  40 mg Oral Daily  . carvedilol  6.25 mg Oral BID WC  . folic acid  1 mg Oral Daily  . furosemide  80 mg Intravenous Q12H  . heparin  5,000 Units Subcutaneous Q8H  . levothyroxine  50 mcg Oral QAC breakfast  . methylPREDNISolone (SOLU-MEDROL) injection  60 mg Intravenous Q6H  . multivitamin with minerals  1 tablet Oral Daily  . sacubitril-valsartan  1 tablet Oral BID  . sodium chloride flush  3 mL Intravenous Q12H  . thiamine injection  100 mg Intravenous Daily  . [START ON 11/18/2017] thiamine  100 mg Oral Daily   Continuous Infusions:  PRN Meds: docusate sodium, ipratropium-albuterol, LORazepam, sodium chloride flush   Vital Signs    Vitals:   11/14/17 1603 11/14/17 1921 11/15/17 0313 11/15/17 0815  BP: 123/88 117/83 115/84 129/90  Pulse: 97 96 91 91  Resp: 16 17 17 16   Temp:  98.1 F (36.7 C) (!) 97.3 F (36.3 C) 98.4 F (36.9 C)  TempSrc:  Oral Oral   SpO2: 99% 100% 100% 100%  Weight:   213 lb 4.8 oz (96.8 kg)   Height:        Intake/Output Summary (Last 24 hours) at 11/15/2017 1316 Last data filed at 11/15/2017 1018 Gross per 24 hour  Intake 960 ml  Output 975 ml  Net -15 ml   Filed Weights   11/13/17 1620 11/14/17 0213 11/15/17 0313  Weight: 213 lb (96.6 kg) 209 lb 11.2 oz (95.1 kg) 213 lb 4.8 oz (96.8 kg)    Telemetry    Normal sinus rhythm- Personally Reviewed  ECG    None- Personally Reviewed  Physical Exam   GEN: No acute distress.   Neck:  8 cm JVD Cardiac: RRR, no murmurs, rubs, or gallops.  Respiratory: Clear to auscultation bilaterally except for rales in the bases bilaterally. GI: Soft, nontender, non-distended  MS:  2+ peripheral edema; No deformity. Neuro:  Nonfocal    Psych: Normal affect   Labs    Chemistry Recent Labs  Lab 11/13/17 0755 11/13/17 2200 11/14/17 0602 11/15/17 0546  NA 140  --  139 134*  K 4.5  --  4.4 4.8  CL 101  --  100* 96*  CO2 24  --  21* 20*  GLUCOSE 125*  --  204* 344*  BUN 19  --  27* 41*  CREATININE 1.40*  --  1.47* 1.59*  CALCIUM 9.4  --  9.1 8.8*  PROT  --  7.6  --  7.2  ALBUMIN  --  3.7  --  3.5  AST  --  44*  --  32  ALT  --  18  --  16*  ALKPHOS  --  98  --  92  BILITOT  --  3.2*  --  1.7*  GFRNONAA 54*  --  50* 46*  GFRAA >60  --  59* 53*  ANIONGAP 15  --  18* 18*     Hematology Recent Labs  Lab 11/13/17 0755 11/14/17 0602  WBC 4.8 3.3*  RBC 4.19* 4.48  HGB 13.3 14.1  HCT 41.4 44.3  MCV 98.8 98.8  MCH 31.8 31.5  MCHC 32.2 31.8*  RDW 15.1* 15.1*  PLT 110* 115*    Cardiac Enzymes Recent Labs  Lab 11/13/17 0755 11/13/17 1439 11/13/17 2024  TROPONINI 0.03* 0.03* <0.03   No results for input(s): TROPIPOC in the last 168 hours.   BNP Recent Labs  Lab 11/13/17 0755  BNP >4,500.0*     DDimer No results for input(s): DDIMER in the last 168 hours.   Radiology    Dg Chest 2 View  Result Date: 11/14/2017 CLINICAL DATA:  Shortness of breath, CHF EXAM: CHEST  2 VIEW COMPARISON:  11/13/2017 FINDINGS: The lungs are hyperinflated likely secondary to COPD. There is no focal consolidation. There is no pleural effusion or pneumothorax. There is stable cardiomegaly. There is no acute osseous abnormality. There is a bony protuberance arising from the distal tip of the scapula likely reflecting sequela prior trauma versus an osteochondroma. No scratch at there are no aggressive features. IMPRESSION: No active cardiopulmonary disease. Electronically Signed   By: Elige Ko   On: 11/14/2017 08:51    Cardiac Studies   None  Patient Profile     57 y.o. male admitted with acute on chronic systolic heart failure in the setting of noncompliance.   Assessment & Plan    1.  Acute on chronic systolic  heart failure -  he has been a bit refractory to additional diuresis.  Yesterday, I increased his Lasix.  We will follow his weights carefully.  He might require changing of his diuretic to torsemide or Demadex or metolazone. 2.  Polysubstance abuse -cessation of drug use has been recommended and encouraged 3.  Elevated troponin -he has no clinical symptoms for an acute coronary syndrome.  I will defer decision about right and left heart catheterization.   For questions or updates, please contact [facility] Please consult [email or web address] for contact info under Cardiology/STEMI.      Signed, Lewayne Bunting, MD  11/15/2017, 1:16 PM  Patient ID: Marcus Foster, male   DOB: 1961/01/02, 57 y.o.   MRN: [id number]

## 2017-11-16 LAB — BASIC METABOLIC PANEL
Anion gap: 12 (ref 5–15)
BUN: 51 mg/dL — AB (ref 6–20)
CO2: 23 mmol/L (ref 22–32)
CREATININE: 1.62 mg/dL — AB (ref 0.61–1.24)
Calcium: 8.5 mg/dL — ABNORMAL LOW (ref 8.9–10.3)
Chloride: 95 mmol/L — ABNORMAL LOW (ref 101–111)
GFR calc Af Amer: 52 mL/min — ABNORMAL LOW (ref >60)
GFR calc non Af Amer: 45 mL/min — ABNORMAL LOW (ref >60)
GLUCOSE: 495 mg/dL — AB (ref 65–99)
Potassium: 4.2 mmol/L (ref 3.5–5.1)
Sodium: 130 mmol/L — ABNORMAL LOW (ref 135–145)

## 2017-11-16 LAB — GLUCOSE, CAPILLARY
GLUCOSE-CAPILLARY: 496 mg/dL — AB (ref 65–99)
GLUCOSE-CAPILLARY: 445 mg/dL — AB (ref 65–99)
Glucose-Capillary: 552 mg/dL (ref 65–99)

## 2017-11-16 LAB — PHOSPHORUS: Phosphorus: 4 mg/dL (ref 2.5–4.6)

## 2017-11-16 MED ORDER — FUROSEMIDE 10 MG/ML IJ SOLN
40.0000 mg | Freq: Two times a day (BID) | INTRAMUSCULAR | Status: DC
  Administered 2017-11-16 – 2017-11-17 (×2): 40 mg via INTRAVENOUS
  Filled 2017-11-16 (×2): qty 4

## 2017-11-16 MED ORDER — MAGNESIUM OXIDE 400 (241.3 MG) MG PO TABS
400.0000 mg | ORAL_TABLET | Freq: Every day | ORAL | Status: DC
  Administered 2017-11-16 – 2017-11-21 (×6): 400 mg via ORAL
  Filled 2017-11-16 (×6): qty 1

## 2017-11-16 MED ORDER — INSULIN ASPART 100 UNIT/ML ~~LOC~~ SOLN
0.0000 [IU] | Freq: Every day | SUBCUTANEOUS | Status: DC
  Administered 2017-11-16: 5 [IU] via SUBCUTANEOUS
  Administered 2017-11-19 – 2017-11-20 (×2): 3 [IU] via SUBCUTANEOUS
  Filled 2017-11-16 (×3): qty 1

## 2017-11-16 MED ORDER — INSULIN ASPART 100 UNIT/ML ~~LOC~~ SOLN: 0.0000 [IU] | Freq: Three times a day (TID) | SUBCUTANEOUS | Status: DC

## 2017-11-16 MED ORDER — INSULIN ASPART 100 UNIT/ML ~~LOC~~ SOLN
12.0000 [IU] | Freq: Once | SUBCUTANEOUS | Status: AC
  Administered 2017-11-16: 12 [IU] via SUBCUTANEOUS
  Filled 2017-11-16: qty 1

## 2017-11-16 MED ORDER — INSULIN GLARGINE 100 UNIT/ML ~~LOC~~ SOLN
10.0000 [IU] | Freq: Once | SUBCUTANEOUS | Status: AC
  Administered 2017-11-16: 10 [IU] via SUBCUTANEOUS
  Filled 2017-11-16: qty 0.1

## 2017-11-16 MED ORDER — INSULIN ASPART 100 UNIT/ML ~~LOC~~ SOLN
0.0000 [IU] | Freq: Three times a day (TID) | SUBCUTANEOUS | Status: DC
  Administered 2017-11-16: 9 [IU] via SUBCUTANEOUS
  Administered 2017-11-17 (×2): 3 [IU] via SUBCUTANEOUS
  Administered 2017-11-17: 2 [IU] via SUBCUTANEOUS
  Administered 2017-11-18: 5 [IU] via SUBCUTANEOUS
  Administered 2017-11-18: 3 [IU] via SUBCUTANEOUS
  Administered 2017-11-18: 1 [IU] via SUBCUTANEOUS
  Administered 2017-11-19 (×2): 3 [IU] via SUBCUTANEOUS
  Administered 2017-11-20: 1 [IU] via SUBCUTANEOUS
  Administered 2017-11-20: 3 [IU] via SUBCUTANEOUS
  Administered 2017-11-20: 2 [IU] via SUBCUTANEOUS
  Administered 2017-11-21: 5 [IU] via SUBCUTANEOUS
  Administered 2017-11-21: 2 [IU] via SUBCUTANEOUS
  Filled 2017-11-16 (×14): qty 1

## 2017-11-16 NOTE — Progress Notes (Signed)
Progress Note  Patient Name: Marcus Foster Date of Encounter: 11/16/2017  Primary Cardiologist: Mariah Milling  Subjective   SOB slowly improving. No chest pain. Slept mostly supine the prior night. Net negative 3.4 L for the admission and minus 1.9 L for the past 24 hours. Weight up one pound over the past 24 hours 213-->214. Renal function trend 1.47-->1.59 on 2/3. No labs this AM. Potassium at goal. Magnesium improved from 1.1--1.7.   Inpatient Medications    Scheduled Meds: . atorvastatin  40 mg Oral Daily  . carvedilol  6.25 mg Oral BID WC  . folic acid  1 mg Oral Daily  . furosemide  80 mg Intravenous Q12H  . heparin  5,000 Units Subcutaneous Q8H  . levothyroxine  50 mcg Oral QAC breakfast  . methylPREDNISolone (SOLU-MEDROL) injection  60 mg Intravenous Q6H  . multivitamin with minerals  1 tablet Oral Daily  . protein supplement shake  11 oz Oral BID BM  . sacubitril-valsartan  1 tablet Oral BID  . sodium chloride flush  3 mL Intravenous Q12H  . thiamine injection  100 mg Intravenous Daily  . [START ON 11/18/2017] thiamine  100 mg Oral Daily   Continuous Infusions:  PRN Meds: docusate sodium, ipratropium-albuterol, LORazepam, sodium chloride flush   Vital Signs    Vitals:   11/15/17 1930 11/16/17 0500 11/16/17 0500 11/16/17 0742  BP: (!) 127/93 124/88  (!) 127/93  Pulse: 88 84  86  Resp: 20 18    Temp: 97.8 F (36.6 C)  97.6 F (36.4 C) (!) 97.4 F (36.3 C)  TempSrc: Oral  Oral Oral  SpO2: 96% 96%  97%  Weight:   214 lb 8 oz (97.3 kg)   Height:        Intake/Output Summary (Last 24 hours) at 11/16/2017 0801 Last data filed at 11/16/2017 0700 Gross per 24 hour  Intake 720 ml  Output 2605 ml  Net -1885 ml   Filed Weights   11/14/17 0213 11/15/17 0313 11/16/17 0500  Weight: 209 lb 11.2 oz (95.1 kg) 213 lb 4.8 oz (96.8 kg) 214 lb 8 oz (97.3 kg)    Telemetry    NSR - Personally Reviewed  ECG    n/a - Personally Reviewed  Physical Exam   GEN: No acute  distress.   Neck: JVD elevated to the angle of the mandible. Cardiac: RRR, no murmurs, rubs, or gallops.  Respiratory: Diminished breath sounds bilaterally.  GI: Soft, nontender, distended.   MS: 2+ pitting edema to the bilateral thighs; No deformity. Neuro:  Alert and oriented x 3; Nonfocal.  Psych: Normal affect.  Labs    Chemistry Recent Labs  Lab 11/13/17 0755 11/13/17 2200 11/14/17 0602 11/15/17 0546  NA 140  --  139 134*  K 4.5  --  4.4 4.8  CL 101  --  100* 96*  CO2 24  --  21* 20*  GLUCOSE 125*  --  204* 344*  BUN 19  --  27* 41*  CREATININE 1.40*  --  1.47* 1.59*  CALCIUM 9.4  --  9.1 8.8*  PROT  --  7.6  --  7.2  ALBUMIN  --  3.7  --  3.5  AST  --  44*  --  32  ALT  --  18  --  16*  ALKPHOS  --  98  --  92  BILITOT  --  3.2*  --  1.7*  GFRNONAA 54*  --  50* 46*  GFRAA >60  --  59* 53*  ANIONGAP 15  --  18* 18*     Hematology Recent Labs  Lab 11/13/17 0755 11/14/17 0602  WBC 4.8 3.3*  RBC 4.19* 4.48  HGB 13.3 14.1  HCT 41.4 44.3  MCV 98.8 98.8  MCH 31.8 31.5  MCHC 32.2 31.8*  RDW 15.1* 15.1*  PLT 110* 115*    Cardiac Enzymes Recent Labs  Lab 11/13/17 0755 11/13/17 1439 11/13/17 2024  TROPONINI 0.03* 0.03* <0.03   No results for input(s): TROPIPOC in the last 168 hours.   BNP Recent Labs  Lab 11/13/17 0755  BNP >4,500.0*     DDimer No results for input(s): DDIMER in the last 168 hours.   Radiology    No results found.  Cardiac Studies   TTE 09/07/2017: Study Conclusions  - Left ventricle: The cavity size was mildly dilated. Wall   thickness was increased increased in a pattern of mild to   moderate LVH. Systolic function was severely reduced. The   estimated ejection fraction was in the range of 20% to 25%.   Diffuse hypokinesis. Regional wall motion abnormalities cannot be   excluded. - Aortic valve: Calcified annulus. Mildly thickened leaflets. - Mitral valve: Mildly calcified annulus. Mildly thickened leaflets   .  There was mild to moderate regurgitation. - Left atrium: The atrium was mildly to moderately dilated. - Right ventricle: The cavity size was mildly dilated. Wall   thickness was normal. Systolic function was moderately reduced. - Right atrium: The atrium was mildly dilated. - Pericardium, extracardiac: A trivial pericardial effusion was   identified posterior to the heart.  Patient Profile     57 y.o. male with history of chronic systolic CHF diagnosed in 08/2017 with an EF of 20-25% by TTE without prior ischemic workup, polysubstance abuse with ongoing cocaine, alcohol, and tobacco abuse, medication noncompliance, HTN, thrombocytopenia, and obesity who was admitted 2/1 with acute on chronic systolic CHF in the setting of noncompliance.   Assessment & Plan    1. Acute on chronic systolic CHF: -He remains volume overloaded -Will decrease his IV Lasix to 40 mg for afternoon dose given AKI -May need prn metolazone  -Continue Coreg and Entresto -Consider addition of spironolactone prior to discharge -Will need at [facility] at some point following diuresis to further evaluate his cardiomyopathy, timing to be determined -Strict Is and Os -Daily weights -CHF education -Consider [facility] [facility]  2. AKI: -Decrease IV Lasix as above  3. Polysubstance abuse: -Cessation advised  4. Elevated troponin: -Minimally elevated at 0.03 -Likely supply demand ischemia in the setting of volume overload -Will need R/LHC as above   5. Abnormal TSH: -Per IM  6. Hypomagnesemia: -Improved -Continue to replete to goal > 2.0  For questions or updates, please contact [facility] Please consult [email or web address] for contact info under Cardiology/STEMI.    Signed, Eula Listen, PA-C [facility] Pager: [phone] 11/16/2017, 8:01 AM

## 2017-11-16 NOTE — Progress Notes (Signed)
Notified Dr. Thora Lance of blood sugar of 445, despite having novolog and lantus insulin earlier. Sliding scale ordered for HS. WIll continue to monitor.

## 2017-11-16 NOTE — Plan of Care (Signed)
Patient is having trouble understanding fluids restrictions/guidelines. Continue to use examples at meal and snack time.

## 2017-11-16 NOTE — Progress Notes (Signed)
Inpatient Diabetes Program Recommendations  AACE/ADA: New Consensus Statement on Inpatient Glycemic Control (2015)  Target Ranges:  Prepandial:   less than 140 mg/dL      Peak postprandial:   less than 180 mg/dL (1-2 hours)      Critically ill patients:  140 - 180 mg/dL   Lab Results  Component Value Date   HGBA1C 5.5 11/13/2017    Review of Glycemic ControlResults for NAZARET, NEUFER (MRN [id number]) as of 11/16/2017 14:19  Ref. Range 11/13/2017 07:55 11/14/2017 06:02 11/15/2017 05:46 11/16/2017 03:15  Glucose Latest Ref Range: 65 - 99 mg/dL 616 (H) 073 (H) 710 (H) 495 (H)   Diabetes history: None Current orders for Inpatient glycemic control: None Inpatient Diabetes Program Recommendations:   Note that lab glucose markedly increased with steroids.  Solumedrol d/c'd today.  Sent text page to MD requesting CBG's and possibly Novolog correction.  Alerted RN as well.   Thanks,  Beryl Meager, RN, BC-ADM Inpatient Diabetes Coordinator Pager [phone] (8a-5p)

## 2017-11-16 NOTE — Progress Notes (Addendum)
Patient is a 57 year old male with hx of chronic systolic CHF originally diagnosed in 08/2017 with EF 20 - 25% by TTE without prior ischemic workup, polysubstance abuse with ongoing cocaine, alcohol and tobacco abuse, medication non-compliance, HTN, thrombocytopenia, and obesity.  Patient admitted on 11/13/2017 with acute on chronic systolic CHF in the setting of non-compliance.    Active Problem List this admission included: 1. Acute on Chronic Systolic CHF 2. Will need R/LHC as some point to further evaluate his cardiomyopathy. 3. AKI 4. Polysubstance Abuse 5. Elevated Troponin - likely supply demand ischemia in the setting of volume overload.  Needs R/LHC.   6. Abnormal TSH.   7. Hypomagnesia  Primary Cardiologist:  Dr. Mariah Milling PCP:  Dr. Franco Nones [facility] HF Clinic:  Clarisa Kindred, FNP  CHF Education:   Patient sitting up in bed with [facility] elevated watching TV. ? Educational session with patient completed. Patient had just returned from [place] and was walking around the room.   "Living Better with Heart Failure" packet reviewed with patient. Briefly reviewed definition of heart failure and signs and symptoms of an exacerbation.  Discussed the meaning of EF and his value as compared to normal value.  Explained to patient that HF is a chronic illness which requires self-assessment / self-management along with help from the cardiologist/PCP/HF Clinic.? ?? *Reviewed importance of and reason behind checking weight daily in the AM, after using the bathroom, but before getting dressed.?Patient has scales.?Patient informed this RN that after his discharge in November he did wiehg himself daily.  However, recently he had gotten out of the habit of weighing.  Also, sometimes he ends up staying over at his friend's house for a few days and ends up not taking his medications for a couple of days.  Upon asking patient why he does not take his medications with him he replied, "I do not know I am going to  spend the night there and I would have to ask my friend to take me back home to get the medicine. I know what I am supposed to do.  I'm the problem, as I am the one that has to do it.  I just messed up." Encouraged patient to resume weighing himself daily and performing self-assessment and act on symptoms according the the heart failure zones.   ? Reviewed the following information with patient:  *Discussed when to call the Dr= weight gain of >2-3lb overnight of 5lb in a week,  *Discussed yellow zone= call MD: weight gain of >2-3lb overnight of 5lb in a week, increased swelling, increased SOB when lying down, chest discomfort, dizziness, increased fatigue *Red Zone= call 911: struggle to breath, fainting or near fainting, significant chest pain   *Reviewed low sodium diet-provided handout of recommended and not recommended foods. ?Reviewed reading labels with patient. Discussed fluid intake with patient as well. Patient is currently on a 1200 ml fluid restriction.  Reviewed what this means in terms of liquid consumption with patient.  ?Note: ?Dietitian has seen and educated patient on low sodium heart healthy diet.  Please see Dietitian's note.    *Instructed patient to take medications as prescribed for heart failure. Explained briefly why pt is on the medications (either make you feel better, live longer or keep you out of the hospital) and discussed monitoring and side effects.   *Smoking Cessation?- Patient is a current every day smoker.  Patient informed this RN that he has not smoked a cigarette since he was admitted and prior  to admission he had tapered down to 4 cigarettes per day.  "Thinking of Quitting Tobacco - Yes, You Can!" informational sheet reviewed with patient. Urine drug screen was positive for cocaine. Patient advised against polysubstance abuse.    *Discussed the benefits of exercise.?Discussed Cardiac Rehab.  Back in November 2018 when this RN saw this patient he was not interested  in Cardiac Rehab, as the class times where not compatible with his work schedule.  Patient works schedule:   2 days on, 2 days off, 2 days on, 2 days off, 3 days on, 3 days off, 3 days on, 3 days off and then starts over.  Patient encouraged to remain as active as possible.  Cardiac Rehab referral placed for Chronic Systolic Heart Failure. Note:  Per Cardiology - Patient needs [facility] given his new dx of cardiomyopathy in November 2018.  Awaiting diuresis before performing cath.   ?  *[facility]. Patient is an established patient in the [facility].  Next appointment was scheduled for 11/17/2017.  Appointment has now been changed to 11/20/2017 at 1:40 p.m.    Again, reviewed the 5 Steps to Living Better with Heart Failure.  Patient thanked me for providing the above information. ?  Army Melia, RN, BSN, [facility] Cardiovascular and Pulmonary Nurse Navigator

## 2017-11-16 NOTE — Progress Notes (Signed)
[employer] [facility] [place] [facility]   PATIENT NAME: Marcus Foster    MR#:  [id number]  DATE OF BIRTH:  10-24-1960  SUBJECTIVE:  CHIEF COMPLAINT:   Chief Complaint  Patient presents with  . Shortness of Breath  . Congestive Heart Failure     Came with weight gain, edema, congestive heart failure.  Feels slightly better today. Still have significant edema.   REVIEW OF SYSTEMS:  CONSTITUTIONAL: No fever, fatigue or weakness.  EYES: No blurred or double vision.  EARS, NOSE, AND THROAT: No tinnitus or ear pain.  RESPIRATORY: No cough, shortness of breath, wheezing or hemoptysis.  CARDIOVASCULAR: No chest pain, orthopnea, edema.  GASTROINTESTINAL: No nausea, vomiting, diarrhea or abdominal pain.  GENITOURINARY: No dysuria, hematuria.  ENDOCRINE: No polyuria, nocturia,  HEMATOLOGY: No anemia, easy bruising or bleeding SKIN: No rash or lesion. MUSCULOSKELETAL: No joint pain or arthritis.   NEUROLOGIC: No tingling, numbness, weakness.  PSYCHIATRY: No anxiety or depression.   ROS  DRUG ALLERGIES:  No Known Allergies  VITALS:  Blood pressure (!) 127/93, pulse 86, temperature (!) 97.4 F (36.3 C), temperature source Oral, resp. rate 18, height 5\' 10"  (1.778 m), weight 97.3 kg (214 lb 8 oz), SpO2 97 %.  PHYSICAL EXAMINATION:   GENERAL:  57 y.o.-year-old patient lying in the bed with no acute distress.  EYES: Pupils equal, round, reactive to light and accommodation. No scleral icterus. Extraocular muscles intact.  HEENT: Head atraumatic, normocephalic. Oropharynx and nasopharynx clear.  NECK:  Supple, have jugular venous distention. No thyroid enlargement, no tenderness.  LUNGS: Normal breath sounds bilaterally, no wheezing, some crepitation. No use of accessory muscles of respiration.  CARDIOVASCULAR: S1, S2 normal. No murmurs, rubs, or gallops.  ABDOMEN: Soft, nontender, nondistended. Bowel sounds present. No organomegaly or mass.  EXTREMITIES: Bilateral excessive  pedal edema, extending up to his thighs, scrotum and lower abdominal wall, no cyanosis, or clubbing.  NEUROLOGIC: Cranial nerves II through XII are intact. Muscle strength 5/5 in all extremities. Sensation intact. Gait not checked.  PSYCHIATRIC: The patient is alert and oriented x 3.  SKIN: No obvious rash, lesion, or ulcer.    Physical Exam LABORATORY PANEL:   CBC Recent Labs  Lab 11/14/17 0602  WBC 3.3*  HGB 14.1  HCT 44.3  PLT 115*   ------------------------------------------------------------------------------------------------------------------  Chemistries  Recent Labs  Lab 11/15/17 0546 11/16/17 0315  NA 134* 130*  K 4.8 4.2  CL 96* 95*  CO2 20* 23  GLUCOSE 344* 495*  BUN 41* 51*  CREATININE 1.59* 1.62*  CALCIUM 8.8* 8.5*  MG 1.7  --   AST 32  --   ALT 16*  --   ALKPHOS 92  --   BILITOT 1.7*  --    ------------------------------------------------------------------------------------------------------------------  Cardiac Enzymes Recent Labs  Lab 11/13/17 1439 11/13/17 2024  TROPONINI 0.03* <0.03   ------------------------------------------------------------------------------------------------------------------  RADIOLOGY:  No results found.  ASSESSMENT AND PLAN:   Principal Problem:   Acute on chronic systolic CHF (congestive heart failure) (HCC) Active Problems:   HTN (hypertension)   Alcohol abuse   Smoker   Cocaine abuse (HCC)   Nonischemic cardiomyopathy (HCC)   * Generalized edema,   Acute on chronic systolic congestive heart failure    IV Lasix- increased ( 80 mg BID) dose, restriction on fluid, intake and output measurement. 3200 ml negative fluid balance so far 1900 in last 24 hrs.   Monitor on telemetry, serial troponin, cardiology consult appreciated.   Recent echocardiogram showed EF  of 25%.   Continue home medications like Coreg, atorvastatin.   As per pharmacy record, patient is not taking enteresto- so I will leave it up to  cardiologist to start that.   Cardiology also suggesting to do check for ischemia workup, plan per them.  Continue diuretics, decrease dose of Lasix because of worsening renal function. May need to start spironolactone before discharge.  * Hypertension   Continue carvedilol.  * CKD stage III   Monitor with IV Lasix. Slight worsening. Decreased Lasix dose.  * Alcohol abuse   Continue vitamin supplementation, we may help to keep him on CIWA if any withdrawal symptoms, currently none.   Vitamin supplementation.  * Smoking   Counseled to quit smoking for 4 minutes and offered nicotine patch.  * elevated LFTs   Patient is alcoholic, monitor tomorrow.  * Hypomagnesemia, due to alcoholism   Replace IV, check tomorrow.  * Hypothyroidism   Start on low-dose levothyroxine.  * Hyperglycemia   Likely secondary to IV steroids   Stop the steroid as there is no wheezing, keep on sliding scale coverage.   Hemoglobin A1c 5.5  All the records are reviewed and case discussed with Care Management/Social Workerr. Management plans discussed with the patient, family and they are in agreement.  CODE STATUS: Full.  TOTAL TIME TAKING CARE OF THIS PATIENT: 35 minutes.    POSSIBLE D/C IN 1-2 DAYS, DEPENDING ON CLINICAL CONDITION.   Altamese Dilling M.D on 11/16/2017   Between 7am to 6pm - Pager - [phone]  After 6pm go to [email or web address] - password [email or web address]  [facility] [place] [facility]  Office  [phone]  CC: Primary care physician; Armando Gang, FNP  Note: This dictation was prepared with Dragon dictation along with smaller phrase technology. Any transcriptional errors that result from this process are unintentional.

## 2017-11-16 NOTE — Discharge Instructions (Signed)
Heart Failure Clinic appointment on November 20 2017 at 1:40pm with Clarisa Kindred, FNP. Please call [phone] to reschedule.

## 2017-11-16 NOTE — Progress Notes (Signed)
Pts CBG is 515. MD notified. Orders for 10 units lantus times one dose and 12 units novolog one time dose. I will continue to assess.

## 2017-11-16 NOTE — Care Management (Signed)
Patient firm in his statement that he is able to obtain his medications and he still has pharmacy coverage.  He still says he has access to scales and verbalizes understanding to weigh daily but admits to not doing so.  Says he has an appointment at the Heart Failure Clinic on Friday.  Attending anticipates discharge within the next 24-48 hours.  Continues to need diuresis

## 2017-11-17 ENCOUNTER — Ambulatory Visit: Payer: BLUE CROSS/BLUE SHIELD | Admitting: Family

## 2017-11-17 DIAGNOSIS — N183 Chronic kidney disease, stage 3 (moderate): Secondary | ICD-10-CM

## 2017-11-17 DIAGNOSIS — N179 Acute kidney failure, unspecified: Secondary | ICD-10-CM

## 2017-11-17 LAB — GLUCOSE, CAPILLARY
Glucose-Capillary: 515 mg/dL (ref 65–99)
Glucose-Capillary: 154 mg/dL — ABNORMAL HIGH (ref 65–99)
Glucose-Capillary: 230 mg/dL — ABNORMAL HIGH (ref 65–99)
Glucose-Capillary: 223 mg/dL — ABNORMAL HIGH (ref 65–99)
Glucose-Capillary: 183 mg/dL — ABNORMAL HIGH (ref 65–99)

## 2017-11-17 LAB — COMPREHENSIVE METABOLIC PANEL
ALBUMIN: 3.7 g/dL (ref 3.5–5.0)
ALK PHOS: 137 U/L — AB (ref 38–126)
ALT: 21 U/L (ref 17–63)
AST: 37 U/L (ref 15–41)
Anion gap: 11 (ref 5–15)
BILIRUBIN TOTAL: 1.1 mg/dL (ref 0.3–1.2)
BUN: 62 mg/dL — AB (ref 6–20)
CALCIUM: 8.7 mg/dL — AB (ref 8.9–10.3)
CO2: 28 mmol/L (ref 22–32)
CREATININE: 1.36 mg/dL — AB (ref 0.61–1.24)
Chloride: 96 mmol/L — ABNORMAL LOW (ref 101–111)
GFR calc Af Amer: >60 mL/min (ref >60)
GFR calc non Af Amer: 55 mL/min — ABNORMAL LOW (ref >60)
GLUCOSE: 182 mg/dL — AB (ref 65–99)
POTASSIUM: 3.5 mmol/L (ref 3.5–5.1)
Sodium: 135 mmol/L (ref 135–145)
TOTAL PROTEIN: 7.3 g/dL (ref 6.5–8.1)

## 2017-11-17 LAB — MAGNESIUM: Magnesium: 1.5 mg/dL — ABNORMAL LOW (ref 1.7–2.4)

## 2017-11-17 MED ORDER — ASPIRIN EC 81 MG PO TBEC
81.0000 mg | DELAYED_RELEASE_TABLET | Freq: Every day | ORAL | Status: DC
  Administered 2017-11-17 – 2017-11-21 (×4): 81 mg via ORAL
  Filled 2017-11-17 (×4): qty 1

## 2017-11-17 MED ORDER — FUROSEMIDE 10 MG/ML IJ SOLN
40.0000 mg | Freq: Every day | INTRAMUSCULAR | Status: DC
  Administered 2017-11-17: 40 mg via INTRAVENOUS
  Filled 2017-11-17: qty 4

## 2017-11-17 MED ORDER — MAGNESIUM SULFATE 2 GM/50ML IV SOLN
2.0000 g | Freq: Once | INTRAVENOUS | Status: AC
  Administered 2017-11-17: 2 g via INTRAVENOUS
  Filled 2017-11-17: qty 50

## 2017-11-17 MED ORDER — POTASSIUM CHLORIDE CRYS ER 20 MEQ PO TBCR
20.0000 meq | EXTENDED_RELEASE_TABLET | Freq: Every day | ORAL | Status: DC
  Administered 2017-11-17 – 2017-11-18 (×2): 20 meq via ORAL
  Filled 2017-11-17 (×2): qty 1

## 2017-11-17 NOTE — Plan of Care (Signed)
  Progressing Education: Knowledge of General Education information will improve 11/17/2017 2345 - Progressing by Myles Gip, RN 11/17/2017 2343 - Progressing by Myles Gip, RN Clinical Measurements: Ability to maintain clinical measurements within normal limits will improve 11/17/2017 2345 - Progressing by Myles Gip, RN 11/17/2017 2343 - Progressing by Myles Gip, RN Respiratory complications will improve 11/17/2017 2345 - Progressing by Myles Gip, RN 11/17/2017 2343 - Progressing by Myles Gip, RN Activity: Risk for activity intolerance will decrease 11/17/2017 2345 - Progressing by Myles Gip, RN 11/17/2017 2343 - Progressing by Myles Gip, RN Safety: Ability to remain free from injury will improve 11/17/2017 2345 - Progressing by Myles Gip, RN 11/17/2017 2343 - Progressing by Myles Gip, RN Skin Integrity: Risk for impaired skin integrity will decrease 11/17/2017 2345 - Progressing by Myles Gip, RN 11/17/2017 2343 - Progressing by Myles Gip, RN

## 2017-11-17 NOTE — Plan of Care (Signed)
  Progressing Education: Knowledge of General Education information will improve 11/17/2017 2343 - Progressing by Myles Gip, RN Clinical Measurements: Ability to maintain clinical measurements within normal limits will improve 11/17/2017 2343 - Progressing by Myles Gip, RN Respiratory complications will improve 11/17/2017 2343 - Progressing by Myles Gip, RN Activity: Risk for activity intolerance will decrease 11/17/2017 2343 - Progressing by Myles Gip, RN Safety: Ability to remain free from injury will improve 11/17/2017 2343 - Progressing by Myles Gip, RN Skin Integrity: Risk for impaired skin integrity will decrease 11/17/2017 2343 - Progressing by Myles Gip, RN

## 2017-11-17 NOTE — Plan of Care (Signed)
  Progressing Education: Knowledge of General Education information will improve 11/17/2017 0111 - Progressing by Kalman Jewels, RN Health Behavior/Discharge Planning: Ability to manage health-related needs will improve 11/17/2017 0111 - Progressing by Kalman Jewels, RN Clinical Measurements: Ability to maintain clinical measurements within normal limits will improve 11/17/2017 0111 - Progressing by Kalman Jewels, RN Will remain free from infection 11/17/2017 0111 - Progressing by Kalman Jewels, RN Diagnostic test results will improve 11/17/2017 0111 - Progressing by Kalman Jewels, RN Respiratory complications will improve 11/17/2017 0111 - Progressing by Kalman Jewels, RN Cardiovascular complication will be avoided 11/17/2017 0111 - Progressing by Kalman Jewels, RN Activity: Risk for activity intolerance will decrease 11/17/2017 0111 - Progressing by Kalman Jewels, RN Elimination: Will not experience complications related to urinary retention 11/17/2017 0111 - Progressing by Kalman Jewels, RN Safety: Ability to remain free from injury will improve 11/17/2017 0111 - Progressing by Kalman Jewels, RN Skin Integrity: Risk for impaired skin integrity will decrease 11/17/2017 0111 - Progressing by Kalman Jewels, RN Education: Ability to demonstrate management of disease process will improve 11/17/2017 0111 - Progressing by Kalman Jewels, RN Ability to verbalize understanding of medication therapies will improve 11/17/2017 0111 - Progressing by Kalman Jewels, RN Activity: Capacity to carry out activities will improve 11/17/2017 0111 - Progressing by Kalman Jewels, RN

## 2017-11-17 NOTE — Progress Notes (Signed)
[employer] [facility] [place] [facility]   PATIENT NAME: Marcus Foster    MR#:  [id number]  DATE OF BIRTH:  May 14, 1961  SUBJECTIVE:  CHIEF COMPLAINT:   Chief Complaint  Patient presents with  . Shortness of Breath  . Congestive Heart Failure     Came with weight gain, edema, congestive heart failure.  Feels  better today. Still have some edema.   REVIEW OF SYSTEMS:  CONSTITUTIONAL: No fever, fatigue or weakness.  EYES: No blurred or double vision.  EARS, NOSE, AND THROAT: No tinnitus or ear pain.  RESPIRATORY: No cough, shortness of breath, wheezing or hemoptysis.  CARDIOVASCULAR: No chest pain, orthopnea, edema.  GASTROINTESTINAL: No nausea, vomiting, diarrhea or abdominal pain.  GENITOURINARY: No dysuria, hematuria.  ENDOCRINE: No polyuria, nocturia,  HEMATOLOGY: No anemia, easy bruising or bleeding SKIN: No rash or lesion. MUSCULOSKELETAL: No joint pain or arthritis.   NEUROLOGIC: No tingling, numbness, weakness.  PSYCHIATRY: No anxiety or depression.   ROS  DRUG ALLERGIES:  No Known Allergies  VITALS:  Blood pressure 118/81, pulse 83, temperature 98.4 F (36.9 C), temperature source Oral, resp. rate 18, height 5\' 10"  (1.778 m), weight 95.9 kg (211 lb 8 oz), SpO2 96 %.  PHYSICAL EXAMINATION:   GENERAL:  57 y.o.-year-old patient lying in the bed with no acute distress.  EYES: Pupils equal, round, reactive to light and accommodation. No scleral icterus. Extraocular muscles intact.  HEENT: Head atraumatic, normocephalic. Oropharynx and nasopharynx clear.  NECK:  Supple, have jugular venous distention. No thyroid enlargement, no tenderness.  LUNGS: Normal breath sounds bilaterally, no wheezing, some crepitation. No use of accessory muscles of respiration.  CARDIOVASCULAR: S1, S2 normal. No murmurs, rubs, or gallops.  ABDOMEN: Soft, nontender, nondistended. Bowel sounds present. No organomegaly or mass.  EXTREMITIES: Bilateral pedal edema, extending up to his  thighs, scrotum and lower abdominal wall, better now, no cyanosis, or clubbing.  NEUROLOGIC: Cranial nerves II through XII are intact. Muscle strength 5/5 in all extremities. Sensation intact. Gait not checked.  PSYCHIATRIC: The patient is alert and oriented x 3.  SKIN: No obvious rash, lesion, or ulcer.    Physical Exam LABORATORY PANEL:   CBC Recent Labs  Lab 11/14/17 0602  WBC 3.3*  HGB 14.1  HCT 44.3  PLT 115*   ------------------------------------------------------------------------------------------------------------------  Chemistries  Recent Labs  Lab 11/17/17 0538  NA 135  K 3.5  CL 96*  CO2 28  GLUCOSE 182*  BUN 62*  CREATININE 1.36*  CALCIUM 8.7*  MG 1.5*  AST 37  ALT 21  ALKPHOS 137*  BILITOT 1.1   ------------------------------------------------------------------------------------------------------------------  Cardiac Enzymes Recent Labs  Lab 11/13/17 1439 11/13/17 2024  TROPONINI 0.03* <0.03   ------------------------------------------------------------------------------------------------------------------  RADIOLOGY:  No results found.  ASSESSMENT AND PLAN:   Principal Problem:   Acute on chronic systolic CHF (congestive heart failure) (HCC) Active Problems:   HTN (hypertension)   Alcohol abuse   Smoker   Cocaine abuse (HCC)   Nonischemic cardiomyopathy (HCC)   * Generalized edema,   Acute on chronic systolic congestive heart failure    IV Lasix- increased ( 80 mg BID) dose, restriction on fluid, intake and output measurement. negative volume  cardiology consult appreciated.   Recent echocardiogram showed EF of 25%.   Continue home medications like Coreg, atorvastatin.   As per pharmacy record, patient is not taking enteresto- so I will leave it up to cardiologist to start that.   Cardiology also suggesting to do check for  ischemia workup, plan per them. Likely tomorrow.  Continue diuretics, decrease dose of Lasix  because of worsening renal function. May need to start spironolactone before discharge.  * Hypertension   Continue carvedilol.  * CKD stage III   Monitor with IV Lasix. Slight worsening. Decreased Lasix dose.  * Alcohol abuse   Continue vitamin supplementation, we may help to keep him on CIWA if any withdrawal symptoms, currently none.   Vitamin supplementation.  * Smoking   Counseled to quit smoking for 4 minutes and offered nicotine patch.  * elevated LFTs   Patient is alcoholic, monitor tomorrow.  * Hypomagnesemia, due to alcoholism   Replace IV.  * Hypothyroidism   Start on low-dose levothyroxine.  * Hyperglycemia   Likely secondary to IV steroids   Stop the steroid as there is no wheezing, keep on sliding scale coverage.   Hemoglobin A1c 5.5  All the records are reviewed and case discussed with Care Management/Social Workerr. Management plans discussed with the patient, family and they are in agreement.  CODE STATUS: Full.  TOTAL TIME TAKING CARE OF THIS PATIENT: 35 minutes.    POSSIBLE D/C IN 1-2 DAYS, DEPENDING ON CLINICAL CONDITION.   Altamese Dilling M.D on 11/17/2017   Between 7am to 6pm - Pager - [phone]  After 6pm go to [email or web address] - password [email or web address]  [facility]  Office  [phone]  CC: Primary care physician; Armando Gang, FNP  Note: This dictation was prepared with Dragon dictation along with smaller phrase technology. Any transcriptional errors that result from this process are unintentional.

## 2017-11-17 NOTE — Progress Notes (Signed)
Progress Note  Patient Name: Marcus Foster Date of Encounter: 11/17/2017  Primary Cardiologist: Julien Nordmann, MD  Subjective   Breathing improved.  Able to lie flat without difficulty.  No chest pain.  Edema has improved but  not entirely resolved.  Inpatient Medications    Scheduled Meds: . atorvastatin  40 mg Oral Daily  . carvedilol  6.25 mg Oral BID WC  . folic acid  1 mg Oral Daily  . furosemide  40 mg Intravenous Daily  . heparin  5,000 Units Subcutaneous Q8H  . insulin aspart  0-5 Units Subcutaneous QHS  . insulin aspart  0-9 Units Subcutaneous TID WC  . levothyroxine  50 mcg Oral QAC breakfast  . magnesium oxide  400 mg Oral Daily  . multivitamin with minerals  1 tablet Oral Daily  . protein supplement shake  11 oz Oral BID BM  . sacubitril-valsartan  1 tablet Oral BID  . sodium chloride flush  3 mL Intravenous Q12H  . thiamine injection  100 mg Intravenous Daily  . [START ON 11/18/2017] thiamine  100 mg Oral Daily   Continuous Infusions:  PRN Meds: docusate sodium, ipratropium-albuterol, LORazepam, sodium chloride flush   Vital Signs    Vitals:   11/16/17 1922 11/17/17 0331 11/17/17 0447 11/17/17 0730  BP: (!) 143/110 (!) 134/96  (!) 128/93  Pulse: 92 79  71  Resp: 18 17    Temp: 98.2 F (36.8 C) (!) 97.4 F (36.3 C)  97.7 F (36.5 C)  TempSrc: Oral Oral  Oral  SpO2: 100% 98%  100%  Weight:   211 lb 8 oz (95.9 kg)   Height:        Intake/Output Summary (Last 24 hours) at 11/17/2017 1050 Last data filed at 11/17/2017 1026 Gross per 24 hour  Intake 720 ml  Output 4055 ml  Net -3335 ml   Filed Weights   11/15/17 0313 11/16/17 0500 11/17/17 0447  Weight: 213 lb 4.8 oz (96.8 kg) 214 lb 8 oz (97.3 kg) 211 lb 8 oz (95.9 kg)    Physical Exam   GEN: Well nourished, well developed, in no acute distress.  HEENT: Grossly normal.  Neck: Supple, JVP approximately 12-14 cm, no carotid bruits, or masses. Cardiac: RRR, no murmurs, rubs, or gallops. No  clubbing, cyanosis.  He has trace to 1+ ankle edema with 1-2+ edema of the bilateral posterior thighs, flanks, and abdomen.  Radials/DP/PT 1+ and equal bilaterally.  Respiratory:  Respirations regular and unlabored, clear to auscultation bilaterally. GI: Obese, firm and protuberant with abdominal wall edema. BS + x 4. MS: no deformity or atrophy. Skin: warm and dry, no rash. Neuro:  Strength and sensation are intact. Psych: AAOx3.  Normal affect.  Labs    Chemistry Recent Labs  Lab 11/13/17 2200  11/15/17 0546 11/16/17 0315 11/17/17 0538  NA  --    < > 134* 130* 135  K  --    < > 4.8 4.2 3.5  CL  --    < > 96* 95* 96*  CO2  --    < > 20* 23 28  GLUCOSE  --    < > 344* 495* 182*  BUN  --    < > 41* 51* 62*  CREATININE  --    < > 1.59* 1.62* 1.36*  CALCIUM  --    < > 8.8* 8.5* 8.7*  PROT 7.6  --  7.2  --  7.3  ALBUMIN 3.7  --  3.5  --  3.7  AST 44*  --  32  --  37  ALT 18  --  16*  --  21  ALKPHOS 98  --  92  --  137*  BILITOT 3.2*  --  1.7*  --  1.1  GFRNONAA  --    < > 46* 45* 55*  GFRAA  --    < > 53* 52* >60  ANIONGAP  --    < > 18* 12 11   < > = values in this interval not displayed.     Hematology Recent Labs  Lab 11/13/17 0755 11/14/17 0602  WBC 4.8 3.3*  RBC 4.19* 4.48  HGB 13.3 14.1  HCT 41.4 44.3  MCV 98.8 98.8  MCH 31.8 31.5  MCHC 32.2 31.8*  RDW 15.1* 15.1*  PLT 110* 115*    Cardiac Enzymes Recent Labs  Lab 11/13/17 0755 11/13/17 1439 11/13/17 2024  TROPONINI 0.03* 0.03* <0.03      BNP Recent Labs  Lab 11/13/17 0755  BNP >4,500.0*      Radiology    No results found.  Telemetry    RSR, occas pvc's, 7 beat run of nonsustained VT- Personally Reviewed  Cardiac Studies   TTE 09/07/2017: Study Conclusions  - Left ventricle: The cavity size was mildly dilated. Wall thickness was increased increased in a pattern of mild to moderate LVH. Systolic function was severely reduced. The estimated ejection fraction was in the range  of 20% to 25%. Diffuse hypokinesis. Regional wall motion abnormalities cannot be excluded. - Aortic valve: Calcified annulus. Mildly thickened leaflets. - Mitral valve: Mildly calcified annulus. Mildly thickened leaflets . There was mild to moderate regurgitation. - Left atrium: The atrium was mildly to moderately dilated. - Right ventricle: The cavity size was mildly dilated. Wall thickness was normal. Systolic function was moderately reduced. - Right atrium: The atrium was mildly dilated. - Pericardium, extracardiac: A trivial pericardial effusion was identified posterior to the heart.  Patient Profile     57 y.o. male with history of chronic systolic CHF diagnosed in 08/2017 with an EF of 20-25% by TTE without prior ischemic workup, polysubstance abuse with ongoing cocaine, alcohol, and tobacco abuse, medication noncompliance, HTN, thrombocytopenia, and obesity who was admitted 2/1 with acute on chronic systolic CHF in the setting of noncompliance.   Assessment & Plan    1. Acute on chronic systolic CHF/cardiomyopathy (? Isch vs NICM):  Admitted 2/1 with progressive dyspnea, orthopnea, and edema.  EF 20-25% by echo w/ diff HK in 08/2017.  Minus 3.3L overnight and 6.7L for admission.  He feels much better but remains significantly volume overloaded with posterior thigh, abdominal, and flank edema.  Still with significant JVD.  As the appearance of significant right heart failure.  His creatinine has been stable but BUN and bicarb are bumping at 62 and 28 respectively.  We will reduce his Lasix to 40 mg IV daily.  Continue beta-blocker and Entresto.  As we are continuing to diurese and considering diagnostic catheterization, I will hold off on the addition of a mineralocorticoid receptor antagonist at this time though, we will look to add this post cath if possible.  We will make him n.p.o. after midnight and reassess his volume status and renal fxn in the morning with plan for possible  cath to determine if this is an ischemic or nonischemic cardiomyopathy. The patient understands that risks include but are not limited to stroke (1 in 1000), death (1 in 1000),  kidney failure [usually temporary] (1 in 500), bleeding (1 in 200), allergic reaction [possibly serious] (1 in 200), and agrees to proceed.    2.  Essential HTN: Blood pressure has been stable on carvedilol and Entresto.  3.  Acute on CKD III: Creatinine 1.36 this morning.  As above, BUN and bicarb have been pumping in the setting of diuresis.  Reducing Lasix to 40 mill grams IV daily.  4.  Polysubstance abuse: This includes alcohol, tobacco, and cocaine.  He was cocaine positive on February 1.  He is currently on carvedilol and will need significant ongoing patient education with regards to the potential dangers of using cocaine in the setting of beta-blocker therapy.  Complete cessation advised.  5.  Abnormal TSH: TSH 7.739.  This was 7.547 in November.  Question sick euthyroid versus true hypothyroidism.  He is now on levothyroxine.  Defer to internal medicine.  6.  Hypomagnesemia: Supplementation ordered and he is receiving IV mag sulfate this morning.  7. NSVT: 7 beats noted on tele.  Cont  blocker. Supp Mg/K.  Signed, Nicolasa Ducking, NP  11/17/2017, 10:50 AM    For questions or updates, please contact   Please consult [email or web address] for contact info under Cardiology/STEMI.

## 2017-11-18 LAB — GLUCOSE, CAPILLARY
GLUCOSE-CAPILLARY: 218 mg/dL — AB (ref 65–99)
GLUCOSE-CAPILLARY: 153 mg/dL — AB (ref 65–99)
Glucose-Capillary: 138 mg/dL — ABNORMAL HIGH (ref 65–99)
Glucose-Capillary: 272 mg/dL — ABNORMAL HIGH (ref 65–99)

## 2017-11-18 LAB — BASIC METABOLIC PANEL
ANION GAP: 12 (ref 5–15)
BUN: 55 mg/dL — ABNORMAL HIGH (ref 6–20)
CO2: 30 mmol/L (ref 22–32)
Calcium: 9.1 mg/dL (ref 8.9–10.3)
Chloride: 96 mmol/L — ABNORMAL LOW (ref 101–111)
Creatinine, Ser: 1.44 mg/dL — ABNORMAL HIGH (ref 0.61–1.24)
GFR, EST AFRICAN AMERICAN: 60 mL/min — AB (ref >60)
GFR, EST NON AFRICAN AMERICAN: 52 mL/min — AB (ref >60)
GLUCOSE: 157 mg/dL — AB (ref 65–99)
POTASSIUM: 3.5 mmol/L (ref 3.5–5.1)
Sodium: 138 mmol/L (ref 135–145)

## 2017-11-18 MED ORDER — POTASSIUM CHLORIDE CRYS ER 20 MEQ PO TBCR
20.0000 meq | EXTENDED_RELEASE_TABLET | Freq: Two times a day (BID) | ORAL | Status: DC
  Administered 2017-11-18 – 2017-11-21 (×6): 20 meq via ORAL
  Filled 2017-11-18 (×6): qty 1

## 2017-11-18 MED ORDER — SODIUM CHLORIDE 0.9 % IV SOLN: 250.0000 mL | INTRAVENOUS | Status: DC | PRN

## 2017-11-18 MED ORDER — ASPIRIN 81 MG PO CHEW
81.0000 mg | CHEWABLE_TABLET | ORAL | Status: AC
  Administered 2017-11-19: 81 mg via ORAL
  Filled 2017-11-18: qty 1

## 2017-11-18 MED ORDER — SODIUM CHLORIDE 0.9% FLUSH: 3.0000 mL | INTRAVENOUS | Status: DC | PRN

## 2017-11-18 MED ORDER — SODIUM CHLORIDE 0.9 % IV SOLN
INTRAVENOUS | Status: DC
  Administered 2017-11-19: 09:00:00 via INTRAVENOUS

## 2017-11-18 MED ORDER — SODIUM CHLORIDE 0.9% FLUSH
3.0000 mL | Freq: Two times a day (BID) | INTRAVENOUS | Status: DC
  Administered 2017-11-18 – 2017-11-19 (×2): 3 mL via INTRAVENOUS

## 2017-11-18 MED ORDER — GUAIFENESIN-DM 100-10 MG/5ML PO SYRP
5.0000 mL | ORAL_SOLUTION | Freq: Once | ORAL | Status: AC
  Administered 2017-11-18: 5 mL via ORAL
  Filled 2017-11-18: qty 5

## 2017-11-18 NOTE — Progress Notes (Signed)
Pt refusing bed alarm but was educated on safety. Will continue to monitor

## 2017-11-18 NOTE — Progress Notes (Signed)
Progress Note  Patient Name: Marcus Foster Date of Encounter: 11/18/2017  Primary Cardiologist: Julien Nordmann, MD  Subjective   Breathing improved overall.  Still with posterior leg, flank, abd edema. Creat up slightly this AM.  Inpatient Medications    Scheduled Meds: . [START ON 11/19/2017] aspirin  81 mg Oral Pre-Cath  . aspirin EC  81 mg Oral Daily  . atorvastatin  40 mg Oral Daily  . carvedilol  6.25 mg Oral BID WC  . folic acid  1 mg Oral Daily  . heparin  5,000 Units Subcutaneous Q8H  . insulin aspart  0-5 Units Subcutaneous QHS  . insulin aspart  0-9 Units Subcutaneous TID WC  . levothyroxine  50 mcg Oral QAC breakfast  . magnesium oxide  400 mg Oral Daily  . multivitamin with minerals  1 tablet Oral Daily  . potassium chloride  20 mEq Oral Daily  . protein supplement shake  11 oz Oral BID BM  . sacubitril-valsartan  1 tablet Oral BID  . sodium chloride flush  3 mL Intravenous Q12H  . sodium chloride flush  3 mL Intravenous Q12H  . thiamine  100 mg Oral Daily   Continuous Infusions: . sodium chloride    . [START ON 11/19/2017] sodium chloride     PRN Meds: sodium chloride, docusate sodium, ipratropium-albuterol, LORazepam, sodium chloride flush, sodium chloride flush   Vital Signs    Vitals:   11/17/17 1510 11/17/17 1953 11/18/17 0331 11/18/17 0827  BP: 104/73 118/81 (!) 129/98 127/88  Pulse: 79 83 80 82  Resp:  18 17   Temp: (!) 97.4 F (36.3 C) 98.4 F (36.9 C) (!) 97.5 F (36.4 C) 98.1 F (36.7 C)  TempSrc: Oral Oral Oral Oral  SpO2: 98% 96% 99% 100%  Weight:   204 lb 4.8 oz (92.7 kg)   Height:        Intake/Output Summary (Last 24 hours) at 11/18/2017 0959 Last data filed at 11/18/2017 0826 Gross per 24 hour  Intake 840 ml  Output 2900 ml  Net -2060 ml   Filed Weights   11/16/17 0500 11/17/17 0447 11/18/17 0331  Weight: 214 lb 8 oz (97.3 kg) 211 lb 8 oz (95.9 kg) 204 lb 4.8 oz (92.7 kg)    Physical Exam   GEN: Well nourished, well  developed, in no acute distress.  HEENT: Grossly normal.  Neck: Supple, JVP ~ 10 cm, no carotid bruits, or masses. Cardiac: RRR, no murmurs, rubs, or gallops. No cyanosis,  2+ edema of calves, thighs, abd.  Radials/DP/PT 2+ and equal bilaterally.  Respiratory:  Respirations regular and unlabored, clear to auscultation bilaterally. GI: protuberant, edematous, nontender, BS + x 4. MS: no deformity or atrophy. Skin: warm and dry, no rash. Neuro:  Strength and sensation are intact. Psych: AAOx3.  Normal affect.  Labs    Chemistry Recent Labs  Lab 11/13/17 2200  11/15/17 0546 11/16/17 0315 11/17/17 0538 11/18/17 0418  NA  --    < > 134* 130* 135 138  K  --    < > 4.8 4.2 3.5 3.5  CL  --    < > 96* 95* 96* 96*  CO2  --    < > 20* 23 28 30   GLUCOSE  --    < > 344* 495* 182* 157*  BUN  --    < > 41* 51* 62* 55*  CREATININE  --    < > 1.59* 1.62* 1.36* 1.44*  CALCIUM  --    < >  8.8* 8.5* 8.7* 9.1  PROT 7.6  --  7.2  --  7.3  --   ALBUMIN 3.7  --  3.5  --  3.7  --   AST 44*  --  32  --  37  --   ALT 18  --  16*  --  21  --   ALKPHOS 98  --  92  --  137*  --   BILITOT 3.2*  --  1.7*  --  1.1  --   GFRNONAA  --    < > 46* 45* 55* 52*  GFRAA  --    < > 53* 52* >60 60*  ANIONGAP  --    < > 18* 12 11 12    < > = values in this interval not displayed.     Hematology Recent Labs  Lab 11/13/17 0755 11/14/17 0602  WBC 4.8 3.3*  RBC 4.19* 4.48  HGB 13.3 14.1  HCT 41.4 44.3  MCV 98.8 98.8  MCH 31.8 31.5  MCHC 32.2 31.8*  RDW 15.1* 15.1*  PLT 110* 115*    Cardiac Enzymes Recent Labs  Lab 11/13/17 0755 11/13/17 1439 11/13/17 2024  TROPONINI 0.03* 0.03* <0.03   BNP Recent Labs  Lab 11/13/17 0755  BNP >4,500.0*      Radiology    No results found.  Telemetry    Sinus rhythm, brief run of SVT ~ 10 secs - Personally Reviewed  Cardiac Studies   TTE 09/07/2017: Study Conclusions  - Left ventricle: The cavity size was mildly dilated. Wall thickness was  increased increased in a pattern of mild to moderate LVH. Systolic function was severely reduced. The estimated ejection fraction was in the range of 20% to 25%. Diffuse hypokinesis. Regional wall motion abnormalities cannot be excluded. - Aortic valve: Calcified annulus. Mildly thickened leaflets. - Mitral valve: Mildly calcified annulus. Mildly thickened leaflets . There was mild to moderate regurgitation. - Left atrium: The atrium was mildly to moderately dilated. - Right ventricle: The cavity size was mildly dilated. Wall thickness was normal. Systolic function was moderately reduced. - Right atrium: The atrium was mildly dilated. - Pericardium, extracardiac: A trivial pericardial effusion was identified posterior to the heart.  Patient Profile     57 y.o.malewith history of chronic systolic CHF diagnosed in 08/2017 with an EF of 20-25% by TTE without prior ischemic workup, polysubstance abuse with ongoing cocaine, alcohol, and tobacco abuse, medication noncompliance, HTN, thrombocytopenia, and obesitywho was admitted 2/1 with acute on chronic systolic CHF in the setting of noncompliance.  Assessment & Plan    1.  Acute on chronic syst CHF/CM (? icm vs nicm): admitted 2/1 w/ progressive DOE, orthopnea, and edema.  EF 20-25% by echo in 08/2017. Minus 2.3L overnight and 8.8L since admission.  Feeling much better.  Still with abdominal and lower ext edema but JVD improving.  Going to hold lasix today in the setting of rising BUN/Creat.  Will plan on R/L heart cath in AM.  The patient understands that risks include but are not limited to stroke (1 in 1000), death (1 in 1000), kidney failure [usually temporary] (1 in 500), bleeding (1 in 200), allergic reaction [possibly serious] (1 in 200), and agrees to proceed. .Cont  blocker and entresto.  No MRA @ this time with rising creat, but will consider post-cath.  2.  Essential HTN:  Stable.  3.  Acute on CKD III:  Creat up  slightly this AM.  Will hold lasix.  F/u  in AM.  4.  Polysubstance absue: etoh, tob, cocaine.  Cessation advised.  5.  HypoMg: 1.5 2/5. F/u this AM.  Supp if necessary.  6.  NSVT:  No recurrence.  K 3.5.  Supp. F/u Mg.  7.  SVT:  Brief run noted on tele.  Cont  blocker.  Correct lytes.  8.  DMII:  Insulin per IM.  Signed, Nicolasa Ducking, NP  11/18/2017, 9:59 AM    For questions or updates, please contact   Please consult [email or web address] for contact info under Cardiology/STEMI.

## 2017-11-18 NOTE — Progress Notes (Signed)
Pt refusing bed alarm but was educated about safety. Will continue to monitor. 

## 2017-11-18 NOTE — Progress Notes (Signed)
Pt was complaining of Pain. PRN Robitussin DM ordered and was given. Will continue to [facility].

## 2017-11-18 NOTE — Progress Notes (Signed)
[employer] - [place] at [facility]   PATIENT NAME: Marcus Foster    MR#:  [id number]  DATE OF BIRTH:  [birth year]  SUBJECTIVE:  CHIEF COMPLAINT:   Chief Complaint  Patient presents with  . Shortness of Breath  . Congestive Heart Failure     Came with weight gain, edema, congestive heart failure.  Feels  better today.   Have good diuresis far, renal function is slightly worse.   REVIEW OF SYSTEMS:  CONSTITUTIONAL: No fever, fatigue or weakness.  EYES: No blurred or double vision.  EARS, NOSE, AND THROAT: No tinnitus or ear pain.  RESPIRATORY: No cough, shortness of breath, wheezing or hemoptysis.  CARDIOVASCULAR: No chest pain, orthopnea, edema.  GASTROINTESTINAL: No nausea, vomiting, diarrhea or abdominal pain.  GENITOURINARY: No dysuria, hematuria.  ENDOCRINE: No polyuria, nocturia,  HEMATOLOGY: No anemia, easy bruising or bleeding SKIN: No rash or lesion. MUSCULOSKELETAL: No joint pain or arthritis.   NEUROLOGIC: No tingling, numbness, weakness.  PSYCHIATRY: No anxiety or depression.   ROS  DRUG ALLERGIES:  No Known Allergies  VITALS:  Blood pressure 127/88, pulse 82, temperature 98.1 F (36.7 C), temperature source Oral, resp. rate 17, height 5\' 10"  (1.778 m), weight 92.7 kg (204 lb 4.8 oz), SpO2 100 %.  PHYSICAL EXAMINATION:   GENERAL:  57 y.o.-year-old patient lying in the bed with no acute distress.  EYES: Pupils equal, round, reactive to light and accommodation. No scleral icterus. Extraocular muscles intact.  HEENT: Head atraumatic, normocephalic. Oropharynx and nasopharynx clear.  NECK:  Supple, no jugular venous distention. No thyroid enlargement, no tenderness.  LUNGS: Normal breath sounds bilaterally, no wheezing,  no  crepitation. No use of accessory muscles of respiration.  CARDIOVASCULAR: S1, S2 normal. No murmurs, rubs, or gallops.  ABDOMEN: Soft, nontender, nondistended. Bowel sounds present. No organomegaly or mass.  EXTREMITIES:  Bilateral  some pedal edema,  better now, no cyanosis, or clubbing.  NEUROLOGIC: Cranial nerves II through XII are intact. Muscle strength 5/5 in all extremities. Sensation intact. Gait not checked.  PSYCHIATRIC: The patient is alert and oriented x 3.  SKIN: No obvious rash, lesion, or ulcer.    Physical Exam LABORATORY PANEL:   CBC Recent Labs  Lab 11/14/17 0602  WBC 3.3*  HGB 14.1  HCT 44.3  PLT 115*   ------------------------------------------------------------------------------------------------------------------  Chemistries  Recent Labs  Lab 11/17/17 0538 11/18/17 0418  NA 135 138  K 3.5 3.5  CL 96* 96*  CO2 28 30  GLUCOSE 182* 157*  BUN 62* 55*  CREATININE 1.36* 1.44*  CALCIUM 8.7* 9.1  MG 1.5*  --   AST 37  --   ALT 21  --   ALKPHOS 137*  --   BILITOT 1.1  --    ------------------------------------------------------------------------------------------------------------------  Cardiac Enzymes Recent Labs  Lab 11/13/17 1439 11/13/17 2024  TROPONINI 0.03* <0.03   ------------------------------------------------------------------------------------------------------------------  RADIOLOGY:  No results found.  ASSESSMENT AND PLAN:   Principal Problem:   Acute on chronic systolic CHF (congestive heart failure) (HCC) Active Problems:   HTN (hypertension)   Alcohol abuse   Smoker   Cocaine abuse (HCC)   Nonischemic cardiomyopathy (HCC)   * Generalized edema,   Acute on chronic systolic congestive heart failure    IV Lasix- increased ( 80 mg BID) dose, restriction on fluid, intake and output measurement. negative volume so far  cardiology consult appreciated.   Recent echocardiogram showed EF of 25%.   Continue home medications like Coreg, atorvastatin.  As per pharmacy record, patient is not taking enteresto- so I will leave it up to cardiologist to start that.   Cardiology also suggesting to do check for ischemia workup, plan per them.  Likely tomorrow.   Has slight worsening in renal function, so would hold Lasix today in anticipation of cardiac catheterization tomorrow.  May need to start spironolactone before discharge.  * Hypertension   Continue carvedilol.  * CKD stage III   Monitor with IV Lasix. Slight worsening. Decreased Lasix dose.  * Alcohol abuse   Continue vitamin supplementation, we may help to keep him on CIWA if any withdrawal symptoms, currently none.   Vitamin supplementation.  * Smoking   Counseled to quit smoking for 4 minutes and offered nicotine patch.  * elevated LFTs   Patient is alcoholic, monitor ,stable.  * Hypomagnesemia, due to alcoholism   Replace IV.  * Hypothyroidism   Start on low-dose levothyroxine.  * Hyperglycemia   Likely secondary to IV steroids   Stop the steroid as there is no wheezing, keep on sliding scale coverage.   Hemoglobin A1c 5.5  All the records are reviewed and case discussed with Care Management/Social Workerr. Management plans discussed with the patient, family and they are in agreement.  CODE STATUS: Full.  TOTAL TIME TAKING CARE OF THIS PATIENT: 35 minutes.    POSSIBLE D/C IN 1-2 DAYS, DEPENDING ON CLINICAL CONDITION.   Altamese Dilling M.D on 11/18/2017   Between 7am to 6pm - Pager - [phone]  After 6pm go to [email or web address] - password [employer]  [facility]  Office  [phone]  CC: Primary care physician; Armando Gang, FNP  Note: This dictation was prepared with Dragon dictation along with smaller phrase technology. Any transcriptional errors that result from this process are unintentional.

## 2017-11-18 NOTE — H&P (View-Only) (Signed)
Progress Note  Patient Name: RYEL FIORAVANTI Date of Encounter: 11/18/2017  Primary Cardiologist: Julien Nordmann, MD  Subjective   Breathing improved overall.  Still with posterior leg, flank, abd edema. Creat up slightly this AM.  Inpatient Medications    Scheduled Meds: . [START ON 11/19/2017] aspirin  81 mg Oral Pre-Cath  . aspirin EC  81 mg Oral Daily  . atorvastatin  40 mg Oral Daily  . carvedilol  6.25 mg Oral BID WC  . folic acid  1 mg Oral Daily  . heparin  5,000 Units Subcutaneous Q8H  . insulin aspart  0-5 Units Subcutaneous QHS  . insulin aspart  0-9 Units Subcutaneous TID WC  . levothyroxine  50 mcg Oral QAC breakfast  . magnesium oxide  400 mg Oral Daily  . multivitamin with minerals  1 tablet Oral Daily  . potassium chloride  20 mEq Oral Daily  . protein supplement shake  11 oz Oral BID BM  . sacubitril-valsartan  1 tablet Oral BID  . sodium chloride flush  3 mL Intravenous Q12H  . sodium chloride flush  3 mL Intravenous Q12H  . thiamine  100 mg Oral Daily   Continuous Infusions: . sodium chloride    . [START ON 11/19/2017] sodium chloride     PRN Meds: sodium chloride, docusate sodium, ipratropium-albuterol, LORazepam, sodium chloride flush, sodium chloride flush   Vital Signs    Vitals:   11/17/17 1510 11/17/17 1953 11/18/17 0331 11/18/17 0827  BP: 104/73 118/81 (!) 129/98 127/88  Pulse: 79 83 80 82  Resp:  18 17   Temp: (!) 97.4 F (36.3 C) 98.4 F (36.9 C) (!) 97.5 F (36.4 C) 98.1 F (36.7 C)  TempSrc: Oral Oral Oral Oral  SpO2: 98% 96% 99% 100%  Weight:   204 lb 4.8 oz (92.7 kg)   Height:        Intake/Output Summary (Last 24 hours) at 11/18/2017 0959 Last data filed at 11/18/2017 0826 Gross per 24 hour  Intake 840 ml  Output 2900 ml  Net -2060 ml   Filed Weights   11/16/17 0500 11/17/17 0447 11/18/17 0331  Weight: 214 lb 8 oz (97.3 kg) 211 lb 8 oz (95.9 kg) 204 lb 4.8 oz (92.7 kg)    Physical Exam   GEN: Well nourished, well  developed, in no acute distress.  HEENT: Grossly normal.  Neck: Supple, JVP ~ 10 cm, no carotid bruits, or masses. Cardiac: RRR, no murmurs, rubs, or gallops. No cyanosis,  2+ edema of calves, thighs, abd.  Radials/DP/PT 2+ and equal bilaterally.  Respiratory:  Respirations regular and unlabored, clear to auscultation bilaterally. GI: protuberant, edematous, nontender, BS + x 4. MS: no deformity or atrophy. Skin: warm and dry, no rash. Neuro:  Strength and sensation are intact. Psych: AAOx3.  Normal affect.  Labs    Chemistry Recent Labs  Lab 11/13/17 2200  11/15/17 0546 11/16/17 0315 11/17/17 0538 11/18/17 0418  NA  --    < > 134* 130* 135 138  K  --    < > 4.8 4.2 3.5 3.5  CL  --    < > 96* 95* 96* 96*  CO2  --    < > 20* 23 28 30   GLUCOSE  --    < > 344* 495* 182* 157*  BUN  --    < > 41* 51* 62* 55*  CREATININE  --    < > 1.59* 1.62* 1.36* 1.44*  CALCIUM  --    < >  8.8* 8.5* 8.7* 9.1  PROT 7.6  --  7.2  --  7.3  --   ALBUMIN 3.7  --  3.5  --  3.7  --   AST 44*  --  32  --  37  --   ALT 18  --  16*  --  21  --   ALKPHOS 98  --  92  --  137*  --   BILITOT 3.2*  --  1.7*  --  1.1  --   GFRNONAA  --    < > 46* 45* 55* 52*  GFRAA  --    < > 53* 52* >60 60*  ANIONGAP  --    < > 18* 12 11 12    < > = values in this interval not displayed.     Hematology Recent Labs  Lab 11/13/17 0755 11/14/17 0602  WBC 4.8 3.3*  RBC 4.19* 4.48  HGB 13.3 14.1  HCT 41.4 44.3  MCV 98.8 98.8  MCH 31.8 31.5  MCHC 32.2 31.8*  RDW 15.1* 15.1*  PLT 110* 115*    Cardiac Enzymes Recent Labs  Lab 11/13/17 0755 11/13/17 1439 11/13/17 2024  TROPONINI 0.03* 0.03* <0.03   BNP Recent Labs  Lab 11/13/17 0755  BNP >4,500.0*      Radiology    No results found.  Telemetry    Sinus rhythm, brief run of SVT ~ 10 secs - Personally Reviewed  Cardiac Studies   TTE 09/07/2017: Study Conclusions  - Left ventricle: The cavity size was mildly dilated. Wall thickness was  increased increased in a pattern of mild to moderate LVH. Systolic function was severely reduced. The estimated ejection fraction was in the range of 20% to 25%. Diffuse hypokinesis. Regional wall motion abnormalities cannot be excluded. - Aortic valve: Calcified annulus. Mildly thickened leaflets. - Mitral valve: Mildly calcified annulus. Mildly thickened leaflets . There was mild to moderate regurgitation. - Left atrium: The atrium was mildly to moderately dilated. - Right ventricle: The cavity size was mildly dilated. Wall thickness was normal. Systolic function was moderately reduced. - Right atrium: The atrium was mildly dilated. - Pericardium, extracardiac: A trivial pericardial effusion was identified posterior to the heart.  Patient Profile     57 y.o.malewith history of chronic systolic CHF diagnosed in 08/2017 with an EF of 20-25% by TTE without prior ischemic workup, polysubstance abuse with ongoing cocaine, alcohol, and tobacco abuse, medication noncompliance, HTN, thrombocytopenia, and obesitywho was admitted 2/1 with acute on chronic systolic CHF in the setting of noncompliance.  Assessment & Plan    1.  Acute on chronic syst CHF/CM (? icm vs nicm): admitted 2/1 w/ progressive DOE, orthopnea, and edema.  EF 20-25% by echo in 08/2017. Minus 2.3L overnight and 8.8L since admission.  Feeling much better.  Still with abdominal and lower ext edema but JVD improving.  Going to hold lasix today in the setting of rising BUN/Creat.  Will plan on R/L heart cath in AM.  The patient understands that risks include but are not limited to stroke (1 in 1000), death (1 in 1000), kidney failure [usually temporary] (1 in 500), bleeding (1 in 200), allergic reaction [possibly serious] (1 in 200), and agrees to proceed. .Cont  blocker and entresto.  No MRA @ this time with rising creat, but will consider post-cath.  2.  Essential HTN:  Stable.  3.  Acute on CKD III:  Creat up  slightly this AM.  Will hold lasix.  F/u  in AM.  4.  Polysubstance absue: etoh, tob, cocaine.  Cessation advised.  5.  HypoMg: 1.5 2/5. F/u this AM.  Supp if necessary.  6.  NSVT:  No recurrence.  K 3.5.  Supp. F/u Mg.  7.  SVT:  Brief run noted on tele.  Cont  blocker.  Correct lytes.  8.  DMII:  Insulin per IM.  Signed, Nicolasa Ducking, NP  11/18/2017, 9:59 AM    For questions or updates, please contact   Please consult [email or web address] for contact info under Cardiology/STEMI.

## 2017-11-19 ENCOUNTER — Ambulatory Visit: Payer: BLUE CROSS/BLUE SHIELD | Admitting: Family

## 2017-11-19 ENCOUNTER — Encounter
Admission: EM | Disposition: A | Discharge status: Home / Self Care | Payer: Self-pay | Source: Home / Self Care | Attending: Internal Medicine

## 2017-11-19 DIAGNOSIS — I2510 Atherosclerotic heart disease of native coronary artery without angina pectoris: Secondary | ICD-10-CM

## 2017-11-19 HISTORY — PX: RIGHT/LEFT HEART CATH AND CORONARY ANGIOGRAPHY: CATH118266

## 2017-11-19 LAB — BASIC METABOLIC PANEL
ANION GAP: 9 (ref 5–15)
BUN: 46 mg/dL — AB (ref 6–20)
CHLORIDE: 99 mmol/L — AB (ref 101–111)
CO2: 31 mmol/L (ref 22–32)
Calcium: 9 mg/dL (ref 8.9–10.3)
Creatinine, Ser: 1.06 mg/dL (ref 0.61–1.24)
GFR calc Af Amer: >60 mL/min (ref >60)
GFR calc non Af Amer: >60 mL/min (ref >60)
GLUCOSE: 195 mg/dL — AB (ref 65–99)
POTASSIUM: 3.7 mmol/L (ref 3.5–5.1)
Sodium: 139 mmol/L (ref 135–145)

## 2017-11-19 LAB — MAGNESIUM: Magnesium: 1.6 mg/dL — ABNORMAL LOW (ref 1.7–2.4)

## 2017-11-19 LAB — GLUCOSE, CAPILLARY
GLUCOSE-CAPILLARY: 149 mg/dL — AB (ref 65–99)
Glucose-Capillary: 213 mg/dL — ABNORMAL HIGH (ref 65–99)
Glucose-Capillary: 118 mg/dL — ABNORMAL HIGH (ref 65–99)
Glucose-Capillary: 106 mg/dL — ABNORMAL HIGH (ref 65–99)
Glucose-Capillary: 225 mg/dL — ABNORMAL HIGH (ref 65–99)

## 2017-11-19 LAB — CARDIAC CATHETERIZATION: CATHEFQUANT: 15 %

## 2017-11-19 SURGERY — RIGHT/LEFT HEART CATH AND CORONARY ANGIOGRAPHY
Anesthesia: Moderate Sedation

## 2017-11-19 MED ORDER — HEPARIN SODIUM (PORCINE) 1000 UNIT/ML IJ SOLN
INTRAMUSCULAR | Status: DC | PRN
  Administered 2017-11-19: 5000 [IU] via INTRAVENOUS

## 2017-11-19 MED ORDER — FENTANYL CITRATE (PF) 100 MCG/2ML IJ SOLN
INTRAMUSCULAR | Status: DC | PRN
  Administered 2017-11-19: 25 ug via INTRAVENOUS

## 2017-11-19 MED ORDER — SODIUM CHLORIDE 0.9% FLUSH: 3.0000 mL | INTRAVENOUS | Status: DC | PRN

## 2017-11-19 MED ORDER — VERAPAMIL HCL 2.5 MG/ML IV SOLN
INTRAVENOUS | Status: DC | PRN
  Administered 2017-11-19: 2.5 mg via INTRA_ARTERIAL

## 2017-11-19 MED ORDER — FENTANYL CITRATE (PF) 100 MCG/2ML IJ SOLN
INTRAMUSCULAR | Status: AC
  Filled 2017-11-19: qty 2

## 2017-11-19 MED ORDER — FUROSEMIDE 10 MG/ML IJ SOLN
40.0000 mg | Freq: Two times a day (BID) | INTRAMUSCULAR | Status: DC
  Administered 2017-11-19 – 2017-11-20 (×3): 40 mg via INTRAVENOUS
  Filled 2017-11-19 (×4): qty 4

## 2017-11-19 MED ORDER — VERAPAMIL HCL 2.5 MG/ML IV SOLN
INTRAVENOUS | Status: AC
  Filled 2017-11-19: qty 2

## 2017-11-19 MED ORDER — HEPARIN SODIUM (PORCINE) 1000 UNIT/ML IJ SOLN
INTRAMUSCULAR | Status: AC
  Filled 2017-11-19: qty 1

## 2017-11-19 MED ORDER — MIDAZOLAM HCL 2 MG/2ML IJ SOLN
INTRAMUSCULAR | Status: DC | PRN
  Administered 2017-11-19: 1 mg via INTRAVENOUS

## 2017-11-19 MED ORDER — HEPARIN (PORCINE) IN NACL 2-0.9 UNIT/ML-% IJ SOLN
INTRAMUSCULAR | Status: AC
  Filled 2017-11-19: qty 1000

## 2017-11-19 MED ORDER — MAGNESIUM SULFATE 2 GM/50ML IV SOLN
2.0000 g | Freq: Once | INTRAVENOUS | Status: AC
  Administered 2017-11-19: 2 g via INTRAVENOUS
  Filled 2017-11-19: qty 50

## 2017-11-19 MED ORDER — MIDAZOLAM HCL 2 MG/2ML IJ SOLN
INTRAMUSCULAR | Status: AC
  Filled 2017-11-19: qty 2

## 2017-11-19 MED ORDER — IOPAMIDOL (ISOVUE-300) INJECTION 61%
INTRAVENOUS | Status: DC | PRN
  Administered 2017-11-19: 55 mL via INTRA_ARTERIAL

## 2017-11-19 MED ORDER — SODIUM CHLORIDE 0.9 % IV SOLN: 250.0000 mL | INTRAVENOUS | Status: DC | PRN

## 2017-11-19 MED ORDER — SODIUM CHLORIDE 0.9% FLUSH
3.0000 mL | Freq: Two times a day (BID) | INTRAVENOUS | Status: DC
  Administered 2017-11-19 – 2017-11-20 (×3): 3 mL via INTRAVENOUS

## 2017-11-19 SURGICAL SUPPLY — 9 items
CATH BALLN WEDGE 5F 110CM (CATHETERS) ×3 IMPLANT
CATH OPTITORQUE JACKY 4.0 5F (CATHETERS) ×3 IMPLANT
DEVICE RAD TR BAND REGULAR (VASCULAR PRODUCTS) ×3 IMPLANT
GLIDESHEATH SLEND SS 6F .021 (SHEATH) ×3 IMPLANT
KIT MANI 3VAL PERCEP (MISCELLANEOUS) ×3 IMPLANT
KIT RIGHT HEART (MISCELLANEOUS) ×3 IMPLANT
PACK CARDIAC CATH (CUSTOM PROCEDURE TRAY) ×3 IMPLANT
SHEATH GLIDE SLENDER 4/5FR (SHEATH) ×3 IMPLANT
WIRE ROSEN-J .035X260CM (WIRE) ×3 IMPLANT

## 2017-11-19 NOTE — Interval H&P Note (Signed)
History and Physical Interval Note:  11/19/2017 9:53 AM  Marcus Foster  has presented today for surgery, with the diagnosis of ischemic cardiomyopathy/hfref  The various methods of treatment have been discussed with the patient and family. After consideration of risks, benefits and other options for treatment, the patient has consented to  Procedure(s): RIGHT HEART CATH AND CORONARY/GRAFT ANGIOGRAPHY (N/A) as a surgical intervention .  The patient's history has been reviewed, patient examined, no change in status, stable for surgery.  I have reviewed the patient's chart and labs.  Questions were answered to the patient's satisfaction.     Lorine Bears

## 2017-11-19 NOTE — Progress Notes (Deleted)
Pt complains of having SOB. Applied 2 liters of oxygen and pt states he feeling a little bit better now. Also complains of back of the shoulder blade 1 out of 10. Also notify the prime about an episode of bleeding ulcer two years ago. Page prime. Will continue to monitor.

## 2017-11-19 NOTE — Progress Notes (Signed)
Pt refused bed alarm but was educated about safety.. Will continue to monitor. 

## 2017-11-19 NOTE — Progress Notes (Signed)
Inpatient Diabetes Program Recommendations  AACE/ADA: New Consensus Statement on Inpatient Glycemic Control (2015)  Target Ranges:  Prepandial:   less than 140 mg/dL      Peak postprandial:   less than 180 mg/dL (1-2 hours)      Critically ill patients:  140 - 180 mg/dL   Lab Results  Component Value Date   GLUCAP 149 (H) 11/19/2017   HGBA1C 5.5 11/13/2017    Review of Glycemic Control  Results for KARCH, WALDREN (MRN [id number]) as of 11/19/2017 11:01  Ref. Range 11/18/2017 11:48 11/18/2017 17:08 11/18/2017 21:14 11/19/2017 07:24 11/19/2017 08:54  Glucose-Capillary Latest Ref Range: 65 - 99 mg/dL 335 (H) 456 (H) 256 (H) 213 (H) 149 (H)    Diabetes history: None Current orders for Inpatient glycemic control:Novolog 0-9 units tid, Novolog 0-5 units qhs  Inpatient Diabetes Program Recommendations:  Agree with current medications for blood sugar management.    Susette Racer, RN, BA, MHA, CDE Diabetes Coordinator Inpatient Diabetes Program  [phone] (Team Pager) [phone] [facility] Office) 11/19/2017 11:03 AM

## 2017-11-19 NOTE — Plan of Care (Signed)
  Progressing Clinical Measurements: Respiratory complications will improve 11/19/2017 0158 - Progressing by Myles Gip, RN Safety: Ability to remain free from injury will improve 11/19/2017 0158 - Progressing by Myles Gip, RN Skin Integrity: Risk for impaired skin integrity will decrease 11/19/2017 0158 - Progressing by Myles Gip, RN

## 2017-11-19 NOTE — Progress Notes (Signed)
Pt blood sugar 283 and administer 3 units of insulin as per tech Marchelle Folks). Glucometer is not sinking yet

## 2017-11-19 NOTE — Progress Notes (Signed)
TR band removed . 2x2 and sterile tegaderm to site. No complications noted to site.

## 2017-11-19 NOTE — Progress Notes (Signed)
Dr. Kirke Corin in at bedside to speak with pt. Pt. verbalises understanding.

## 2017-11-20 ENCOUNTER — Encounter: Payer: Self-pay | Admitting: Cardiovascular Disease

## 2017-11-20 ENCOUNTER — Ambulatory Visit: Payer: BLUE CROSS/BLUE SHIELD | Admitting: Family

## 2017-11-20 LAB — BASIC METABOLIC PANEL
Anion gap: 8 (ref 5–15)
BUN: 38 mg/dL — ABNORMAL HIGH (ref 6–20)
CALCIUM: 9.1 mg/dL (ref 8.9–10.3)
CHLORIDE: 98 mmol/L — AB (ref 101–111)
CO2: 34 mmol/L — AB (ref 22–32)
Creatinine, Ser: 1.04 mg/dL (ref 0.61–1.24)
GFR calc non Af Amer: >60 mL/min (ref >60)
Glucose, Bld: 132 mg/dL — ABNORMAL HIGH (ref 65–99)
Potassium: 3.7 mmol/L (ref 3.5–5.1)
Sodium: 140 mmol/L (ref 135–145)

## 2017-11-20 LAB — CBC
HCT: 40.2 % (ref 40.0–52.0)
HEMOGLOBIN: 13 g/dL (ref 13.0–18.0)
MCH: 31.7 pg (ref 26.0–34.0)
MCHC: 32.4 g/dL (ref 32.0–36.0)
MCV: 97.7 fL (ref 80.0–100.0)
Platelets: 121 10*3/uL — ABNORMAL LOW (ref 150–440)
RBC: 4.12 MIL/uL — ABNORMAL LOW (ref 4.40–5.90)
RDW: 14.9 % — ABNORMAL HIGH (ref 11.5–14.5)
WBC: 4.4 10*3/uL (ref 3.8–10.6)

## 2017-11-20 LAB — GLUCOSE, CAPILLARY
GLUCOSE-CAPILLARY: 135 mg/dL — AB (ref 65–99)
GLUCOSE-CAPILLARY: 226 mg/dL — AB (ref 65–99)
GLUCOSE-CAPILLARY: 151 mg/dL — AB (ref 65–99)
Glucose-Capillary: 283 mg/dL — ABNORMAL HIGH (ref 65–99)
Glucose-Capillary: 260 mg/dL — ABNORMAL HIGH (ref 65–99)

## 2017-11-20 LAB — MAGNESIUM: MAGNESIUM: 1.8 mg/dL (ref 1.7–2.4)

## 2017-11-20 NOTE — Progress Notes (Signed)
[employer] - [place] at [facility]   PATIENT NAME: Marcus Foster    MR#:  [id number]  DATE OF BIRTH:  1960-10-15  SUBJECTIVE:  CHIEF COMPLAINT:   Chief Complaint  Patient presents with  . Shortness of Breath  . Congestive Heart Failure     Came with weight gain, edema, congestive heart failure.  Feels  better today.   Have good diuresis far, renal Improved, cath done, showed Some CAD- no need for stent.   Cardio suggest to continue IV diuresis for 1-2 more days.   REVIEW OF SYSTEMS:  CONSTITUTIONAL: No fever, fatigue or weakness.  EYES: No blurred or double vision.  EARS, NOSE, AND THROAT: No tinnitus or ear pain.  RESPIRATORY: No cough, shortness of breath, wheezing or hemoptysis.  CARDIOVASCULAR: No chest pain, orthopnea, edema.  GASTROINTESTINAL: No nausea, vomiting, diarrhea or abdominal pain.  GENITOURINARY: No dysuria, hematuria.  ENDOCRINE: No polyuria, nocturia,  HEMATOLOGY: No anemia, easy bruising or bleeding SKIN: No rash or lesion. MUSCULOSKELETAL: No joint pain or arthritis.   NEUROLOGIC: No tingling, numbness, weakness.  PSYCHIATRY: No anxiety or depression.   ROS  DRUG ALLERGIES:  No Known Allergies  VITALS:  Blood pressure 119/79, pulse 78, temperature 98.5 F (36.9 C), temperature source Oral, resp. rate 18, height 5\' 10"  (1.778 m), weight 88.2 kg (194 lb 8 oz), SpO2 93 %.  PHYSICAL EXAMINATION:   GENERAL:  57 y.o.-year-old patient lying in the bed with no acute distress.  EYES: Pupils equal, round, reactive to light and accommodation. No scleral icterus. Extraocular muscles intact.  HEENT: Head atraumatic, normocephalic. Oropharynx and nasopharynx clear.  NECK:  Supple, no jugular venous distention. No thyroid enlargement, no tenderness.  LUNGS: Normal breath sounds bilaterally, no wheezing,  no  crepitation. No use of accessory muscles of respiration.  CARDIOVASCULAR: S1, S2 normal. No murmurs, rubs, or gallops.  ABDOMEN: Soft,  nontender, nondistended. Bowel sounds present. No organomegaly or mass.  EXTREMITIES: Bilateral  some pedal edema,  better now, no cyanosis, or clubbing.  NEUROLOGIC: Cranial nerves II through XII are intact. Muscle strength 5/5 in all extremities. Sensation intact. Gait not checked.  PSYCHIATRIC: The patient is alert and oriented x 3.  SKIN: No obvious rash, lesion, or ulcer.    Physical Exam LABORATORY PANEL:   CBC Recent Labs  Lab 11/14/17 0602  WBC 3.3*  HGB 14.1  HCT 44.3  PLT 115*   ------------------------------------------------------------------------------------------------------------------  Chemistries  Recent Labs  Lab 11/17/17 0538  11/20/17 0433  NA 135   < > 140  K 3.5   < > 3.7  CL 96*   < > 98*  CO2 28   < > 34*  GLUCOSE 182*   < > 132*  BUN 62*   < > 38*  CREATININE 1.36*   < > 1.04  CALCIUM 8.7*   < > 9.1  MG 1.5*   < > 1.8  AST 37  --   --   ALT 21  --   --   ALKPHOS 137*  --   --   BILITOT 1.1  --   --    < > = values in this interval not displayed.   ------------------------------------------------------------------------------------------------------------------  Cardiac Enzymes Recent Labs  Lab 11/13/17 1439 11/13/17 2024  TROPONINI 0.03* <0.03   ------------------------------------------------------------------------------------------------------------------  RADIOLOGY:  No results found.  ASSESSMENT AND PLAN:   Principal Problem:   Acute on chronic systolic CHF (congestive heart failure) (HCC) Active Problems:   HTN (hypertension)  Alcohol abuse   Smoker   Cocaine abuse (HCC)   Nonischemic cardiomyopathy (HCC)   * Generalized edema,   Acute on chronic systolic congestive heart failure    IV Lasix- increased ( 80 mg BID) dose, restriction on fluid, intake and output measurement. negative volume so far  cardiology consult appreciated.   Recent echocardiogram showed EF of 25%.   Continue home medications like  Coreg, atorvastatin.   As per pharmacy record, patient is not taking enteresto- so I will leave it up to cardiologist to start that.    Has slight worsening in renal function, so would hold Lasix for cath  May need to start spironolactone before discharge.   Cath showed CAD, but not enough for stent.   Increased wedge pressure, so resume IV lasix now.  * Hypertension   Continue carvedilol.  * CKD stage III   Monitor with IV Lasix.   * Alcohol abuse   Continue vitamin supplementation, we may help to keep him on CIWA if any withdrawal symptoms, currently none.   Vitamin supplementation.  * Smoking   Counseled to quit smoking for 4 minutes and offered nicotine patch.  * elevated LFTs   Patient is alcoholic, monitor ,stable.  * Hypomagnesemia, due to alcoholism   Replace IV.  * Hypothyroidism   Start on low-dose levothyroxine.  * Hyperglycemia   Likely secondary to IV steroids   Stop the steroid as there is no wheezing, keep on sliding scale coverage.   Hemoglobin A1c 5.5  All the records are reviewed and case discussed with Care Management/Social Workerr. Management plans discussed with the patient, family and they are in agreement.  CODE STATUS: Full.  TOTAL TIME TAKING CARE OF THIS PATIENT: 35 minutes.    POSSIBLE D/C IN 1-2 DAYS, DEPENDING ON CLINICAL CONDITION.   Altamese Dilling M.D on 11/20/2017   Between 7am to 6pm - Pager - [phone]  After 6pm go to [email or web address] - password [employer]  [facility] [place] [facility]  Office  [phone]  CC: Primary care physician; Armando Gang, FNP  Note: This dictation was prepared with Dragon dictation along with smaller phrase technology. Any transcriptional errors that result from this process are unintentional.

## 2017-11-20 NOTE — Progress Notes (Signed)
Progress Note  Patient Name: Marcus Foster Date of Encounter: 11/20/2017  Primary Cardiologist: Mariah Milling  Subjective   SOB improved. No chest pain. Underwent [facility] 11/19/17 that demonstrated mild to moderate nonobstructive CAD as detailed below with severely elevated filling pressures, moderate pulmonary hypertension and mildly reduced cardiac output. Remains on IV Lasix 40 mg bid with a documented UOP of 3.8 L for the past 24 hours and a net negative UOP of 12.8 L for the admission. Weight down 6 pounds today compared to 2/7 (200-->194 pounds). Renal function remains stable, CO2 increasing.   Inpatient Medications    Scheduled Meds: . aspirin EC  81 mg Oral Daily  . atorvastatin  40 mg Oral Daily  . carvedilol  6.25 mg Oral BID WC  . folic acid  1 mg Oral Daily  . furosemide  40 mg Intravenous BID  . heparin  5,000 Units Subcutaneous Q8H  . insulin aspart  0-5 Units Subcutaneous QHS  . insulin aspart  0-9 Units Subcutaneous TID WC  . levothyroxine  50 mcg Oral QAC breakfast  . magnesium oxide  400 mg Oral Daily  . multivitamin with minerals  1 tablet Oral Daily  . potassium chloride  20 mEq Oral BID  . protein supplement shake  11 oz Oral BID BM  . sacubitril-valsartan  1 tablet Oral BID  . sodium chloride flush  3 mL Intravenous Q12H  . sodium chloride flush  3 mL Intravenous Q12H  . thiamine  100 mg Oral Daily   Continuous Infusions: . sodium chloride     PRN Meds: sodium chloride, docusate sodium, ipratropium-albuterol, LORazepam, sodium chloride flush, sodium chloride flush   Vital Signs    Vitals:   11/19/17 1612 11/19/17 1917 11/20/17 0338 11/20/17 0805  BP: (!) 136/93 131/82 119/79 (!) 132/98  Pulse: 86 95 78 83  Resp: 18 18 18    Temp: 98.1 F (36.7 C) 98 F (36.7 C) 98.5 F (36.9 C) 97.9 F (36.6 C)  TempSrc: Oral Oral Oral Oral  SpO2: 100% 97% 93% 99%  Weight:   194 lb 8 oz (88.2 kg)   Height:        Intake/Output Summary (Last 24 hours) at 11/20/2017  0951 Last data filed at 11/20/2017 0737 Gross per 24 hour  Intake 890 ml  Output 4920 ml  Net -4030 ml   Filed Weights   11/19/17 0509 11/19/17 0847 11/20/17 0338  Weight: 200 lb 9.6 oz (91 kg) 200 lb (90.7 kg) 194 lb 8 oz (88.2 kg)    Telemetry    NSR, 7 beats of atrial tach, rare PVC - Personally Reviewed  ECG    n/a - Personally Reviewed  Physical Exam   GEN: No acute distress.   Neck: No JVD. Cardiac: RRR, no murmurs, rubs, or gallops. Cardiac cath site without bleeding, bruising, swelling, erythema, warmth, or TTP. Radial pulse 2+. Respiratory: Clear to auscultation bilaterally.  GI: Soft, nontender, non-distended.   MS: Trace bilateral pre-tibial edema; No deformity. Neuro:  Alert and oriented x 3; Nonfocal.  Psych: Normal affect.  Labs    Chemistry Recent Labs  Lab 11/13/17 2200  11/15/17 0546  11/17/17 0538 11/18/17 0418 11/19/17 0600 11/20/17 0433  NA  --    < > 134*   < > 135 138 139 140  K  --    < > 4.8   < > 3.5 3.5 3.7 3.7  CL  --    < > 96*   < >  96* 96* 99* 98*  CO2  --    < > 20*   < > 28 30 31  34*  GLUCOSE  --    < > 344*   < > 182* 157* 195* 132*  BUN  --    < > 41*   < > 62* 55* 46* 38*  CREATININE  --    < > 1.59*   < > 1.36* 1.44* 1.06 1.04  CALCIUM  --    < > 8.8*   < > 8.7* 9.1 9.0 9.1  PROT 7.6  --  7.2  --  7.3  --   --   --   ALBUMIN 3.7  --  3.5  --  3.7  --   --   --   AST 44*  --  32  --  37  --   --   --   ALT 18  --  16*  --  21  --   --   --   ALKPHOS 98  --  92  --  137*  --   --   --   BILITOT 3.2*  --  1.7*  --  1.1  --   --   --   GFRNONAA  --    < > 46*   < > 55* 52* >60 >60  GFRAA  --    < > 53*   < > >60 60* >60 >60  ANIONGAP  --    < > 18*   < > 11 12 9 8    < > = values in this interval not displayed.     Hematology Recent Labs  Lab 11/14/17 0602  WBC 3.3*  RBC 4.48  HGB 14.1  HCT 44.3  MCV 98.8  MCH 31.5  MCHC 31.8*  RDW 15.1*  PLT 115*    Cardiac Enzymes Recent Labs  Lab 11/13/17 1439  11/13/17 2024  TROPONINI 0.03* <0.03   No results for input(s): TROPIPOC in the last 168 hours.   BNPNo results for input(s): BNP, PROBNP in the last 168 hours.   DDimer No results for input(s): DDIMER in the last 168 hours.   Radiology    No results found.  Cardiac Studies   [facility] 11/19/2017: Coronary Findings   Diagnostic  Dominance: Right  Left Main  Vessel is angiographically normal.  Left Anterior Descending  Prox LAD lesion 60% stenosed  Prox LAD lesion is 60% stenosed. The lesion is severely calcified.  Dist LAD lesion 40% stenosed  Dist LAD lesion is 40% stenosed.  First Diagonal Branch  There is mild disease in the vessel.  Second Diagonal Branch  There is mild disease in the vessel.  Third Diagonal Branch  The vessel exhibits minimal luminal irregularities.  Left Circumflex  There is mild diffuse disease throughout the vessel.  First Obtuse Marginal Branch  There is mild disease in the vessel.  Second Obtuse Marginal Branch  There is mild disease in the vessel.  Third Obtuse Marginal Branch  There is mild disease in the vessel.  Right Coronary Artery  There is mild diffuse disease throughout the vessel.  Mid RCA lesion 40% stenosed  Mid RCA lesion is 40% stenosed.  Right Posterior Descending Artery  The vessel exhibits minimal luminal irregularities.  Right Posterior Atrioventricular Branch  The vessel exhibits minimal luminal irregularities.  First Right Posterolateral  The vessel exhibits minimal luminal irregularities.  Second Right Posterolateral  The vessel exhibits minimal luminal irregularities.  Intervention  No interventions have been documented.  Wall Motion              Left Heart   Left Ventricle The left ventricle is severely dilated. There is severe left ventricular systolic dysfunction. LV end diastolic pressure is severely elevated. Calculated EF is 15%. There are LV function abnormalities due to global hypokinesis.  Coronary  Diagrams   Diagnostic Diagram         Conclusion     Prox LAD lesion is 60% stenosed.  Dist LAD lesion is 40% stenosed.  Mid RCA lesion is 40% stenosed.  There is severe left ventricular systolic dysfunction.  LV end diastolic pressure is severely elevated.   1.  Mild to moderate nonobstructive coronary artery disease with heavily calcified vessels.  The worst stenosis is 60% in the proximal LAD. 2.  Severely reduced LV systolic function with an EF of 15% and global hypokinesis.   3.  Right heart catheterization showed moderately to severely elevated filling pressures, moderate pulmonary hypertension and mildly reduced cardiac output.  RA pressure: 4 mmHg, RV pressure is 51 over 11 mmHg, PA pressure was 46/30 with a mean of 38 mmHg.  Pulmonary wedge pressure was 28 mmHg, cardiac output was 4.62 and cardiac index was 2.21.     Patient Profile     58 y.o. male with history of chronic systolic CHF diagnosed in 08/2017 with an EF of 20-25% by TTE without prior ischemic workup, polysubstance abuse with ongoing cocaine, alcohol, and tobacco abuse, medication noncompliance, HTN, thrombocytopenia, and obesitywho was admitted 2/1 with acute on chronic systolic CHF in the setting of noncompliance.  Assessment & Plan    1. Acute on chronic systolic CHF secondary to NICM with low output and pulmonary hypertension: -Volume status much improved -Continue IV Lasix 40 mg bid with KCl repletion and close monitoring of renal function -Can possibly be transitioned to PO Lasix or torsemide on 2/9 -Coreg, Entresto -Consider adding spironolactone prior to discharge -CHF education -Daily weights, strict Is and Os  2. Nonobstructive CAD: -No chest pain -LHC as detailed above -Medical management with aggressive risk factor modification -ASA, Lipitor, Coreg -Post cath instructions discussed   3. CKD stage III: -Stable -Monitor with diuresis   4. HTN: -Stable  -Continue current  medications  5. Polysubstance abuse: -Cessation advised  6. NSVT: -No furhter episodes on tele -Magnesium improving, continue repletion to goal > 2.0   7. SVT: -7 beats of atrial tach vs SVT -Replete potassium to goal > 4.0 -Coreg  8. Abnormal TSH: -Defer to IM  For questions or updates, please contact [facility] Please consult [email or web address] for contact info under Cardiology/STEMI.    Signed, Eula Listen, PA-C [facility] Pager: [phone] 11/20/2017, 9:51 AM

## 2017-11-20 NOTE — Plan of Care (Signed)
  Progressing Clinical Measurements: Will remain free from infection 11/20/2017 0524 - Progressing by Myles Gip, RN Cardiovascular complication will be avoided 11/20/2017 0524 - Progressing by Myles Gip, RN Safety: Ability to remain free from injury will improve 11/20/2017 0524 - Progressing by Myles Gip, RN 11/20/2017 [phone] - Progressing by Myles Gip, RN Skin Integrity: Risk for impaired skin integrity will decrease 11/20/2017 0524 - Progressing by Myles Gip, RN 11/20/2017 [phone] - Progressing by Myles Gip, RN

## 2017-11-20 NOTE — Progress Notes (Signed)
[employer] - [place] at [facility]   PATIENT NAME: Marcus Foster    MR#:  [id number]  DATE OF BIRTH:  May 29, 1961  SUBJECTIVE:  CHIEF COMPLAINT:   Chief Complaint  Patient presents with  . Shortness of Breath  . Congestive Heart Failure     Came with weight gain, edema, congestive heart failure.  Feels  better today.   Have good diuresis far, renal Improved, cath done, showed Some CAD- no need for stent.   Cardio suggest to continue IV diuresis for 1-2 more days. Likely switch to oral tomorrow.   REVIEW OF SYSTEMS:  CONSTITUTIONAL: No fever, fatigue or weakness.  EYES: No blurred or double vision.  EARS, NOSE, AND THROAT: No tinnitus or ear pain.  RESPIRATORY: No cough, shortness of breath, wheezing or hemoptysis.  CARDIOVASCULAR: No chest pain, orthopnea, edema.  GASTROINTESTINAL: No nausea, vomiting, diarrhea or abdominal pain.  GENITOURINARY: No dysuria, hematuria.  ENDOCRINE: No polyuria, nocturia,  HEMATOLOGY: No anemia, easy bruising or bleeding SKIN: No rash or lesion. MUSCULOSKELETAL: No joint pain or arthritis.   NEUROLOGIC: No tingling, numbness, weakness.  PSYCHIATRY: No anxiety or depression.   ROS  DRUG ALLERGIES:  No Known Allergies  VITALS:  Blood pressure (!) 132/98, pulse 83, temperature 97.9 F (36.6 C), temperature source Oral, resp. rate 18, height 5\' 10"  (1.778 m), weight 88.2 kg (194 lb 8 oz), SpO2 99 %.  PHYSICAL EXAMINATION:   GENERAL:  57 y.o.-year-old patient lying in the bed with no acute distress.  EYES: Pupils equal, round, reactive to light and accommodation. No scleral icterus. Extraocular muscles intact.  HEENT: Head atraumatic, normocephalic. Oropharynx and nasopharynx clear.  NECK:  Supple, no jugular venous distention. No thyroid enlargement, no tenderness.  LUNGS: Normal breath sounds bilaterally, no wheezing,  no  crepitation. No use of accessory muscles of respiration.  CARDIOVASCULAR: S1, S2 normal. No murmurs,  rubs, or gallops.  ABDOMEN: Soft, nontender, nondistended. Bowel sounds present. No organomegaly or mass.  EXTREMITIES: Bilateral  some pedal edema,  better now, no cyanosis, or clubbing.  NEUROLOGIC: Cranial nerves II through XII are intact. Muscle strength 5/5 in all extremities. Sensation intact. Gait not checked.  PSYCHIATRIC: The patient is alert and oriented x 3.  SKIN: No obvious rash, lesion, or ulcer.   Physical Exam LABORATORY PANEL:   CBC Recent Labs  Lab 11/14/17 0602  WBC 3.3*  HGB 14.1  HCT 44.3  PLT 115*   ------------------------------------------------------------------------------------------------------------------  Chemistries  Recent Labs  Lab 11/17/17 0538  11/20/17 0433  NA 135   < > 140  K 3.5   < > 3.7  CL 96*   < > 98*  CO2 28   < > 34*  GLUCOSE 182*   < > 132*  BUN 62*   < > 38*  CREATININE 1.36*   < > 1.04  CALCIUM 8.7*   < > 9.1  MG 1.5*   < > 1.8  AST 37  --   --   ALT 21  --   --   ALKPHOS 137*  --   --   BILITOT 1.1  --   --    < > = values in this interval not displayed.   ------------------------------------------------------------------------------------------------------------------  Cardiac Enzymes Recent Labs  Lab 11/13/17 2024  TROPONINI <0.03   ------------------------------------------------------------------------------------------------------------------  RADIOLOGY:  No results found.  ASSESSMENT AND PLAN:   Principal Problem:   Acute on chronic systolic CHF (congestive heart failure) (HCC) Active Problems:  HTN (hypertension)   Alcohol abuse   Smoker   Cocaine abuse (HCC)   Nonischemic cardiomyopathy (HCC)   * Generalized edema,   Acute on chronic systolic congestive heart failure    IV Lasix- increased ( 80 mg BID) dose, restriction on fluid, intake and output measurement. negative volume so far  cardiology consult appreciated.   Recent echocardiogram showed EF of 25%.   Continue home  medications like Coreg, atorvastatin.   As per pharmacy record, patient is not taking enteresto- so I will leave it up to cardiologist to start that.    Has slight worsening in renal function, so would hold Lasix for cath  May need to start spironolactone before discharge.   Cath showed CAD, but not enough for stent.   Increased wedge pressure, so resume IV lasix- now to change to oral tomorrow.  * Hypertension   Continue carvedilol.  * CKD stage III   Monitor with IV Lasix.   * Alcohol abuse   Continue vitamin supplementation, we may help to keep him on CIWA if any withdrawal symptoms, currently none.   Vitamin supplementation.  * Smoking   Counseled to quit smoking for 4 minutes and offered nicotine patch.  * elevated LFTs   Patient is alcoholic, monitor ,stable.  * Hypomagnesemia, due to alcoholism   Replace IV.  * Hypothyroidism   Start on low-dose levothyroxine.  * Hyperglycemia   Likely secondary to IV steroids   Stop the steroid as there is no wheezing, keep on sliding scale coverage.   Hemoglobin A1c 5.5  All the records are reviewed and case discussed with Care Management/Social Workerr. Management plans discussed with the patient, family and they are in agreement.  CODE STATUS: Full.  TOTAL TIME TAKING CARE OF THIS PATIENT: 35 minutes.    POSSIBLE D/C IN 1-2 DAYS, DEPENDING ON CLINICAL CONDITION.   Altamese Dilling M.D on 11/20/2017   Between 7am to 6pm - Pager - [phone]  After 6pm go to [email or web address] - password [email or web address]  [facility]  Office  [phone]  CC: Primary care physician; Armando Gang, FNP  Note: This dictation was prepared with Dragon dictation along with smaller phrase technology. Any transcriptional errors that result from this process are unintentional.

## 2017-11-21 DIAGNOSIS — I428 Other cardiomyopathies: Secondary | ICD-10-CM

## 2017-11-21 DIAGNOSIS — I10 Essential (primary) hypertension: Secondary | ICD-10-CM

## 2017-11-21 LAB — GLUCOSE, CAPILLARY
Glucose-Capillary: 192 mg/dL — ABNORMAL HIGH (ref 65–99)
Glucose-Capillary: 260 mg/dL — ABNORMAL HIGH (ref 65–99)

## 2017-11-21 MED ORDER — FOLIC ACID 1 MG PO TABS: 1.0000 mg | ORAL_TABLET | Freq: Every day | ORAL | 0 refills | Status: AC

## 2017-11-21 MED ORDER — CARVEDILOL 6.25 MG PO TABS: 6.2500 mg | ORAL_TABLET | Freq: Two times a day (BID) | ORAL | 3 refills | Status: DC

## 2017-11-21 MED ORDER — POTASSIUM CHLORIDE CRYS ER 20 MEQ PO TBCR: 20.0000 meq | EXTENDED_RELEASE_TABLET | Freq: Two times a day (BID) | ORAL | 3 refills | Status: DC

## 2017-11-21 MED ORDER — MAGNESIUM OXIDE 400 (241.3 MG) MG PO TABS: 400.0000 mg | ORAL_TABLET | Freq: Every day | ORAL | 3 refills | Status: AC

## 2017-11-21 MED ORDER — ATORVASTATIN CALCIUM 40 MG PO TABS: 40.0000 mg | ORAL_TABLET | Freq: Every day | ORAL | 3 refills | Status: DC

## 2017-11-21 MED ORDER — ASPIRIN 81 MG PO TBEC: 81.0000 mg | DELAYED_RELEASE_TABLET | Freq: Every day | ORAL | 3 refills | Status: DC

## 2017-11-21 MED ORDER — FUROSEMIDE 40 MG PO TABS
40.0000 mg | ORAL_TABLET | Freq: Two times a day (BID) | ORAL | Status: DC
  Administered 2017-11-21: 40 mg via ORAL

## 2017-11-21 MED ORDER — SACUBITRIL-VALSARTAN 24-26 MG PO TABS: 1.0000 | ORAL_TABLET | Freq: Two times a day (BID) | ORAL | 3 refills | Status: DC

## 2017-11-21 MED ORDER — LEVOTHYROXINE SODIUM 50 MCG PO TABS: 50.0000 ug | ORAL_TABLET | Freq: Every day | ORAL | 3 refills | Status: DC

## 2017-11-21 MED ORDER — MAGNESIUM OXIDE 400 (241.3 MG) MG PO TABS: 400.0000 mg | ORAL_TABLET | Freq: Every day | ORAL | 3 refills | Status: DC

## 2017-11-21 MED ORDER — FUROSEMIDE 40 MG PO TABS: 40.0000 mg | ORAL_TABLET | Freq: Two times a day (BID) | ORAL | 3 refills | Status: DC

## 2017-11-21 MED ORDER — ASPIRIN 81 MG PO TBEC: 81.0000 mg | DELAYED_RELEASE_TABLET | Freq: Every day | ORAL | 3 refills | Status: AC

## 2017-11-21 NOTE — Progress Notes (Signed)
[employer] - [place] at [facility]   PATIENT NAME: Marcus Foster    MR#:  [id number]  DATE OF BIRTH:  1960-12-15  SUBJECTIVE:  CHIEF COMPLAINT:   Chief Complaint  Patient presents with  . Shortness of Breath  . Congestive Heart Failure   -Came in with anasarca and difficulty breathing. Appears much improved. Received IV diuresis and improved edema  REVIEW OF SYSTEMS:  Review of Systems  Constitutional: Negative for chills, fever and malaise/fatigue.  HENT: Negative for congestion, ear discharge, hearing loss and nosebleeds.   Eyes: Negative for blurred vision and double vision.  Respiratory: Negative for cough, shortness of breath and wheezing.   Cardiovascular: Negative for chest pain, palpitations and claudication.  Gastrointestinal: Negative for abdominal pain, constipation, diarrhea, nausea and vomiting.  Genitourinary: Negative for dysuria and urgency.  Musculoskeletal: Negative for myalgias.  Neurological: Negative for dizziness, speech change, focal weakness, seizures and headaches.  Psychiatric/Behavioral: Negative for depression.    DRUG ALLERGIES:  No Known Allergies  VITALS:  Blood pressure 119/88, pulse 86, temperature (!) 97.5 F (36.4 C), temperature source Oral, resp. rate 17, height 5\' 10"  (1.778 m), weight 87.5 kg (193 lb), SpO2 97 %.  PHYSICAL EXAMINATION:  Physical Exam  GENERAL:  57 y.o.-year-old patient lying in the bed with no acute distress.  EYES: Pupils equal, round, reactive to light and accommodation. No scleral icterus. Extraocular muscles intact.  HEENT: Head atraumatic, normocephalic. Oropharynx and nasopharynx clear.  NECK:  Supple, no jugular venous distention. No thyroid enlargement, no tenderness.  LUNGS: Normal breath sounds bilaterally, no wheezing, rhonchi or crepitation. No use of accessory muscles of respiration. Fine bibasilar crackles. CARDIOVASCULAR: S1, S2 normal. No murmurs, rubs, or gallops.  ABDOMEN: Soft,  nontender, nondistended. Bowel sounds present. No organomegaly or mass.  EXTREMITIES: No cyanosis, or clubbing. 2+ lower extremity edema NEUROLOGIC: Cranial nerves II through XII are intact. Muscle strength 5/5 in all extremities. Sensation intact. Gait not checked.  PSYCHIATRIC: The patient is alert and oriented x 3.  SKIN: No obvious rash, lesion, or ulcer.    LABORATORY PANEL:   CBC Recent Labs  Lab 11/20/17 1638  WBC 4.4  HGB 13.0  HCT 40.2  PLT 121*   ------------------------------------------------------------------------------------------------------------------  Chemistries  Recent Labs  Lab 11/17/17 0538  11/20/17 0433  NA 135   < > 140  K 3.5   < > 3.7  CL 96*   < > 98*  CO2 28   < > 34*  GLUCOSE 182*   < > 132*  BUN 62*   < > 38*  CREATININE 1.36*   < > 1.04  CALCIUM 8.7*   < > 9.1  MG 1.5*   < > 1.8  AST 37  --   --   ALT 21  --   --   ALKPHOS 137*  --   --   BILITOT 1.1  --   --    < > = values in this interval not displayed.   ------------------------------------------------------------------------------------------------------------------  Cardiac Enzymes No results for input(s): TROPONINI in the last 168 hours. ------------------------------------------------------------------------------------------------------------------  RADIOLOGY:  No results found.  EKG:   Orders placed or performed during the hospital encounter of 11/13/17  . ED EKG within 10 minutes  . ED EKG within 10 minutes    ASSESSMENT AND PLAN:   57 year old male with chronic systolic CHF, noncompliant with medications, polysubstance abuse, smoking and alcohol use, hypertension, from cytopenia presents to hospital secondary to worsening shortness of  breath.  1. Acute on chronic systolic CHF exacerbation-causing pulmonary edema and anasarca on presentation. -Has not ischemic cardiomyopathy with EF of 20-25% and also pulmonary hypertension. -Diuresed with IV Lasix twice a day  since admission and has been as negative by almost 15 L since admission -Appreciate cardiology consult. Continue Coreg, and entresto - ? Aldactone at discharge if blood pressure permits - counseled about low sodium diet, compliance to meds  2. CK D stage III-stable with diuresis.  3. Hypertension-continue current medications  4. Polysubstance abuse, counseled on admission  5. Hypothyroidism-Synthroid added  Stable for discharge today   All the records are reviewed and case discussed with Care Management/Social Workerr. Management plans discussed with the patient, family and they are in agreement.  CODE STATUS: Full Code  TOTAL TIME TAKING CARE OF THIS PATIENT: 38 minutes.   POSSIBLE D/C IN 1-2 DAYS, DEPENDING ON CLINICAL CONDITION.   Tavarus Poteete M.D on 11/21/2017 at 10:51 AM  Between 7am to 6pm - Pager - [phone]  After 6pm go to [email or web address] - Social research officer, government  [facility]  Office  [phone]  CC: Primary care physician; Armando Gang, FNP

## 2017-11-21 NOTE — Progress Notes (Signed)
Progress Note  Patient Name: Marcus Foster Date of Encounter: 11/21/2017  Primary Cardiologist: Mariah Milling  Subjective   SOB improved.  Has not ambulated very far Denies any abdominal bloating, leg edema, shortness of breath walking around his room Feels he is ready to go home Loss history of noncompliance, missing his Lasix on weekends High salt diet Education started   Underwent [facility] 11/19/17 that demonstrated mild to moderate nonobstructive CAD as detailed below with severely elevated filling pressures, moderate pulmonary hypertension and mildly reduced cardiac output. R  Inpatient Medications    Scheduled Meds: . aspirin EC  81 mg Oral Daily  . atorvastatin  40 mg Oral Daily  . carvedilol  6.25 mg Oral BID WC  . folic acid  1 mg Oral Daily  . furosemide  40 mg Oral BID  . heparin  5,000 Units Subcutaneous Q8H  . insulin aspart  0-5 Units Subcutaneous QHS  . insulin aspart  0-9 Units Subcutaneous TID WC  . levothyroxine  50 mcg Oral QAC breakfast  . magnesium oxide  400 mg Oral Daily  . multivitamin with minerals  1 tablet Oral Daily  . potassium chloride  20 mEq Oral BID  . protein supplement shake  11 oz Oral BID BM  . sacubitril-valsartan  1 tablet Oral BID  . sodium chloride flush  3 mL Intravenous Q12H  . sodium chloride flush  3 mL Intravenous Q12H  . thiamine  100 mg Oral Daily   Continuous Infusions: . sodium chloride     PRN Meds: sodium chloride, docusate sodium, ipratropium-albuterol, LORazepam, sodium chloride flush, sodium chloride flush   Vital Signs    Vitals:   11/20/17 1947 11/21/17 0427 11/21/17 0500 11/21/17 0831  BP: (!) 132/92 115/76  119/88  Pulse: 95 81  86  Resp: 17     Temp: (!) 97.5 F (36.4 C) 97.9 F (36.6 C)  (!) 97.5 F (36.4 C)  TempSrc: Oral Oral  Oral  SpO2: 99% 97%  97%  Weight:   193 lb (87.5 kg)   Height:        Intake/Output Summary (Last 24 hours) at 11/21/2017 1339 Last data filed at 11/21/2017 1145 Gross per 24 hour   Intake 860 ml  Output 2350 ml  Net -1490 ml   Filed Weights   11/19/17 0847 11/20/17 0338 11/21/17 0500  Weight: 200 lb (90.7 kg) 194 lb 8 oz (88.2 kg) 193 lb (87.5 kg)    Telemetry    NSR, --no other significant arrhythmia noted --personally Reviewed  ECG    n/a - Personally Reviewed  Physical Exam   GEN: No acute distress.   Neck: No JVD. Cardiac: RRR, no murmurs, rubs, or gallops.  Radial pulse 2+.  No significant lower extremity edema Respiratory: Clear to auscultation bilaterally.  GI: Soft, nontender, non-distended.   MS:  No deformity. Neuro:  Alert and oriented x 3; Nonfocal.  Psych: Normal affect.  Labs    Chemistry Recent Labs  Lab 11/15/17 [phone]  11/17/17 0538 11/18/17 0418 11/19/17 0600 11/20/17 0433  NA 134*   < > 135 138 139 140  K 4.8   < > 3.5 3.5 3.7 3.7  CL 96*   < > 96* 96* 99* 98*  CO2 20*   < > 28 30 31  34*  GLUCOSE 344*   < > 182* 157* 195* 132*  BUN 41*   < > 62* 55* 46* 38*  CREATININE 1.59*   < > 1.36*  1.44* 1.06 1.04  CALCIUM 8.8*   < > 8.7* 9.1 9.0 9.1  PROT 7.2  --  7.3  --   --   --   ALBUMIN 3.5  --  3.7  --   --   --   AST 32  --  37  --   --   --   ALT 16*  --  21  --   --   --   ALKPHOS 92  --  137*  --   --   --   BILITOT 1.7*  --  1.1  --   --   --   GFRNONAA 46*   < > 55* 52* >60 >60  GFRAA 53*   < > >60 60* >60 >60  ANIONGAP 18*   < > 11 12 9 8    < > = values in this interval not displayed.     Hematology Recent Labs  Lab 11/20/17 1638  WBC 4.4  RBC 4.12*  HGB 13.0  HCT 40.2  MCV 97.7  MCH 31.7  MCHC 32.4  RDW 14.9*  PLT 121*    Cardiac Enzymes  Conclusion     Prox LAD lesion is 60% stenosed.  Dist LAD lesion is 40% stenosed.  Mid RCA lesion is 40% stenosed.  There is severe left ventricular systolic dysfunction.  LV end diastolic pressure is severely elevated.   1.  Mild to moderate nonobstructive coronary artery disease with heavily calcified vessels.  The worst stenosis is 60% in the  proximal LAD. 2.  Severely reduced LV systolic function with an EF of 15% and global hypokinesis.   3.  Right heart catheterization showed moderately to severely elevated filling pressures, moderate pulmonary hypertension and mildly reduced cardiac output.  RA pressure: 4 mmHg, RV pressure is 51 over 11 mmHg, PA pressure was 46/30 with a mean of 38 mmHg.  Pulmonary wedge pressure was 28 mmHg, cardiac output was 4.62 and cardiac index was 2.21.     Patient Profile     57 y.o. male with history of chronic systolic CHF diagnosed in 08/2017 with an EF of 20-25% by TTE without prior ischemic workup, polysubstance abuse with ongoing cocaine, alcohol, and tobacco abuse, medication noncompliance, HTN, thrombocytopenia, and obesitywho was admitted 2/1 with acute on chronic systolic CHF in the setting of noncompliance.  Assessment & Plan    1. Acute on chronic systolic CHF secondary to NICM with low output and pulmonary hypertension: -CHF education provided Recommended daily weights at home, adjusting his diuretics for 3 pound weight gain Stressed importance of compliance of his medications Was missing doses at home, eating poor food -Would try again at Lasix 40 twice daily Higher doses do not help if he is noncompliant with his pills Continue carvedilol, entresto,  could add Aldactone 25 daily Close follow-up in CHF clinic and our clinic  2. Nonobstructive CAD: -LHC as detailed above -ASA, Lipitor, Coreg  3. CKD stage III: -Stable  4. HTN: -Continue current medications  5. Polysubstance abuse: -Cessation advised Particularly alcohol cessation  6. NSVT: -No furhter episodes on tele   Total encounter time more than 25 minutes  Greater than 50% was spent in counseling and coordination of care with the patient   For questions or updates, please contact [facility] Please consult [email or web address] for contact info under Cardiology/STEMI.    Signed, Signed, Dossie Arbour, MD,  Ph.D [facility]

## 2017-11-21 NOTE — Progress Notes (Signed)
Pt is to be discharged to home today. Iv and tele removed. disch instructions and prescrips given to hime. Friend will transport him.

## 2017-11-21 NOTE — Clinical Social Work Note (Signed)
Clinical Social Work Assessment  Patient Details  Name: Marcus Foster MRN: [id number] Date of Birth: 09-28-1961  Date of referral:  11/21/17               Reason for consult:  Substance Use/ETOH Abuse                Permission sought to share information with:    Permission granted to share information::     Name::        Agency::     Relationship::     Contact Information:     Housing/Transportation Living arrangements for the past 2 months:  Single Family Home Source of Information:  Patient, Medical Team Patient Interpreter Needed:  None Criminal Activity/Legal Involvement Pertinent to Current Situation/Hospitalization:  No - Comment as needed Significant Relationships:  Spouse Lives with:  Spouse Do you feel safe going back to the place where you live?  Yes Need for family participation in patient care:  No (Coment)  Care giving concerns:  Patient has history of cocaine (Crack) use which exacerbates his CHF   Social Worker assessment / plan:  CSW met with the patient at bedside to discuss concerns for continued substance use. The patient agreed to discuss his history and indicated that he uses cocaine by smoking "every so often" with his last use on 11/10/17 to celebrate his birthday. The CSW provided psychoeducation about the effects of cocaine use on heart health as well as options in the community for substance treatment. The patient refused the information; however, he did indicate that he would like information about social security disability. The CSW has provided this information to his nurse to deliver to the patient.   The patient will discharge home today. CSW is signing off.    Employment status:  [employer] information:  Managed Care PT Recommendations:  Not assessed at this time Information / Referral to community resources:  Outpatient Substance Abuse Treatment Options  Patient/Family's Response to care:  The patient thanked the CSW.  Patient/Family's  Understanding of and Emotional Response to Diagnosis, Current Treatment, and Prognosis:  The patient is in the contemplation stage of change. He is not willing to enter substance use treatment.  Emotional Assessment Appearance:  Appears stated age Attitude/Demeanor/Rapport:  Self-Confident, Engaged Affect (typically observed):  Appropriate, Pleasant Orientation:  Oriented to Self, Oriented to Place, Oriented to  Time, Oriented to Situation Alcohol / Substance use:  Illicit Drugs(Crack Cocaine) Psych involvement (Current and /or in the community):     Discharge Needs  Concerns to be addressed:  Compliance Issues Concerns, Substance Abuse Concerns Readmission within the last 30 days:  Yes Current discharge risk:  Substance Abuse Barriers to Discharge:  No Barriers Identified   Zettie Pho, LCSW 11/21/2017, 1:59 PM

## 2017-11-23 NOTE — Discharge Summary (Signed)
[employer] [facility] [place] [facility]   PATIENT NAME: Marcus Foster    MR#:  [id number]  DATE OF BIRTH:  [birth year]  DATE OF ADMISSION:  11/13/2017   ADMITTING PHYSICIAN: Altamese Dilling, MD  DATE OF DISCHARGE: 11/21/2017  4:05 PM  PRIMARY CARE PHYSICIAN: Armando Gang, FNP   ADMISSION DIAGNOSIS:   CHF (congestive heart failure) (HCC) [I50.9] Cocaine abuse (HCC) [F14.10] Acute congestive heart failure, unspecified heart failure type (HCC) [I50.9]  DISCHARGE DIAGNOSIS:   Principal Problem:   Acute on chronic systolic CHF (congestive heart failure) (HCC) Active Problems:   HTN (hypertension)   Alcohol abuse   Smoker   Cocaine abuse (HCC)   Nonischemic cardiomyopathy (HCC)   SECONDARY DIAGNOSIS:   Past Medical History:  Diagnosis Date  . Acid reflux   . Asthma   . Chronic systolic CHF (congestive heart failure) (HCC)    a. TTE 08/2017: EF 20-25%, moderate LVH, unable to exclude RWMA, mild to moderate MR, moderately dilated LV, mildly dilated RV with moderately reduced RVSF, mildly dilated RA, trivial pericardial effusion  . Hypertension   . Noncompliance   . Obesity   . Polysubstance abuse (HCC)    a. ongoing cocaine, tobacco, and alcohol abuse    HOSPITAL COURSE:   57 year old male with chronic systolic CHF, noncompliant with medications, polysubstance abuse, smoking and alcohol use, hypertension, from cytopenia presents to hospital secondary to worsening shortness of breath.  1. Acute on chronic systolic CHF exacerbation-causing pulmonary edema and anasarca on presentation. -has Known ischemic cardiomyopathy with EF of 20-25% and also pulmonary hypertension. -Diuresed with IV Lasix twice a day since admission and has been as negative by almost 15 L since admission -Appreciate cardiology consult. Continue Coreg, and entresto - ? Aldactone as outpatient if blood pressure permits - counseled about low sodium diet, compliance to meds  2.  CKD stage III-stable with diuresis.  3. Hypertension-continue current medications  4. Polysubstance abuse, counseled on admission  5. Hypothyroidism-Synthroid added  Discharge today   DISCHARGE CONDITIONS:   Guarded  CONSULTS OBTAINED:   Treatment Team:  Antonieta Iba, MD  DRUG ALLERGIES:   No Known Allergies DISCHARGE MEDICATIONS:   Allergies as of 11/21/2017   No Known Allergies     Medication List    STOP taking these medications   multivitamin with minerals Tabs tablet   thiamine 100 MG tablet     TAKE these medications   aspirin 81 MG EC tablet Take 1 tablet (81 mg total) by mouth daily.   atorvastatin 40 MG tablet Commonly known as:  LIPITOR Take 1 tablet (40 mg total) by mouth daily.   carvedilol 6.25 MG tablet Commonly known as:  COREG Take 1 tablet (6.25 mg total) by mouth 2 (two) times daily.   folic acid 1 MG tablet Commonly known as:  FOLVITE Take 1 tablet (1 mg total) by mouth daily.   furosemide 40 MG tablet Commonly known as:  LASIX Take 1 tablet (40 mg total) by mouth 2 (two) times daily.   levothyroxine 50 MCG tablet Commonly known as:  SYNTHROID, LEVOTHROID Take 1 tablet (50 mcg total) by mouth daily before breakfast.   magnesium oxide 400 (241.3 Mg) MG tablet Commonly known as:  MAG-OX Take 1 tablet (400 mg total) by mouth daily.   potassium chloride SA 20 MEQ tablet Commonly known as:  K-DUR,KLOR-CON Take 1 tablet (20 mEq total) by mouth 2 (two) times daily.   sacubitril-valsartan 24-26 MG Commonly known  as:  ENTRESTO Take 1 tablet by mouth 2 (two) times daily.        DISCHARGE INSTRUCTIONS:   1. Cardiology f/u in 2 weeks  DIET:   Cardiac diet  ACTIVITY:   Activity as tolerated  OXYGEN:   Home Oxygen: No.  Oxygen Delivery: room air  DISCHARGE LOCATION:   home   If you experience worsening of your admission symptoms, develop shortness of breath, life threatening emergency, suicidal or homicidal  thoughts you must seek medical attention immediately by calling 911 or calling your MD immediately  if symptoms less severe.  You Must read complete instructions/literature along with all the possible adverse reactions/side effects for all the Medicines you take and that have been prescribed to you. Take any new Medicines after you have completely understood and accpet all the possible adverse reactions/side effects.   Please note  You were cared for by a hospitalist during your hospital stay. If you have any questions about your discharge medications or the care you received while you were in the hospital after you are discharged, you can call the unit and asked to speak with the hospitalist on call if the hospitalist that took care of you is not available. Once you are discharged, your primary care physician will handle any further medical issues. Please note that NO REFILLS for any discharge medications will be authorized once you are discharged, as it is imperative that you return to your primary care physician (or establish a relationship with a primary care physician if you do not have one) for your aftercare needs so that they can reassess your need for medications and monitor your lab values.    On the day of Discharge:  VITAL SIGNS:   Blood pressure 119/88, pulse 86, temperature (!) 97.5 F (36.4 C), temperature source Oral, resp. rate 17, height 5\' 10"  (1.778 m), weight 87.5 kg (193 lb), SpO2 97 %.  PHYSICAL EXAMINATION:    GENERAL:  57 y.o.-year-old patient lying in the bed with no acute distress.  EYES: Pupils equal, round, reactive to light and accommodation. No scleral icterus. Extraocular muscles intact.  HEENT: Head atraumatic, normocephalic. Oropharynx and nasopharynx clear.  NECK:  Supple, no jugular venous distention. No thyroid enlargement, no tenderness.  LUNGS: Normal breath sounds bilaterally, no wheezing, rhonchi or crepitation. No use of accessory muscles of  respiration. Fine bibasilar crackles. CARDIOVASCULAR: S1, S2 normal. No murmurs, rubs, or gallops.  ABDOMEN: Soft, nontender, nondistended. Bowel sounds present. No organomegaly or mass.  EXTREMITIES: No cyanosis, or clubbing. 2+ lower extremity edema NEUROLOGIC: Cranial nerves II through XII are intact. Muscle strength 5/5 in all extremities. Sensation intact. Gait not checked.  PSYCHIATRIC: The patient is alert and oriented x 3.  SKIN: No obvious rash, lesion, or ulcer.     DATA REVIEW:   CBC Recent Labs  Lab 11/20/17 1638  WBC 4.4  HGB 13.0  HCT 40.2  PLT 121*    Chemistries  Recent Labs  Lab 11/17/17 0538  11/20/17 0433  NA 135   < > 140  K 3.5   < > 3.7  CL 96*   < > 98*  CO2 28   < > 34*  GLUCOSE 182*   < > 132*  BUN 62*   < > 38*  CREATININE 1.36*   < > 1.04  CALCIUM 8.7*   < > 9.1  MG 1.5*   < > 1.8  AST 37  --   --   ALT 21  --   --  ALKPHOS 137*  --   --   BILITOT 1.1  --   --    < > = values in this interval not displayed.     Microbiology Results  No results found for this or any previous visit.  RADIOLOGY:  No results found.   Management plans discussed with the patient, family and they are in agreement.  CODE STATUS:  Code Status History    Date Active Date Inactive Code Status Order ID Comments User Context   11/13/2017 14:53 11/21/2017 19:19 Full Code [id number]  Altamese Dilling, MD Inpatient   09/07/2017 00:01 09/09/2017 19:24 Full Code [id number]  SalaryEvelena Asa, MD Inpatient      TOTAL TIME TAKING CARE OF THIS PATIENT: 38 minutes.    Enid Baas M.D on 11/23/2017 at 3:19 PM  Between 7am to 6pm - Pager - [phone]  After 6pm go to [email or web address] - Social research officer, government  [facility]  Office  [phone]  CC: Primary care physician; Armando Gang, FNP   Note: This dictation was prepared with Dragon dictation along with smaller phrase technology. Any transcriptional errors that result  from this process are unintentional.

## 2017-11-24 ENCOUNTER — Telehealth: Payer: Self-pay | Admitting: *Deleted

## 2017-11-24 NOTE — Telephone Encounter (Signed)
-----   Message from Coralee Rud sent at 11/24/2017 12:04 PM EST ----- Regarding: tcm/ph 2/22  9:00 Dr. Mariah Milling

## 2017-11-24 NOTE — Telephone Encounter (Signed)
Patient contacted regarding discharge from [facility] on 11/21/17.   Patient understands to follow up with provider ? On 12/04/17 at 0900am at [facility].  Patient understands discharge instructions? Yes  Patient understands medications and regiment? Yes  Patient understands to bring all medications to this visit? Yes

## 2017-11-24 NOTE — Progress Notes (Signed)
Patient ID: Marcus Foster, male    DOB: October 30, 1960, 57 y.o.   MRN: [id number]  HPI  Mr Bolyard is a 57 y/o male with a history of asthma, HTN, current tobacco/etoh use, prior cocaine use and chronic heart failure.   Echo report from 09/07/17 reviewed and showed an EF of 20-25% along with moderate MR. Cardiac catheterization done 11/19/17 showed mild/moderate nonobstructive CAD with heavily calcified vessels. Severely reduced EF of 15% along with a PA pressure of 38 mm Hg.   Admitted 11/13/17 due to acute on chronic HF. Cardiology consult obtained. Initially needed IV diuretics and lost ~ 15 liters of fluid. Discharged after 8 days.  Admitted 09/06/17 due to newly diagnosed HF most likely related to chronic cocaine/ETOH abuse. Initially needed IV lasix with transition to oral diuretics. Urine output was >10 L during the admission. Medications were adjusted and he was discharged home after 3 days.  He presents today for a follow-up visit with a chief complaint of minimal fatigue upon moderate exertion. He says this has been chronic for several months but is slowly improving. He has associated cough and rhinorrhea. He denies any shortness of breath, cough, chest pain, edema, palpitations, abdominal distention, difficulty sleeping, dizziness or weight gain.   Past Medical History:  Diagnosis Date  . Acid reflux   . Asthma   . Chronic systolic CHF (congestive heart failure) (HCC)    a. TTE 08/2017: EF 20-25%, moderate LVH, unable to exclude RWMA, mild to moderate MR, moderately dilated LV, mildly dilated RV with moderately reduced RVSF, mildly dilated RA, trivial pericardial effusion  . Hypertension   . Noncompliance   . Obesity   . Polysubstance abuse (HCC)    a. ongoing cocaine, tobacco, and alcohol abuse   Past Surgical History:  Procedure Laterality Date  . COLONOSCOPY    . ESOPHAGOGASTRODUODENOSCOPY    . RIGHT/LEFT HEART CATH AND CORONARY ANGIOGRAPHY N/A 11/19/2017   Procedure: RIGHT/LEFT HEART  CATH AND CORONARY ANGIOGRAPHY;  Surgeon: Iran Ouch, MD;  Location: [facility] INVASIVE CV LAB;  Service: Cardiovascular;  Laterality: N/A;   Family History  Problem Relation Age of Onset  . Hypertension Mother    Social History   Tobacco Use  . Smoking status: Current Every Day Smoker    Packs/day: 0.25    Years: 30.00    Pack years: 7.50  . Smokeless tobacco: Never Used  Substance Use Topics  . Alcohol use: Yes    Comment: "couple shots a day" and beer   No Known Allergies  Prior to Admission medications   Medication Sig Start Date End Date Taking? Authorizing Provider  aspirin 81 MG EC tablet Take 1 tablet (81 mg total) by mouth daily. 11/22/17  Yes Enid Baas, MD  atorvastatin (LIPITOR) 40 MG tablet Take 1 tablet (40 mg total) by mouth daily. 11/21/17  Yes Enid Baas, MD  carvedilol (COREG) 6.25 MG tablet Take 1 tablet (6.25 mg total) by mouth 2 (two) times daily. 11/21/17  Yes Enid Baas, MD  folic acid (FOLVITE) 1 MG tablet Take 1 tablet (1 mg total) by mouth daily. 11/21/17  Yes Enid Baas, MD  furosemide (LASIX) 40 MG tablet Take 1 tablet (40 mg total) by mouth 2 (two) times daily. 11/21/17  Yes Enid Baas, MD  levothyroxine (SYNTHROID, LEVOTHROID) 50 MCG tablet Take 1 tablet (50 mcg total) by mouth daily before breakfast. 11/22/17  Yes Enid Baas, MD  magnesium oxide (MAG-OX) 400 (241.3 Mg) MG tablet Take 1 tablet (400  mg total) by mouth daily. 11/22/17  Yes Enid Baas, MD  potassium chloride SA (K-DUR,KLOR-CON) 20 MEQ tablet Take 1 tablet (20 mEq total) by mouth 2 (two) times daily. 11/21/17  Yes Enid Baas, MD  sacubitril-valsartan (ENTRESTO) 24-26 MG Take 1 tablet by mouth 2 (two) times daily. 11/21/17  Yes Enid Baas, MD   Review of Systems  Constitutional: Positive for fatigue. Negative for appetite change and fever.  HENT: Positive for rhinorrhea. Negative for congestion and sore throat.   Eyes: Negative.    Respiratory: Positive for cough. Negative for chest tightness and shortness of breath.   Cardiovascular: Negative for chest pain, palpitations and leg swelling.  Gastrointestinal: Negative for abdominal distention and abdominal pain.  Endocrine: Negative.   Genitourinary: Negative.   Musculoskeletal: Negative for back pain and neck pain.  Skin: Negative.   Allergic/Immunologic: Negative.   Neurological: Negative for dizziness and light-headedness.  Hematological: Negative for adenopathy. Does not bruise/bleed easily.  Psychiatric/Behavioral: Negative for dysphoric mood and sleep disturbance (sleeping on 1-2 pillows). The patient is not nervous/anxious.    Vitals:   11/25/17 1247  BP: 120/84  Pulse: 95  Resp: 18  Temp: 98.6 F (37 C)  TempSrc: Oral  SpO2: 100%  Weight: 180 lb 3.2 oz (81.7 kg)  Height: 5\' 10"  (1.778 m)   Wt Readings from Last 3 Encounters:  11/25/17 180 lb 3.2 oz (81.7 kg)  11/21/17 193 lb (87.5 kg)  10/27/17 220 lb 8 oz (100 kg)   Lab Results  Component Value Date   CREATININE 1.04 11/20/2017   CREATININE 1.06 11/19/2017   CREATININE 1.44 (H) 11/18/2017    Physical Exam  Constitutional: He is oriented to person, place, and time. He appears well-developed and well-nourished.  HENT:  Head: Normocephalic and atraumatic.  Neck: Normal range of motion. Neck supple. No JVD present.  Cardiovascular: Regular rhythm. Tachycardia present.  Pulmonary/Chest: Effort normal. He has no wheezes. He has no rales.  Abdominal: Soft. He exhibits no distension. There is no tenderness.  Musculoskeletal: He exhibits no edema or tenderness.  Neurological: He is alert and oriented to person, place, and time.  Skin: Skin is warm and dry.  Psychiatric: He has a normal mood and affect. His behavior is normal. Thought content normal.  Nursing note and vitals reviewed.  Assessment & Plan:  1: Chronic heart failure with reduced ejection fraction- - NYHA class II - euvolemic  today - weighing daily. He was instructed to call for an overnight weight gain of >2 pounds or a weekly weight gain of >5 pounds - weight down 40 pounds since 10/27/17 - does add "some" salt to his food. Reminded to closely follow a 2000mg  sodium diet - reports receiving his flu vaccine for this season already - saw cardiologist Mariah Milling) 10/27/17 & returns 12/04/17 - consider increasing entresto and/or carvedilol in the future  2: HTN- - sees PCP Clint Guy)  - BP looks good today - BMP from 11/20/17 reviewed and showed sodium 140, potassium 3.7 and GFR >60  3: Substance abuse- - currently smoking 1 cigarette daily along with 1 shot of liquor daily (down from 1 pint daily) - says that he last used cocaine on 11/10/17; tested positive on 11/13/17 - complete cessation of all above substances discussed for 3 minutes with him - explained that now that he's on carvedilol, his risk of an MI may be increased if he continues to use cocaine  4: Lymphedema- - stage 2 - no longer has any edema  Medication bottles were reviewed.  Return in 1 month or sooner for any questions/problems before then.

## 2017-11-25 ENCOUNTER — Other Ambulatory Visit: Payer: Self-pay

## 2017-11-25 ENCOUNTER — Ambulatory Visit: Payer: BLUE CROSS/BLUE SHIELD | Attending: Family | Admitting: Family

## 2017-11-25 ENCOUNTER — Encounter: Payer: Self-pay | Admitting: Family

## 2017-11-25 VITALS — BP 120/84 | HR 95 | Temp 98.6°F | Resp 18 | Ht 70.0 in | Wt 180.2 lb

## 2017-11-25 DIAGNOSIS — Z79899 Other long term (current) drug therapy: Secondary | ICD-10-CM | POA: Diagnosis not present

## 2017-11-25 DIAGNOSIS — Z8249 Family history of ischemic heart disease and other diseases of the circulatory system: Secondary | ICD-10-CM | POA: Diagnosis not present

## 2017-11-25 DIAGNOSIS — Z79890 Hormone replacement therapy (postmenopausal): Secondary | ICD-10-CM | POA: Insufficient documentation

## 2017-11-25 DIAGNOSIS — I2510 Atherosclerotic heart disease of native coronary artery without angina pectoris: Secondary | ICD-10-CM | POA: Diagnosis not present

## 2017-11-25 DIAGNOSIS — F1910 Other psychoactive substance abuse, uncomplicated: Secondary | ICD-10-CM

## 2017-11-25 DIAGNOSIS — E669 Obesity, unspecified: Secondary | ICD-10-CM | POA: Insufficient documentation

## 2017-11-25 DIAGNOSIS — Z7982 Long term (current) use of aspirin: Secondary | ICD-10-CM | POA: Diagnosis not present

## 2017-11-25 DIAGNOSIS — Z9889 Other specified postprocedural states: Secondary | ICD-10-CM | POA: Insufficient documentation

## 2017-11-25 DIAGNOSIS — I110 Hypertensive heart disease with heart failure: Secondary | ICD-10-CM | POA: Diagnosis not present

## 2017-11-25 DIAGNOSIS — I890 Lymphedema, not elsewhere classified: Secondary | ICD-10-CM

## 2017-11-25 DIAGNOSIS — F1410 Cocaine abuse, uncomplicated: Secondary | ICD-10-CM | POA: Insufficient documentation

## 2017-11-25 DIAGNOSIS — F17210 Nicotine dependence, cigarettes, uncomplicated: Secondary | ICD-10-CM | POA: Insufficient documentation

## 2017-11-25 DIAGNOSIS — I5022 Chronic systolic (congestive) heart failure: Secondary | ICD-10-CM | POA: Insufficient documentation

## 2017-11-25 DIAGNOSIS — F1010 Alcohol abuse, uncomplicated: Secondary | ICD-10-CM | POA: Insufficient documentation

## 2017-11-25 DIAGNOSIS — Z955 Presence of coronary angioplasty implant and graft: Secondary | ICD-10-CM | POA: Insufficient documentation

## 2017-11-25 DIAGNOSIS — I10 Essential (primary) hypertension: Secondary | ICD-10-CM

## 2017-11-25 NOTE — Patient Instructions (Signed)
Continue weighing daily and call for an overnight weight gain of > 2 pounds or a weekly weight gain of >5 pounds. 

## 2017-12-02 NOTE — Progress Notes (Deleted)
Cardiology Office Note  Date:  12/02/2017   ID:  Ovadia, Lopp Jan 21, 1961, MRN [id number]  PCP:  Armando Gang, FNP   No chief complaint on file.   HPI:  [name] a56 y.o.malewith a history of childhood asthma,  esophageal stricture, dysphagia, diverticulosis,  heavy alcohol abuse,  chronic tobacco smoking abuse,  chronic cocaine abuse, recent abuse Presenting to the hospital November 2018 with worsening cough, shortness of breath, dyspnea on exertion,  alcohol level 270 Diagnosed with cardiomyopathy ejection fraction 25-30% felt secondary to alcohol abuse, chronic hypertension Presents to establish care in the office, management of his cardiomyopathy, moderate mitral valve regurg   Seen in the CHF clinic September 16, 2017 Hospital records reviewed personally by myself Diuresed greater than 10 L on admission November 2018  On today's visit he has severe swelling up to his scrotum He has PND and orthopnea He does not have Lasix, only taking entresto (using samples from CHF clinic) reports he has no refills for any of his other medications  Not very active   no exercise Continues to drink and smoke Reports he is trying to cut down on his drinking  EKG personally reviewed by myself on todays visit Shows sinus tachycardia rate 107 bpm nonspecific T wave abnormality anterolateral leads   PMH:   has a past medical history of Acid reflux, Asthma, Chronic systolic CHF (congestive heart failure) (HCC), Hypertension, Noncompliance, Obesity, and Polysubstance abuse (HCC).  PSH:    Past Surgical History:  Procedure Laterality Date  . COLONOSCOPY    . ESOPHAGOGASTRODUODENOSCOPY    . RIGHT/LEFT HEART CATH AND CORONARY ANGIOGRAPHY N/A 11/19/2017   Procedure: RIGHT/LEFT HEART CATH AND CORONARY ANGIOGRAPHY;  Surgeon: Iran Ouch, MD;  Location: [facility] INVASIVE CV LAB;  Service: Cardiovascular;  Laterality: N/A;    Current Outpatient Medications  Medication Sig  Dispense Refill  . aspirin 81 MG EC tablet Take 1 tablet (81 mg total) by mouth daily. 30 tablet 3  . atorvastatin (LIPITOR) 40 MG tablet Take 1 tablet (40 mg total) by mouth daily. 30 tablet 3  . carvedilol (COREG) 6.25 MG tablet Take 1 tablet (6.25 mg total) by mouth 2 (two) times daily. 60 tablet 3  . folic acid (FOLVITE) 1 MG tablet Take 1 tablet (1 mg total) by mouth daily. 30 tablet 0  . furosemide (LASIX) 40 MG tablet Take 1 tablet (40 mg total) by mouth 2 (two) times daily. 6 tablet 3  . levothyroxine (SYNTHROID, LEVOTHROID) 50 MCG tablet Take 1 tablet (50 mcg total) by mouth daily before breakfast. 30 tablet 3  . magnesium oxide (MAG-OX) 400 (241.3 Mg) MG tablet Take 1 tablet (400 mg total) by mouth daily. 30 tablet 3  . potassium chloride SA (K-DUR,KLOR-CON) 20 MEQ tablet Take 1 tablet (20 mEq total) by mouth 2 (two) times daily. 60 tablet 3  . sacubitril-valsartan (ENTRESTO) 24-26 MG Take 1 tablet by mouth 2 (two) times daily. 60 tablet 3   No current facility-administered medications for this visit.      Allergies:   Patient has no known allergies.   Social History:  The patient  reports that he has been smoking.  He has a 7.50 pack-year smoking history. he has never used smokeless tobacco. He reports that he drinks alcohol. He reports that he uses drugs. Drug: Cocaine.   Family History:   family history includes Hypertension in his mother.    Review of Systems: Review of Systems  Constitutional: Negative.   Respiratory:  Positive for cough and shortness of breath.   Cardiovascular: Positive for leg swelling.  Gastrointestinal: Negative.   Musculoskeletal: Negative.   Neurological: Negative.   Psychiatric/Behavioral: Negative.   All other systems reviewed and are negative.    PHYSICAL EXAM: VS:  There were no vitals taken for this visit. , BMI There is no height or weight on file to calculate BMI. GEN: Well nourished, well developed, in no acute distress  HEENT:  normal  Neck: no JVD, carotid bruits, or masses Cardiac: RRR; no murmurs, rubs, or gallops, 2 + pitting edema to the legs/thighs and scrotum Respiratory: Clear with scant rails of the bases, normal work of breathing GI: soft, nontender, nondistended, + BS MS: no deformity or atrophy  Skin: warm and dry, no rash Neuro:  Strength and sensation are intact Psych: euthymic mood, full affect   Recent Labs: 11/13/2017: B Natriuretic Peptide >4,500.0; TSH 7.739 11/17/2017: ALT 21 11/20/2017: BUN 38; Creatinine, Ser 1.04; Hemoglobin 13.0; Magnesium 1.8; Platelets 121; Potassium 3.7; Sodium 140    Lipid Panel Lab Results  Component Value Date   CHOL 146 11/14/2017   HDL 78 11/14/2017   LDLCALC 55 11/14/2017   TRIG 65 11/14/2017      Wt Readings from Last 3 Encounters:  11/25/17 180 lb 3.2 oz (81.7 kg)  11/21/17 193 lb (87.5 kg)  10/27/17 220 lb 8 oz (100 kg)       ASSESSMENT AND PLAN:  Essential hypertension Medication changes as below  Chronic systolic CHF (congestive heart failure) (HCC) - Plan: EKG 12-Lead Only on one medication, entresto Would recommend him start Coreg 6.25 mill grams twice a day Lasix 40 mg twice a day with potassium 20 twice a day Stay on entresto. 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 He will continue to work on this and does not want any assistance with chantix.   Cocaine abuse (HCC) Stressed importance of cocaine cessation We'll start low-dose carvedilol After his reassurance she would not be taking cocaine  Nonischemic cardiomyopathy (HCC) - Plan: EKG 12-Lead  medication changes as above    Disposition:   F/U  6 weeks    Total encounter time more than 25 minutes  Greater than 50% was spent in counseling and coordination of care with the patient    No orders of the defined types were placed in this encounter.    Signed, Dossie Arbour, M.D., Ph.D. 12/02/2017  [facility] [place], [place] [phone]

## 2017-12-04 ENCOUNTER — Telehealth: Payer: Self-pay | Admitting: Cardiovascular Disease

## 2017-12-04 ENCOUNTER — Ambulatory Visit: Payer: BLUE CROSS/BLUE SHIELD | Admitting: Cardiovascular Disease

## 2017-12-04 NOTE — Telephone Encounter (Signed)
Received records request [place] [employer] , forwarded to [facility] for processing. Also sent Money order via interoffice, pt signed release form here in office

## 2017-12-05 NOTE — Progress Notes (Signed)
Cardiology Office Note  Date:  12/07/2017   ID:  Marcus Foster, DOB 05-24-1961, MRN [id number]  PCP:  Armando Gang, FNP   Chief Complaint  Patient presents with  . Other    6 week follow up from hospital visit. Patient c/o SOB. Meds reviewed verbally with patient.     HPI:  [name] a57 y.o.malewith a history of childhood asthma,  esophageal stricture, dysphagia, diverticulosis,  heavy alcohol abuse,  chronic tobacco smoking abuse,  chronic cocaine abuse, recent abuse Presenting to the hospital November 2018 with worsening cough, shortness of breath, dyspnea on exertion,  alcohol level 270 Diagnosed with dilated cardiomyopathy ejection fraction 25-30% felt secondary to alcohol abuse, chronic hypertension [place] history of noncompliance, missing his Lasix on weekends High salt diet Presenting for routine follow-up of his cardiomyopathy, moderate mitral valve regurg   In the hospital 11/2017 CHF Hospital records reviewed with the patient in detail admitted 2/1 with acute on chronic systolic CHF in the setting of noncompliance, ETOH abuse. Has severe leg and scrotal edema, shortness of breath Was not taking lasix 40 BID  Studies done in the hospital Echo 08/2017 EF 20 to 25%, RV mod reduced systolic function  Cardiac cath 11/19/2017  Prox LAD lesion is 60% stenosed.  Dist LAD lesion is 40% stenosed.  Mid RCA lesion is 40% stenosed.  There is severe left ventricular systolic dysfunction.  LV end diastolic pressure is severely elevated.   1.  Mild to moderate nonobstructive coronary artery disease with heavily calcified vessels.  The worst stenosis is 60% in the proximal LAD. 2.  Severely reduced LV systolic function with an EF of 15% and global hypokinesis.   3.  Right heart catheterization showed moderately to severely elevated filling pressures, moderate pulmonary hypertension and mildly reduced cardiac output.  RA pressure: 4 mmHg, RV pressure is 51 over 11  mmHg, PA pressure was 46/30 with a mean of 38 mmHg.  Pulmonary wedge pressure was 28 mmHg, cardiac output was 4.62 and cardiac index was 2.21.  Recommendations: Recommend medical therapy for nonobstructive coronary artery disease.  nonischemic cardiomyopathy diagnosed   Seen in CHF clinic November 25, 2017 Notes indicating still a small amount of smoking and small amount of liquor daily Tested positive for cocaine November 13, 2017   By his report Weight after hospital 186 Weight at home  179 to 180 In the office today 174 Suspect his scale does not match our scale  He reports having some continued shortness of breath on exertion Does not feel that he is ready to go back to work At work has to walk [name] distances and will give out  EKG personally reviewed by myself on todays visit Shows normal sinus rhythm rate 92 bpm nonspecific T wave abnormality  other past medical history reviewed CHF clinic September 16, 2017 Diuresed greater than 10 L on admission November 2018    PMH:   has a past medical history of Acid reflux, Asthma, Chronic systolic CHF (congestive heart failure) (HCC), Hypertension, Noncompliance, Obesity, and Polysubstance abuse (HCC).  PSH:    Past Surgical History:  Procedure Laterality Date  . COLONOSCOPY    . ESOPHAGOGASTRODUODENOSCOPY    . RIGHT/LEFT HEART CATH AND CORONARY ANGIOGRAPHY N/A 11/19/2017   Procedure: RIGHT/LEFT HEART CATH AND CORONARY ANGIOGRAPHY;  Surgeon: Iran Ouch, MD;  Location: [facility] INVASIVE CV LAB;  Service: Cardiovascular;  Laterality: N/A;    Current Outpatient Medications  Medication Sig Dispense Refill  . aspirin 81 MG EC  tablet Take 1 tablet (81 mg total) by mouth daily. 30 tablet 3  . atorvastatin (LIPITOR) 40 MG tablet Take 1 tablet (40 mg total) by mouth daily. 30 tablet 3  . carvedilol (COREG) 6.25 MG tablet Take 1 tablet (6.25 mg total) by mouth 2 (two) times daily. 60 tablet 3  . folic acid (FOLVITE) 1 MG tablet Take 1  tablet (1 mg total) by mouth daily. 30 tablet 0  . furosemide (LASIX) 40 MG tablet Take 1 tablet (40 mg total) by mouth 2 (two) times daily. 6 tablet 3  . levothyroxine (SYNTHROID, LEVOTHROID) 50 MCG tablet Take 1 tablet (50 mcg total) by mouth daily before breakfast. 30 tablet 3  . magnesium oxide (MAG-OX) 400 (241.3 Mg) MG tablet Take 1 tablet (400 mg total) by mouth daily. 30 tablet 3  . potassium chloride SA (K-DUR,KLOR-CON) 20 MEQ tablet Take 1 tablet (20 mEq total) by mouth 2 (two) times daily. 60 tablet 3  . sacubitril-valsartan (ENTRESTO) 24-26 MG Take 1 tablet by mouth 2 (two) times daily. 60 tablet 3   No current facility-administered medications for this visit.      Allergies:   Patient has no known allergies.   Social History:  The patient  reports that he has been smoking.  He has a 7.50 pack-year smoking history. he has never used smokeless tobacco. He reports that he drinks alcohol. He reports that he uses drugs. Drug: Cocaine.   Family History:   family history includes Hypertension in his mother.    Review of Systems: Review of Systems  Constitutional: Negative.   Respiratory: Positive for shortness of breath.   Cardiovascular: Negative.   Gastrointestinal: Negative.   Musculoskeletal: Negative.   Neurological: Negative.   Psychiatric/Behavioral: Negative.   All other systems reviewed and are negative.    PHYSICAL EXAM: VS:  BP 112/80 (BP Location: Left Arm, Patient Position: Sitting, Cuff Size: Normal)   Pulse 92   Ht 5\' 10"  (1.778 m)   Wt 174 lb (78.9 kg)   BMI 24.97 kg/m  , BMI Body mass index is 24.97 kg/m. Constitutional:  oriented to person, place, and time. No distress.  HENT:  Head: Normocephalic and atraumatic.  Eyes:  no discharge. No scleral icterus.  Neck: Normal range of motion. Neck supple. No JVD present.  Cardiovascular: Normal rate, regular rhythm, normal heart sounds and intact distal pulses. Exam reveals no gallop and no friction rub. No  edema No murmur heard. Pulmonary/Chest: Effort normal and breath sounds normal. No stridor. No respiratory distress.  no wheezes.  no rales.  no tenderness.  Abdominal: Soft.  no distension.  no tenderness.  Musculoskeletal: Normal range of motion.  no  tenderness or deformity.  Neurological:  normal muscle tone. Coordination normal. No atrophy Skin: Skin is warm and dry. No rash noted. not diaphoretic.  Psychiatric:  normal mood and affect. behavior is normal. Thought content normal.     Recent Labs: 11/13/2017: B Natriuretic Peptide >4,500.0; TSH 7.739 11/17/2017: ALT 21 11/20/2017: BUN 38; Creatinine, Ser 1.04; Hemoglobin 13.0; Magnesium 1.8; Platelets 121; Potassium 3.7; Sodium 140    Lipid Panel Lab Results  Component Value Date   CHOL 146 11/14/2017   HDL 78 11/14/2017   LDLCALC 55 11/14/2017   TRIG 65 11/14/2017      Wt Readings from Last 3 Encounters:  12/07/17 174 lb (78.9 kg)  11/25/17 180 lb 3.2 oz (81.7 kg)  11/21/17 193 lb (87.5 kg)       ASSESSMENT  AND PLAN:  Essential hypertension Blood pressure is well controlled on today's visit. No changes made to the medications.  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 He will continue to work on this and does not want any assistance with chantix.   Cocaine abuse (HCC) Stressed importance of cocaine cessation  Nonischemic cardiomyopathy (HCC) - Plan: EKG 12-Lead Continue current medications, stressed medication  compliance, no alcohol, no cocaine   Disposition:   F/U   1 month   Total encounter time more than 25 minutes  Greater than 50% was spent in counseling and coordination of care with the patient    Orders Placed This Encounter  Procedures  . EKG 12-Lead     Signed, Dossie Arbour, M.D., Ph.D. 12/07/2017  [facility] [place], [place] [phone]

## 2017-12-07 ENCOUNTER — Ambulatory Visit (INDEPENDENT_AMBULATORY_CARE_PROVIDER_SITE_OTHER): Payer: BLUE CROSS/BLUE SHIELD | Admitting: Cardiovascular Disease

## 2017-12-07 ENCOUNTER — Encounter: Payer: Self-pay | Admitting: Cardiovascular Disease

## 2017-12-07 ENCOUNTER — Encounter: Payer: Self-pay | Admitting: *Deleted

## 2017-12-07 VITALS — BP 112/80 | HR 92 | Ht 70.0 in | Wt 174.0 lb

## 2017-12-07 DIAGNOSIS — F1410 Cocaine abuse, uncomplicated: Secondary | ICD-10-CM

## 2017-12-07 DIAGNOSIS — I428 Other cardiomyopathies: Secondary | ICD-10-CM | POA: Diagnosis not present

## 2017-12-07 DIAGNOSIS — F1910 Other psychoactive substance abuse, uncomplicated: Secondary | ICD-10-CM

## 2017-12-07 DIAGNOSIS — I5022 Chronic systolic (congestive) heart failure: Secondary | ICD-10-CM | POA: Diagnosis not present

## 2017-12-07 NOTE — Patient Instructions (Addendum)
Medication Instructions:   No medication changes made  Labwork:  No new labs needed  Testing/Procedures:  No further testing at this time   Follow-Up: It was a pleasure seeing you in the office today. Please call us if you have new issues that need to be addressed before your next appt.  [phone]  Your physician wants you to follow-up in: end of March with Dr. Mariah Milling.  You will receive a reminder letter in the mail two months in advance. If you don't receive a letter, please call our office to schedule the follow-up appointment.  If you need a refill on your cardiac medications before your next appointment, please call your pharmacy.  For educational health videos Log in to : [email or web address] Or : [email or web address], password : [email or web address]

## 2017-12-09 ENCOUNTER — Ambulatory Visit: Payer: BLUE CROSS/BLUE SHIELD | Admitting: Cardiovascular Disease

## 2017-12-15 NOTE — Telephone Encounter (Signed)
Gave forms sent from [employer] to Bernette Mayers, RN .

## 2017-12-17 ENCOUNTER — Telehealth: Payer: Self-pay | Admitting: Cardiovascular Disease

## 2017-12-17 NOTE — Telephone Encounter (Signed)
Mailed to [employer] via interoffice mail

## 2017-12-17 NOTE — Telephone Encounter (Signed)
[employer] forms completed and given to Sabrina.  

## 2017-12-21 ENCOUNTER — Ambulatory Visit: Payer: Self-pay | Admitting: Orthopedic Surgery

## 2017-12-22 NOTE — Progress Notes (Deleted)
Patient ID: Marcus Foster, male    DOB: [birth year], 59 y.o.   MRN: [id number]  HPI  Marcus Foster is a 57 y/o male with a history of asthma, HTN, current tobacco/etoh use, prior cocaine use and chronic heart failure.   Echo report from 09/07/17 reviewed and showed an EF of 20-25% along with moderate Marcus. Cardiac catheterization done 11/19/17 showed mild/moderate nonobstructive CAD with heavily calcified vessels. Severely reduced EF of 15% along with a PA pressure of 38 mm Hg.   Admitted 11/13/17 due to acute on chronic HF. Cardiology consult obtained. Initially needed IV diuretics and lost ~ 15 liters of fluid. Discharged after 8 days.  Admitted 09/06/17 due to newly diagnosed HF most likely related to chronic cocaine/ETOH abuse. Initially needed IV lasix with transition to oral diuretics. Urine output was >10 L during the admission. Medications were adjusted and he was discharged home after 3 days.  He presents today for a follow-up visit with a chief complaint of   Past Medical History:  Diagnosis Date  . Acid reflux   . Asthma   . Chronic systolic CHF (congestive heart failure) (HCC)    a. TTE 08/2017: EF 20-25%, moderate LVH, unable to exclude RWMA, mild to moderate Marcus, moderately dilated LV, mildly dilated RV with moderately reduced RVSF, mildly dilated RA, trivial pericardial effusion  . Hypertension   . Noncompliance   . Obesity   . Polysubstance abuse (HCC)    a. ongoing cocaine, tobacco, and alcohol abuse   Past Surgical History:  Procedure Laterality Date  . COLONOSCOPY    . ESOPHAGOGASTRODUODENOSCOPY    . RIGHT/LEFT HEART CATH AND CORONARY ANGIOGRAPHY N/A 11/19/2017   Procedure: RIGHT/LEFT HEART CATH AND CORONARY ANGIOGRAPHY;  Surgeon: Iran Ouch, MD;  Location: [facility] INVASIVE CV LAB;  Service: Cardiovascular;  Laterality: N/A;   Family History  Problem Relation Age of Onset  . Hypertension Mother    Social History   Tobacco Use  . Smoking status: Current Every Day Smoker    Packs/day: 0.25    Years: 30.00    Pack years: 7.50  . Smokeless tobacco: Never Used  Substance Use Topics  . Alcohol use: Yes    Comment: "couple shots a day" and beer   No Known Allergies   Review of Systems  Constitutional: Positive for fatigue. Negative for appetite change and fever.  HENT: Positive for rhinorrhea. Negative for congestion and sore throat.   Eyes: Negative.   Respiratory: Positive for cough. Negative for chest tightness and shortness of breath.   Cardiovascular: Negative for chest pain, palpitations and leg swelling.  Gastrointestinal: Negative for abdominal distention and abdominal pain.  Endocrine: Negative.   Genitourinary: Negative.   Musculoskeletal: Negative for back pain and neck pain.  Skin: Negative.   Allergic/Immunologic: Negative.   Neurological: Negative for dizziness and light-headedness.  Hematological: Negative for adenopathy. Does not bruise/bleed easily.  Psychiatric/Behavioral: Negative for dysphoric mood and sleep disturbance (sleeping on 1-2 pillows). The patient is not nervous/anxious.      Physical Exam  Constitutional: He is oriented to person, place, and time. He appears well-developed and well-nourished.  HENT:  Head: Normocephalic and atraumatic.  Neck: Normal range of motion. Neck supple. No JVD present.  Cardiovascular: Regular rhythm. Tachycardia present.  Pulmonary/Chest: Effort normal. He has no wheezes. He has no rales.  Abdominal: Soft. He exhibits no distension. There is no tenderness.  Musculoskeletal: He exhibits no edema or tenderness.  Neurological: He is alert and oriented to  person, place, and time.  Skin: Skin is warm and dry.  Psychiatric: He has a normal mood and affect. His behavior is normal. Thought content normal.  Nursing note and vitals reviewed.  Assessment & Plan:  1: Chronic heart failure with reduced ejection fraction- - NYHA class II - euvolemic today - weighing daily. He was instructed to call  for an overnight weight gain of >2 pounds or a weekly weight gain of >5 pounds - weight down 40 pounds since 10/27/17 - does add "some" salt to his food. Reminded to closely follow a 2000mg  sodium diet - reports receiving his flu vaccine for this season already - saw cardiologist Mariah Milling) 12/07/17 - consider increasing entresto and/or carvedilol in the future  2: HTN- - sees PCP Clint Guy)  - BP looks good today - BMP from 11/20/17 reviewed and showed sodium 140, potassium 3.7 and GFR >60  3: Substance abuse- - currently smoking 1 cigarette daily along with 1 shot of liquor daily (down from 1 pint daily) - says that he last used cocaine on 11/10/17; tested positive on 11/13/17 - complete cessation of all above substances discussed for 3 minutes with him - explained that now that he's on carvedilol, his risk of an MI may be increased if he continues to use cocaine  4: Lymphedema- - stage 2 - no longer has any edema  Medication bottles were reviewed.

## 2017-12-23 ENCOUNTER — Ambulatory Visit: Payer: BLUE CROSS/BLUE SHIELD | Admitting: Family

## 2017-12-23 ENCOUNTER — Telehealth: Payer: Self-pay | Admitting: Family

## 2017-12-23 NOTE — Telephone Encounter (Signed)
Patient did not show for his Heart Failure Clinic appointment on 12/23/17. Will attempt to reschedule.

## 2018-01-01 NOTE — Progress Notes (Deleted)
Cardiology Office Note  Date:  01/01/2018   ID:  Marcus Foster, DOB [birth year], MRN [id number]  PCP:  Armando Gang, FNP   No chief complaint on file.   HPI:  Marcus Foster a57 y.o.malewith a history of childhood asthma,  esophageal stricture, dysphagia, diverticulosis,  heavy alcohol abuse,  chronic tobacco smoking abuse,  chronic cocaine abuse, recent abuse Presenting to the hospital November 2018 with worsening cough, shortness of breath, dyspnea on exertion,  alcohol level 270 Diagnosed with dilated cardiomyopathy ejection fraction 25-30% felt secondary to alcohol abuse, chronic hypertension [name] history of noncompliance, missing his Lasix on weekends High salt diet Presenting for routine follow-up of his cardiomyopathy, moderate mitral valve regurg   In the hospital 11/2017 CHF Hospital records reviewed with the patient in detail admitted 2/1 with acute on chronic systolic CHF in the setting of noncompliance, ETOH abuse. Has severe leg and scrotal edema, shortness of breath Was not taking lasix 40 BID  Studies done in the hospital Echo 08/2017 EF 20 to 25%, RV mod reduced systolic function  Cardiac cath 11/19/2017  Prox LAD lesion is 60% stenosed.  Dist LAD lesion is 40% stenosed.  Mid RCA lesion is 40% stenosed.  There is severe left ventricular systolic dysfunction.  LV end diastolic pressure is severely elevated.   1.  Mild to moderate nonobstructive coronary artery disease with heavily calcified vessels.  The worst stenosis is 60% in the proximal LAD. 2.  Severely reduced LV systolic function with an EF of 15% and global hypokinesis.   3.  Right heart catheterization showed moderately to severely elevated filling pressures, moderate pulmonary hypertension and mildly reduced cardiac output.  RA pressure: 4 mmHg, RV pressure is 51 over 11 mmHg, PA pressure was 46/30 with a mean of 38 mmHg.  Pulmonary wedge pressure was 28 mmHg, cardiac output was 4.62 and  cardiac index was 2.21.  Recommendations: Recommend medical therapy for nonobstructive coronary artery disease.  nonischemic cardiomyopathy diagnosed   Seen in CHF clinic November 25, 2017 Notes indicating still a small amount of smoking and small amount of liquor daily Tested positive for cocaine November 13, 2017   By his report Weight after hospital 186 Weight at home  179 to 180 In the office today 174 Suspect his scale does not match our scale  He reports having some continued shortness of breath on exertion Does not feel that he is ready to go back to work At work has to walk [place] distances and will give out  EKG personally reviewed by myself on todays visit Shows normal sinus rhythm rate 92 bpm nonspecific T wave abnormality  other past medical history reviewed CHF clinic September 16, 2017 Diuresed greater than 10 L on admission November 2018    PMH:   has a past medical history of Acid reflux, Asthma, Chronic systolic CHF (congestive heart failure) (HCC), Hypertension, Noncompliance, Obesity, and Polysubstance abuse (HCC).  PSH:    Past Surgical History:  Procedure Laterality Date  . COLONOSCOPY    . ESOPHAGOGASTRODUODENOSCOPY    . RIGHT/LEFT HEART CATH AND CORONARY ANGIOGRAPHY N/A 11/19/2017   Procedure: RIGHT/LEFT HEART CATH AND CORONARY ANGIOGRAPHY;  Surgeon: Iran Ouch, MD;  Location: [facility] INVASIVE CV LAB;  Service: Cardiovascular;  Laterality: N/A;    Current Outpatient Medications  Medication Sig Dispense Refill  . aspirin 81 MG EC tablet Take 1 tablet (81 mg total) by mouth daily. 30 tablet 3  . atorvastatin (LIPITOR) 40 MG tablet Take 1 tablet (40  mg total) by mouth daily. 30 tablet 3  . carvedilol (COREG) 6.25 MG tablet Take 1 tablet (6.25 mg total) by mouth 2 (two) times daily. 60 tablet 3  . folic acid (FOLVITE) 1 MG tablet Take 1 tablet (1 mg total) by mouth daily. 30 tablet 0  . furosemide (LASIX) 40 MG tablet Take 1 tablet (40 mg total) by mouth  2 (two) times daily. 6 tablet 3  . levothyroxine (SYNTHROID, LEVOTHROID) 50 MCG tablet Take 1 tablet (50 mcg total) by mouth daily before breakfast. 30 tablet 3  . magnesium oxide (MAG-OX) 400 (241.3 Mg) MG tablet Take 1 tablet (400 mg total) by mouth daily. 30 tablet 3  . potassium chloride SA (K-DUR,KLOR-CON) 20 MEQ tablet Take 1 tablet (20 mEq total) by mouth 2 (two) times daily. 60 tablet 3  . sacubitril-valsartan (ENTRESTO) 24-26 MG Take 1 tablet by mouth 2 (two) times daily. 60 tablet 3   No current facility-administered medications for this visit.      Allergies:   Patient has no known allergies.   Social History:  The patient  reports that he has been smoking.  He has a 7.50 pack-year smoking history. He has never used smokeless tobacco. He reports that he drinks alcohol. He reports that he has current or past drug history. Drug: Cocaine.   Family History:   family history includes Hypertension in his mother.    Review of Systems: Review of Systems  Constitutional: Negative.   Respiratory: Positive for shortness of breath.   Cardiovascular: Negative.   Gastrointestinal: Negative.   Musculoskeletal: Negative.   Neurological: Negative.   Psychiatric/Behavioral: Negative.   All other systems reviewed and are negative.    PHYSICAL EXAM: VS:  There were no vitals taken for this visit. , BMI There is no height or weight on file to calculate BMI. Constitutional:  oriented to person, place, and time. No distress.  HENT:  Head: Normocephalic and atraumatic.  Eyes:  no discharge. No scleral icterus.  Neck: Normal range of motion. Neck supple. No JVD present.  Cardiovascular: Normal rate, regular rhythm, normal heart sounds and intact distal pulses. Exam reveals no gallop and no friction rub. No edema No murmur heard. Pulmonary/Chest: Effort normal and breath sounds normal. No stridor. No respiratory distress.  no wheezes.  no rales.  no tenderness.  Abdominal: Soft.  no  distension.  no tenderness.  Musculoskeletal: Normal range of motion.  no  tenderness or deformity.  Neurological:  normal muscle tone. Coordination normal. No atrophy Skin: Skin is warm and dry. No rash noted. not diaphoretic.  Psychiatric:  normal mood and affect. behavior is normal. Thought content normal.     Recent Labs: 11/13/2017: B Natriuretic Peptide >4,500.0; TSH 7.739 11/17/2017: ALT 21 11/20/2017: BUN 38; Creatinine, Ser 1.04; Hemoglobin 13.0; Magnesium 1.8; Platelets 121; Potassium 3.7; Sodium 140    Lipid Panel Lab Results  Component Value Date   CHOL 146 11/14/2017   HDL 78 11/14/2017   LDLCALC 55 11/14/2017   TRIG 65 11/14/2017      Wt Readings from Last 3 Encounters:  12/07/17 174 lb (78.9 kg)  11/25/17 180 lb 3.2 oz (81.7 kg)  11/21/17 193 lb (87.5 kg)       ASSESSMENT AND PLAN:  Essential hypertension Blood pressure is well controlled on today's visit. No changes made to the medications.  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 He will continue to work on this and does not want any assistance with chantix.   Cocaine abuse (HCC) Stressed importance of cocaine cessation  Nonischemic cardiomyopathy (HCC) - Plan: EKG 12-Lead Continue current medications, stressed medication compliance, no alcohol, no cocaine   Disposition:   F/U   1 month   Total encounter time more than 25 minutes  Greater than 50% was spent in counseling and coordination of care  with the patient    No orders of the defined types were placed in this encounter.    Signed, Dossie Arbour, M.D., Ph.D. 01/01/2018  [facility] [place], [place] [phone]

## 2018-01-04 ENCOUNTER — Ambulatory Visit: Payer: BLUE CROSS/BLUE SHIELD | Admitting: Cardiovascular Disease

## 2018-01-04 DIAGNOSIS — R0989 Other specified symptoms and signs involving the circulatory and respiratory systems: Secondary | ICD-10-CM

## 2018-01-05 ENCOUNTER — Encounter: Payer: Self-pay | Admitting: Cardiovascular Disease

## 2018-01-06 ENCOUNTER — Inpatient Hospital Stay: Admission: RE | Admit: 2018-01-06 | Payer: BLUE CROSS/BLUE SHIELD | Source: Ambulatory Visit

## 2018-01-13 ENCOUNTER — Ambulatory Visit: Payer: BLUE CROSS/BLUE SHIELD | Admitting: Family

## 2018-01-13 ENCOUNTER — Telehealth: Payer: Self-pay | Admitting: Family

## 2018-01-13 ENCOUNTER — Encounter
Admission: RE | Admit: 2018-01-13 | Discharge: 2018-01-13 | Disposition: A | Discharge status: Home / Self Care | Payer: BLUE CROSS/BLUE SHIELD | Source: Ambulatory Visit | Attending: Orthopedic Surgery | Admitting: Orthopedic Surgery

## 2018-01-13 ENCOUNTER — Other Ambulatory Visit: Payer: Self-pay

## 2018-01-13 DIAGNOSIS — Z0183 Encounter for blood typing: Secondary | ICD-10-CM | POA: Diagnosis not present

## 2018-01-13 DIAGNOSIS — Z01812 Encounter for preprocedural laboratory examination: Secondary | ICD-10-CM | POA: Insufficient documentation

## 2018-01-13 LAB — TYPE AND SCREEN
ABO/RH(D): O NEG
ANTIBODY SCREEN: NEGATIVE

## 2018-01-13 LAB — BASIC METABOLIC PANEL
Anion gap: 9 (ref 5–15)
BUN: 32 mg/dL — AB (ref 6–20)
CHLORIDE: 104 mmol/L (ref 101–111)
CO2: 26 mmol/L (ref 22–32)
CREATININE: 1.23 mg/dL (ref 0.61–1.24)
Calcium: 9.7 mg/dL (ref 8.9–10.3)
GFR calc non Af Amer: >60 mL/min (ref >60)
Glucose, Bld: 113 mg/dL — ABNORMAL HIGH (ref 65–99)
Potassium: 4.7 mmol/L (ref 3.5–5.1)
Sodium: 139 mmol/L (ref 135–145)

## 2018-01-13 LAB — CBC
HCT: 37.9 % — ABNORMAL LOW (ref 40.0–52.0)
Hemoglobin: 12.2 g/dL — ABNORMAL LOW (ref 13.0–18.0)
MCH: 31.2 pg (ref 26.0–34.0)
MCHC: 32.2 g/dL (ref 32.0–36.0)
MCV: 96.7 fL (ref 80.0–100.0)
PLATELETS: 195 10*3/uL (ref 150–440)
RBC: 3.92 MIL/uL — AB (ref 4.40–5.90)
RDW: 14 % (ref 11.5–14.5)
WBC: 4.8 10*3/uL (ref 3.8–10.6)

## 2018-01-13 LAB — URINALYSIS, ROUTINE W REFLEX MICROSCOPIC
BILIRUBIN URINE: NEGATIVE
Glucose, UA: NEGATIVE mg/dL
Hgb urine dipstick: NEGATIVE
Ketones, ur: NEGATIVE mg/dL
Leukocytes, UA: NEGATIVE
Nitrite: NEGATIVE
PH: 5 (ref 5.0–8.0)
Protein, ur: NEGATIVE mg/dL
SPECIFIC GRAVITY, URINE: 1.009 (ref 1.005–1.030)

## 2018-01-13 LAB — SURGICAL PCR SCREEN
MRSA, PCR: NEGATIVE
Staphylococcus aureus: NEGATIVE

## 2018-01-13 LAB — PROTIME-INR
INR: 0.96
Prothrombin Time: 12.7 seconds (ref 11.4–15.2)

## 2018-01-13 LAB — APTT: aPTT: 33 seconds (ref 24–36)

## 2018-01-13 NOTE — Pre-Procedure Instructions (Signed)
Last cardiologist visit 12/06/17.

## 2018-01-13 NOTE — Telephone Encounter (Signed)
Patient did not show for his Heart Failure Clinic appointment on 01/13/18. Will attempt to reschedule.  

## 2018-01-13 NOTE — Patient Instructions (Signed)
Your procedure is scheduled on: Monday 01/18/18 Report to [place]. To find out your arrival time please call [phone] between 1PM - 3PM on Friday 01/15/18.  Remember: Instructions that are not followed completely may result in serious medical risk, up to and including death, or upon the discretion of your surgeon and anesthesiologist your surgery may need to be rescheduled.     _X__ 1. Do not eat food after midnight the night before your procedure.                 No gum chewing or hard candies. You may drink clear liquids up to 2 hours                 before you are scheduled to arrive for your surgery- DO not drink clear                 liquids within 2 hours of the start of your surgery.                 Clear Liquids include:  water, apple juice without pulp, clear carbohydrate                 drink such as Clearfast or Gatorade, Black Coffee or Tea (Do not add                 anything to coffee or tea).  __X__2.  On the morning of surgery brush your teeth with toothpaste and water, you                 may rinse your mouth with mouthwash if you wish.  Do not swallow any              toothpaste of mouthwash.     _X__ 3.  No Alcohol for 24 hours before or after surgery.   _X__ 4.  Do Not Smoke or use e-cigarettes For 24 Hours Prior to Your Surgery.                 Do not use any chewable tobacco products for at least 6 hours prior to                 surgery.  ____  5.  Bring all medications with you on the day of surgery if instructed.   __X__  6.  Notify your doctor if there is any change in your medical condition      (cold, fever, infections).     Do not wear jewelry, make-up, hairpins, clips or nail polish. Do not wear lotions, powders, or perfumes.  Do not shave 48 hours prior to surgery. Men may shave face and neck. Do not bring valuables to the hospital.    [facility] is not responsible for any belongings or  valuables.  Contacts, dentures/partials or body piercings may not be worn into surgery. Bring a case for your contacts, glasses or hearing aids, a denture cup will be supplied. Leave your suitcase in the car. After surgery it may be brought to your room. For patients admitted to the hospital, discharge time is determined by your treatment team.   Patients discharged the day of surgery will not be allowed to drive home.   Please read over the following fact sheets that you were given:   MRSA Information  __X__ Take these medicines the morning of surgery with A SIP OF WATER:  1. Atorvastatin  2. Carvedilol  3. Levothyroxine  4.  5.  6.  ____ Fleet Enema (as directed)   __X__ Use CHG Soap/SAGE wipes as directed  ____ Use inhalers on the day of surgery  ____ Stop metformin/Janumet/Farxiga 2 days prior to surgery    ____ Take 1/2 of usual insulin dose the night before surgery. No insulin the morning          of surgery.   __X__ Stop Blood Thinners Coumadin/Plavix/Xarelto/Pleta/Pradaxa/Eliquis/Effient/Aspirin  on   Or contact your Surgeon, Cardiologist or Medical Doctor regarding  ability to stop your blood thinners  __X__ Stop Anti-inflammatories 7 days before surgery such as Advil, Ibuprofen, Motrin,  BC or Goodies Powder, Naprosyn, Naproxen, Aleve, Aspirin    __X__ Stopall herbal supplements, fish oil or vitamin E until after surgery.    ____ Bring C-Pap to the hospital.

## 2018-01-14 NOTE — Progress Notes (Deleted)
Patient ID: Marcus Foster, male    DOB: [birth year], 59 y.o.   MRN: [id number]  HPI  Mr Lundwall is a 57 y/o male with a history of asthma, HTN, current tobacco/etoh use, prior cocaine use and chronic heart failure.   Echo report from 09/07/17 reviewed and showed an EF of 20-25% along with moderate MR. Cardiac catheterization done 11/19/17 showed mild/moderate nonobstructive CAD with heavily calcified vessels. Severely reduced EF of 15% along with a PA pressure of 38 mm Hg.   Admitted 11/13/17 due to acute on chronic HF. Cardiology consult obtained. Initially needed IV diuretics and lost ~ 15 liters of fluid. Discharged after 8 days.  Admitted 09/06/17 due to newly diagnosed HF most likely related to chronic cocaine/ETOH abuse. Initially needed IV lasix with transition to oral diuretics. Urine output was >10 L during the admission. Medications were adjusted and he was discharged home after 3 days.  He presents today for a follow-up visit with a chief complaint of   Past Medical History:  Diagnosis Date  . Acid reflux   . Asthma   . Chronic systolic CHF (congestive heart failure) (HCC)    a. TTE 08/2017: EF 20-25%, moderate LVH, unable to exclude RWMA, mild to moderate MR, moderately dilated LV, mildly dilated RV with moderately reduced RVSF, mildly dilated RA, trivial pericardial effusion  . Hypertension   . Noncompliance   . Obesity   . Polysubstance abuse (HCC)    a. ongoing cocaine, tobacco, and alcohol abuse   Past Surgical History:  Procedure Laterality Date  . COLONOSCOPY    . ESOPHAGOGASTRODUODENOSCOPY    . RIGHT/LEFT HEART CATH AND CORONARY ANGIOGRAPHY N/A 11/19/2017   Procedure: RIGHT/LEFT HEART CATH AND CORONARY ANGIOGRAPHY;  Surgeon: Iran Ouch, MD;  Location: [facility] INVASIVE CV LAB;  Service: Cardiovascular;  Laterality: N/A;   Family History  Problem Relation Age of Onset  . Hypertension Mother    Social History   Tobacco Use  . Smoking status: Current Every Day Smoker    Packs/day: 0.25    Years: 30.00    Pack years: 7.50  . Smokeless tobacco: Never Used  Substance Use Topics  . Alcohol use: Yes    Comment: "couple shots a day" and beer   No Known Allergies   Review of Systems  Constitutional: Positive for fatigue. Negative for appetite change and fever.  HENT: Positive for rhinorrhea. Negative for congestion and sore throat.   Eyes: Negative.   Respiratory: Positive for cough. Negative for chest tightness and shortness of breath.   Cardiovascular: Negative for chest pain, palpitations and leg swelling.  Gastrointestinal: Negative for abdominal distention and abdominal pain.  Endocrine: Negative.   Genitourinary: Negative.   Musculoskeletal: Negative for back pain and neck pain.  Skin: Negative.   Allergic/Immunologic: Negative.   Neurological: Negative for dizziness and light-headedness.  Hematological: Negative for adenopathy. Does not bruise/bleed easily.  Psychiatric/Behavioral: Negative for dysphoric mood and sleep disturbance (sleeping on 1-2 pillows). The patient is not nervous/anxious.      Physical Exam  Constitutional: He is oriented to person, place, and time. He appears well-developed and well-nourished.  HENT:  Head: Normocephalic and atraumatic.  Neck: Normal range of motion. Neck supple. No JVD present.  Cardiovascular: Regular rhythm. Tachycardia present.  Pulmonary/Chest: Effort normal. He has no wheezes. He has no rales.  Abdominal: Soft. He exhibits no distension. There is no tenderness.  Musculoskeletal: He exhibits no edema or tenderness.  Neurological: He is alert and oriented to  person, place, and time.  Skin: Skin is warm and dry.  Psychiatric: He has a normal mood and affect. His behavior is normal. Thought content normal.  Nursing note and vitals reviewed.  Assessment & Plan:  1: Chronic heart failure with reduced ejection fraction- - NYHA class II - euvolemic today - weighing daily. He was instructed to call  for an overnight weight gain of >2 pounds or a weekly weight gain of >5 pounds - weight down 40 pounds since 10/27/17 - does add "some" salt to his food. Reminded to closely follow a 2000mg  sodium diet - reports receiving his flu vaccine for this season already - saw cardiologist Mariah Milling) 12/07/17 - consider increasing entresto and/or carvedilol in the future  2: HTN- - sees PCP Clint Guy)  - BP looks good today - BMP from 11/20/17 reviewed and showed sodium 140, potassium 3.7 and GFR >60  3: Substance abuse- - currently smoking 1 cigarette daily along with 1 shot of liquor daily (down from 1 pint daily) - says that he last used cocaine on 11/10/17; tested positive on 11/13/17 - complete cessation of all above substances discussed for 3 minutes with him - explained that now that he's on carvedilol, his risk of an MI may be increased if he continues to use cocaine  4: Lymphedema- - stage 2 - no longer has any edema  Medication bottles were reviewed.

## 2018-01-15 ENCOUNTER — Ambulatory Visit: Payer: BLUE CROSS/BLUE SHIELD | Admitting: Family

## 2018-01-15 ENCOUNTER — Telehealth: Payer: Self-pay | Admitting: Family

## 2018-01-15 NOTE — Telephone Encounter (Signed)
Patient did not show for his Heart Failure Clinic appointment on 01/15/18. Will attempt to reschedule.   This is the 3rd appointment that he has missed and the 2nd one just this week.

## 2018-01-17 MED ORDER — TRANEXAMIC ACID 1000 MG/10ML IV SOLN
1000.0000 mg | INTRAVENOUS | Status: DC
  Filled 2018-01-17: qty 10

## 2018-01-17 MED ORDER — CEFAZOLIN SODIUM-DEXTROSE 2-4 GM/100ML-% IV SOLN
2.0000 g | INTRAVENOUS | Status: DC
  Filled 2018-01-17: qty 100

## 2018-01-18 ENCOUNTER — Encounter
Admission: RE | Disposition: A | Discharge status: Home / Self Care | Payer: Self-pay | Source: Ambulatory Visit | Attending: Orthopedic Surgery

## 2018-01-18 ENCOUNTER — Inpatient Hospital Stay: Payer: BLUE CROSS/BLUE SHIELD | Admitting: Anesthesiology

## 2018-01-18 ENCOUNTER — Ambulatory Visit
Admission: RE | Admit: 2018-01-18 | Discharge: 2018-01-18 | Disposition: A | Discharge status: Home / Self Care | Payer: BLUE CROSS/BLUE SHIELD | Source: Ambulatory Visit | Attending: Orthopedic Surgery | Admitting: Orthopedic Surgery

## 2018-01-18 DIAGNOSIS — M1611 Unilateral primary osteoarthritis, right hip: Secondary | ICD-10-CM | POA: Diagnosis present

## 2018-01-18 DIAGNOSIS — Z538 Procedure and treatment not carried out for other reasons: Secondary | ICD-10-CM | POA: Insufficient documentation

## 2018-01-18 DIAGNOSIS — F14929 Cocaine use, unspecified with intoxication, unspecified: Secondary | ICD-10-CM | POA: Diagnosis not present

## 2018-01-18 DIAGNOSIS — Z419 Encounter for procedure for purposes other than remedying health state, unspecified: Secondary | ICD-10-CM

## 2018-01-18 LAB — URINE DRUG SCREEN, QUALITATIVE (ARMC ONLY)
Amphetamines, Ur Screen: NOT DETECTED
Barbiturates, Ur Screen: NOT DETECTED
Benzodiazepine, Ur Scrn: NOT DETECTED
COCAINE METABOLITE, UR ~~LOC~~: POSITIVE — AB
Cannabinoid 50 Ng, Ur ~~LOC~~: NOT DETECTED
MDMA (ECSTASY) UR SCREEN: NOT DETECTED
Methadone Scn, Ur: NOT DETECTED
Opiate, Ur Screen: NOT DETECTED
Phencyclidine (PCP) Ur S: NOT DETECTED
TRICYCLIC, UR SCREEN: NOT DETECTED

## 2018-01-18 LAB — ABO/RH: ABO/RH(D): O NEG

## 2018-01-18 SURGERY — ARTHROPLASTY, HIP, TOTAL, ANTERIOR APPROACH
Anesthesia: Spinal | Laterality: Right

## 2018-01-18 MED ORDER — FAMOTIDINE 20 MG PO TABS
20.0000 mg | ORAL_TABLET | Freq: Once | ORAL | Status: AC
  Administered 2018-01-18: 20 mg via ORAL

## 2018-01-18 MED ORDER — ACETAMINOPHEN 500 MG PO TABS
ORAL_TABLET | ORAL | Status: AC
  Filled 2018-01-18: qty 2

## 2018-01-18 MED ORDER — GABAPENTIN 300 MG PO CAPS
ORAL_CAPSULE | ORAL | Status: DC
Start: 2018-01-18 — End: 2018-01-18
  Filled 2018-01-18: qty 1

## 2018-01-18 MED ORDER — CHLORHEXIDINE GLUCONATE 4 % EX LIQD: 60.0000 mL | Freq: Once | CUTANEOUS | Status: DC

## 2018-01-18 MED ORDER — CEFAZOLIN SODIUM-DEXTROSE 2-3 GM-%(50ML) IV SOLR
INTRAVENOUS | Status: AC
  Filled 2018-01-18: qty 50

## 2018-01-18 MED ORDER — ACETAMINOPHEN 500 MG PO TABS
1000.0000 mg | ORAL_TABLET | Freq: Once | ORAL | Status: AC
  Administered 2018-01-18: 1000 mg via ORAL

## 2018-01-18 MED ORDER — GABAPENTIN 300 MG PO CAPS
300.0000 mg | ORAL_CAPSULE | Freq: Once | ORAL | Status: AC
  Administered 2018-01-18: 300 mg via ORAL

## 2018-01-18 MED ORDER — BUPIVACAINE HCL (PF) 0.5 % IJ SOLN
INTRAMUSCULAR | Status: AC
  Filled 2018-01-18: qty 10

## 2018-01-18 MED ORDER — FAMOTIDINE 20 MG PO TABS
ORAL_TABLET | ORAL | Status: AC
  Filled 2018-01-18: qty 1

## 2018-01-18 MED ORDER — FENTANYL CITRATE (PF) 100 MCG/2ML IJ SOLN
INTRAMUSCULAR | Status: AC
  Filled 2018-01-18: qty 2

## 2018-01-18 MED ORDER — MIDAZOLAM HCL 2 MG/2ML IJ SOLN
INTRAMUSCULAR | Status: AC
  Filled 2018-01-18: qty 2

## 2018-01-18 MED ORDER — LACTATED RINGERS IV SOLN
INTRAVENOUS | Status: DC
  Administered 2018-01-18: 09:00:00 via INTRAVENOUS

## 2018-01-18 SURGICAL SUPPLY — 40 items
BLADE SAGITTAL WIDE XTHICK NO (BLADE) ×3 IMPLANT
BRUSH SCRUB EZ  4% CHG (MISCELLANEOUS) ×4
BRUSH SCRUB EZ 4% CHG (MISCELLANEOUS) ×2 IMPLANT
CHLORAPREP W/TINT 26ML (MISCELLANEOUS) ×3 IMPLANT
DRAPE C-ARM 42X72 X-RAY (DRAPES) ×3 IMPLANT
DRAPE SHEET LG 3/4 BI-LAMINATE (DRAPES) ×6 IMPLANT
DRAPE STERI IOBAN 125X83 (DRAPES) ×3 IMPLANT
DRSG AQUACEL AG ADV 3.5X10 (GAUZE/BANDAGES/DRESSINGS) ×3 IMPLANT
DRSG AQUACEL AG ADV 3.5X14 (GAUZE/BANDAGES/DRESSINGS) ×3 IMPLANT
ELECT BLADE 6.5 EXT (BLADE) ×3 IMPLANT
ELECT REM PT RETURN 9FT ADLT (ELECTROSURGICAL) ×3
ELECTRODE REM PT RTRN 9FT ADLT (ELECTROSURGICAL) ×1 IMPLANT
GAUZE PETRO XEROFOAM 1X8 (MISCELLANEOUS) ×3 IMPLANT
GLOVE INDICATOR 8.0 STRL GRN (GLOVE) ×3 IMPLANT
GLOVE SURG ORTHO 8.0 STRL STRW (GLOVE) ×3 IMPLANT
GOWN STRL REUS W/ TWL LRG LVL3 (GOWN DISPOSABLE) ×2 IMPLANT
GOWN STRL REUS W/ TWL XL LVL3 (GOWN DISPOSABLE) ×1 IMPLANT
GOWN STRL REUS W/TWL LRG LVL3 (GOWN DISPOSABLE) ×4
GOWN STRL REUS W/TWL XL LVL3 (GOWN DISPOSABLE) ×2
HOOD PEEL AWAY FLYTE STAYCOOL (MISCELLANEOUS) ×9 IMPLANT
IV NS 1000ML (IV SOLUTION) ×2
IV NS 1000ML BAXH (IV SOLUTION) ×1 IMPLANT
KIT PATIENT CARE HANA TABLE (KITS) ×3 IMPLANT
KIT TURNOVER CYSTO (KITS) ×3 IMPLANT
MAT BLUE FLOOR 46X72 FLO (MISCELLANEOUS) ×3 IMPLANT
NDL SAFETY ECLIPSE 18X1.5 (NEEDLE) ×2 IMPLANT
NEEDLE HYPO 18GX1.5 SHARP (NEEDLE) ×4
NEEDLE HYPO 22GX1.5 SAFETY (NEEDLE) ×3 IMPLANT
NEEDLE SPNL 20GX3.5 QUINCKE YW (NEEDLE) ×3 IMPLANT
PACK HIP PROSTHESIS (MISCELLANEOUS) ×3 IMPLANT
PADDING CAST BLEND 4X4 NS (MISCELLANEOUS) ×6 IMPLANT
PILLOW ABDUCTION MEDIUM (MISCELLANEOUS) ×3 IMPLANT
PULSAVAC PLUS IRRIG FAN TIP (DISPOSABLE) ×3
STAPLER SKIN PROX 35W (STAPLE) ×3 IMPLANT
SUT BONE WAX W31G (SUTURE) ×3 IMPLANT
SUT DVC 2 QUILL PDO  T11 36X36 (SUTURE) ×2
SUT DVC 2 QUILL PDO T11 36X36 (SUTURE) ×1 IMPLANT
SUT VIC AB 2-0 CT1 18 (SUTURE) ×3 IMPLANT
SYR 20CC LL (SYRINGE) ×3 IMPLANT
TIP FAN IRRIG PULSAVAC PLUS (DISPOSABLE) ×1 IMPLANT

## 2018-01-18 NOTE — Anesthesia Preprocedure Evaluation (Deleted)
Anesthesia Evaluation  Patient identified by MRN, date of birth, ID band Patient awake    Reviewed: Allergy & Precautions, NPO status , Patient's Chart, lab work & pertinent test results, reviewed documented beta blocker date and time   History of Anesthesia Complications Negative for: history of anesthetic complications  Airway Mallampati: II       Dental  (+) Missing, Chipped, Upper Dentures   Pulmonary asthma , neg sleep apnea, Current Smoker,           Cardiovascular hypertension, Pt. on medications and Pt. on home beta blockers +CHF (hx, no problems now)  (-) Past MI (-) dysrhythmias (-) Valvular Problems/Murmurs     Neuro/Psych neg Seizures    GI/Hepatic Neg liver ROS, GERD  Medicated and Controlled,(+)     substance abuse  alcohol use,   Endo/Other  neg diabetesHypothyroidism   Renal/GU negative Renal ROS     Musculoskeletal   Abdominal   Peds  Hematology   Anesthesia Other Findings   Reproductive/Obstetrics                             Anesthesia Physical Anesthesia Plan  ASA: III  Anesthesia Plan: Spinal   Post-op Pain Management:    Induction:   PONV Risk Score and Plan:   Airway Management Planned:   Additional Equipment:   Intra-op Plan:   Post-operative Plan:   Informed Consent: I have reviewed the patients History and Physical, chart, labs and discussed the procedure including the risks, benefits and alternatives for the proposed anesthesia with the patient or authorized representative who has indicated his/her understanding and acceptance.     Plan Discussed with:   Anesthesia Plan Comments:         Anesthesia Quick Evaluation

## 2018-01-18 NOTE — H&P (Signed)
Patient surgery is cancelled today due to positive urine cocaine test. He is discharged in good condition. He will follow-up with his primary care physician.

## 2018-01-20 NOTE — Progress Notes (Deleted)
Patient ID: Marcus Foster, male    DOB: [birth year], 59 y.o.   MRN: [id number]  HPI  Mr Lundwall is a 57 y/o male with a history of asthma, HTN, current tobacco/etoh use, prior cocaine use and chronic heart failure.   Echo report from 09/07/17 reviewed and showed an EF of 20-25% along with moderate MR. Cardiac catheterization done 11/19/17 showed mild/moderate nonobstructive CAD with heavily calcified vessels. Severely reduced EF of 15% along with a PA pressure of 38 mm Hg.   Admitted 11/13/17 due to acute on chronic HF. Cardiology consult obtained. Initially needed IV diuretics and lost ~ 15 liters of fluid. Discharged after 8 days.  Admitted 09/06/17 due to newly diagnosed HF most likely related to chronic cocaine/ETOH abuse. Initially needed IV lasix with transition to oral diuretics. Urine output was >10 L during the admission. Medications were adjusted and he was discharged home after 3 days.  He presents today for a follow-up visit with a chief complaint of   Past Medical History:  Diagnosis Date  . Acid reflux   . Asthma   . Chronic systolic CHF (congestive heart failure) (HCC)    a. TTE 08/2017: EF 20-25%, moderate LVH, unable to exclude RWMA, mild to moderate MR, moderately dilated LV, mildly dilated RV with moderately reduced RVSF, mildly dilated RA, trivial pericardial effusion  . Hypertension   . Noncompliance   . Obesity   . Polysubstance abuse (HCC)    a. ongoing cocaine, tobacco, and alcohol abuse   Past Surgical History:  Procedure Laterality Date  . COLONOSCOPY    . ESOPHAGOGASTRODUODENOSCOPY    . RIGHT/LEFT HEART CATH AND CORONARY ANGIOGRAPHY N/A 11/19/2017   Procedure: RIGHT/LEFT HEART CATH AND CORONARY ANGIOGRAPHY;  Surgeon: Iran Ouch, MD;  Location: [facility] INVASIVE CV LAB;  Service: Cardiovascular;  Laterality: N/A;   Family History  Problem Relation Age of Onset  . Hypertension Mother    Social History   Tobacco Use  . Smoking status: Current Every Day Smoker    Packs/day: 0.25    Years: 30.00    Pack years: 7.50  . Smokeless tobacco: Never Used  Substance Use Topics  . Alcohol use: Yes    Comment: "couple shots a day" and beer   No Known Allergies   Review of Systems  Constitutional: Positive for fatigue. Negative for appetite change and fever.  HENT: Positive for rhinorrhea. Negative for congestion and sore throat.   Eyes: Negative.   Respiratory: Positive for cough. Negative for chest tightness and shortness of breath.   Cardiovascular: Negative for chest pain, palpitations and leg swelling.  Gastrointestinal: Negative for abdominal distention and abdominal pain.  Endocrine: Negative.   Genitourinary: Negative.   Musculoskeletal: Negative for back pain and neck pain.  Skin: Negative.   Allergic/Immunologic: Negative.   Neurological: Negative for dizziness and light-headedness.  Hematological: Negative for adenopathy. Does not bruise/bleed easily.  Psychiatric/Behavioral: Negative for dysphoric mood and sleep disturbance (sleeping on 1-2 pillows). The patient is not nervous/anxious.      Physical Exam  Constitutional: He is oriented to person, place, and time. He appears well-developed and well-nourished.  HENT:  Head: Normocephalic and atraumatic.  Neck: Normal range of motion. Neck supple. No JVD present.  Cardiovascular: Regular rhythm. Tachycardia present.  Pulmonary/Chest: Effort normal. He has no wheezes. He has no rales.  Abdominal: Soft. He exhibits no distension. There is no tenderness.  Musculoskeletal: He exhibits no edema or tenderness.  Neurological: He is alert and oriented to  person, place, and time.  Skin: Skin is warm and dry.  Psychiatric: He has a normal mood and affect. His behavior is normal. Thought content normal.  Nursing note and vitals reviewed.  Assessment & Plan:  1: Chronic heart failure with reduced ejection fraction- - NYHA class II - euvolemic today - weighing daily. He was instructed to call  for an overnight weight gain of >2 pounds or a weekly weight gain of >5 pounds - weight down 40 pounds since 10/27/17 - does add "some" salt to his food. Reminded to closely follow a 2000mg  sodium diet - reports receiving his flu vaccine for this season already - saw cardiologist Mariah Milling) 12/07/17 - consider increasing entresto and/or carvedilol in the future  2: HTN- - sees PCP Clint Guy)  - BP looks good today - BMP from 01/13/18 reviewed and showed sodium 139, potassium 4.7 and GFR >60  3: Substance abuse- - currently smoking 1 cigarette daily along with 1 shot of liquor daily (down from 1 pint daily) - says that he last used cocaine on 11/10/17; tested positive on 11/13/17 - complete cessation of all above substances discussed for 3 minutes with him - explained that now that he's on carvedilol, his risk of an MI may be increased if he continues to use cocaine  4: Lymphedema- - stage 2 - no longer has any edema  Medication bottles were reviewed.

## 2018-01-21 ENCOUNTER — Ambulatory Visit: Payer: BLUE CROSS/BLUE SHIELD | Admitting: Family

## 2018-01-21 ENCOUNTER — Telehealth: Payer: Self-pay | Admitting: Family

## 2018-01-21 NOTE — Telephone Encounter (Signed)
Patient did not show for his Heart Failure Clinic appointment on 01/21/18. Will attempt to reschedule.

## 2018-01-23 NOTE — Progress Notes (Deleted)
Cardiology Office Note  Date:  01/23/2018   ID:  Marcus Foster, DOB [birth year], MRN [id number]  PCP:  Marcus Gang, FNP   No chief complaint on file.   HPI:  [name] a57 y.o.malewith a history of childhood asthma,  esophageal stricture, dysphagia, diverticulosis,  heavy alcohol abuse,  chronic tobacco smoking abuse,  chronic cocaine abuse, recent abuse Presenting to the hospital November 2018 with worsening cough, shortness of breath, dyspnea on exertion,  alcohol level 270 Diagnosed with dilated cardiomyopathy ejection fraction 25-30% felt secondary to alcohol abuse, chronic hypertension [place] history of noncompliance, missing his Lasix on weekends High salt diet Presenting for routine follow-up of his cardiomyopathy, moderate mitral valve regurg   In the hospital 11/2017 CHF Hospital records reviewed with the patient in detail admitted 2/1 with acute on chronic systolic CHF in the setting of noncompliance, ETOH abuse. Has severe leg and scrotal edema, shortness of breath Was not taking lasix 40 BID  Studies done in the hospital Echo 08/2017 EF 20 to 25%, RV mod reduced systolic function  Cardiac cath 11/19/2017  Prox LAD lesion is 60% stenosed.  Dist LAD lesion is 40% stenosed.  Mid RCA lesion is 40% stenosed.  There is severe left ventricular systolic dysfunction.  LV end diastolic pressure is severely elevated.   1.  Mild to moderate nonobstructive coronary artery disease with heavily calcified vessels.  The worst stenosis is 60% in the proximal LAD. 2.  Severely reduced LV systolic function with an EF of 15% and global hypokinesis.   3.  Right heart catheterization showed moderately to severely elevated filling pressures, moderate pulmonary hypertension and mildly reduced cardiac output.  RA pressure: 4 mmHg, RV pressure is 51 over 11 mmHg, PA pressure was 46/30 with a mean of 38 mmHg.  Pulmonary wedge pressure was 28 mmHg, cardiac output was 4.62 and  cardiac index was 2.21.  Recommendations: Recommend medical therapy for nonobstructive coronary artery disease.  nonischemic cardiomyopathy diagnosed   Seen in CHF clinic November 25, 2017 Notes indicating still a small amount of smoking and small amount of liquor daily Tested positive for cocaine November 13, 2017   By his report Weight after hospital 186 Weight at home  179 to 180 In the office today 174 Suspect his scale does not match our scale  He reports having some continued shortness of breath on exertion Does not feel that he is ready to go back to work At work has to walk [name] distances and will give out  EKG personally reviewed by myself on todays visit Shows normal sinus rhythm rate 92 bpm nonspecific T wave abnormality  other past medical history reviewed CHF clinic September 16, 2017 Diuresed greater than 10 L on admission November 2018    PMH:   has a past medical history of Acid reflux, Asthma, Chronic systolic CHF (congestive heart failure) (HCC), Hypertension, Noncompliance, Obesity, and Polysubstance abuse (HCC).  PSH:    Past Surgical History:  Procedure Laterality Date  . COLONOSCOPY    . ESOPHAGOGASTRODUODENOSCOPY    . RIGHT/LEFT HEART CATH AND CORONARY ANGIOGRAPHY N/A 11/19/2017   Procedure: RIGHT/LEFT HEART CATH AND CORONARY ANGIOGRAPHY;  Surgeon: Iran Ouch, MD;  Location: [facility] INVASIVE CV LAB;  Service: Cardiovascular;  Laterality: N/A;    Current Outpatient Medications  Medication Sig Dispense Refill  . aspirin 81 MG EC tablet Take 1 tablet (81 mg total) by mouth daily. 30 tablet 3  . atorvastatin (LIPITOR) 40 MG tablet Take 1 tablet (40  mg total) by mouth daily. 30 tablet 3  . carvedilol (COREG) 6.25 MG tablet Take 1 tablet (6.25 mg total) by mouth 2 (two) times daily. 60 tablet 3  . folic acid (FOLVITE) 1 MG tablet Take 1 tablet (1 mg total) by mouth daily. 30 tablet 0  . furosemide (LASIX) 40 MG tablet Take 1 tablet (40 mg total) by mouth  2 (two) times daily. 6 tablet 3  . levothyroxine (SYNTHROID, LEVOTHROID) 50 MCG tablet Take 1 tablet (50 mcg total) by mouth daily before breakfast. 30 tablet 3  . magnesium oxide (MAG-OX) 400 (241.3 Mg) MG tablet Take 1 tablet (400 mg total) by mouth daily. 30 tablet 3  . potassium chloride SA (K-DUR,KLOR-CON) 20 MEQ tablet Take 1 tablet (20 mEq total) by mouth 2 (two) times daily. 60 tablet 3  . sacubitril-valsartan (ENTRESTO) 24-26 MG Take 1 tablet by mouth 2 (two) times daily. 60 tablet 3   No current facility-administered medications for this visit.      Allergies:   Patient has no known allergies.   Social History:  The patient  reports that he has been smoking.  He has a 7.50 pack-year smoking history. He has never used smokeless tobacco. He reports that he drinks alcohol. He reports that he has current or past drug history. Drug: Cocaine.   Family History:   family history includes Hypertension in his mother.    Review of Systems: Review of Systems  Constitutional: Negative.   Respiratory: Positive for shortness of breath.   Cardiovascular: Negative.   Gastrointestinal: Negative.   Musculoskeletal: Negative.   Neurological: Negative.   Psychiatric/Behavioral: Negative.   All other systems reviewed and are negative.    PHYSICAL EXAM: VS:  There were no vitals taken for this visit. , BMI There is no height or weight on file to calculate BMI. Constitutional:  oriented to person, place, and time. No distress.  HENT:  Head: Normocephalic and atraumatic.  Eyes:  no discharge. No scleral icterus.  Neck: Normal range of motion. Neck supple. No JVD present.  Cardiovascular: Normal rate, regular rhythm, normal heart sounds and intact distal pulses. Exam reveals no gallop and no friction rub. No edema No murmur heard. Pulmonary/Chest: Effort normal and breath sounds normal. No stridor. No respiratory distress.  no wheezes.  no rales.  no tenderness.  Abdominal: Soft.  no  distension.  no tenderness.  Musculoskeletal: Normal range of motion.  no  tenderness or deformity.  Neurological:  normal muscle tone. Coordination normal. No atrophy Skin: Skin is warm and dry. No rash noted. not diaphoretic.  Psychiatric:  normal mood and affect. behavior is normal. Thought content normal.     Recent Labs: 11/13/2017: B Natriuretic Peptide >4,500.0; TSH 7.739 11/17/2017: ALT 21 11/20/2017: Magnesium 1.8 01/13/2018: BUN 32; Creatinine, Ser 1.23; Hemoglobin 12.2; Platelets 195; Potassium 4.7; Sodium 139    Lipid Panel Lab Results  Component Value Date   CHOL 146 11/14/2017   HDL 78 11/14/2017   LDLCALC 55 11/14/2017   TRIG 65 11/14/2017      Wt Readings from Last 3 Encounters:  01/13/18 175 lb (79.4 kg)  12/07/17 174 lb (78.9 kg)  11/25/17 180 lb 3.2 oz (81.7 kg)       ASSESSMENT AND PLAN:  Essential hypertension Blood pressure is well controlled on today's visit. No changes made to the medications.  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 He will continue to work on this and does not want any assistance with chantix.   Cocaine abuse (HCC) Stressed importance of cocaine cessation  Nonischemic cardiomyopathy (HCC) - Plan: EKG 12-Lead Continue current medications, stressed medication compliance, no alcohol, no cocaine   Disposition:   F/U   1 month   Total encounter time more than 25 minutes  Greater than 50% was spent in counseling and coordination  of care with the patient    No orders of the defined types were placed in this encounter.    Signed, Dossie Arbour, M.D., Ph.D. 01/23/2018  [facility] [place], [place] [phone]

## 2018-01-25 ENCOUNTER — Ambulatory Visit: Payer: BLUE CROSS/BLUE SHIELD | Admitting: Cardiovascular Disease

## 2018-01-28 ENCOUNTER — Ambulatory Visit: Payer: BLUE CROSS/BLUE SHIELD | Admitting: Cardiovascular Disease

## 2018-01-31 NOTE — Progress Notes (Deleted)
Cardiology Office Note  Date:  01/31/2018   ID:  CLINT BIELLO, DOB 1961-06-27, MRN [id number]  PCP:  Armando Gang, FNP   No chief complaint on file.   HPI:  [name] a57 y.o.malewith a history of childhood asthma,  esophageal stricture, dysphagia, diverticulosis,  heavy alcohol abuse,  chronic tobacco smoking abuse,  chronic cocaine abuse, recent abuse Presenting to the hospital November 2018 with worsening cough, shortness of breath, dyspnea on exertion,  alcohol level 270 Diagnosed with dilated cardiomyopathy ejection fraction 25-30% felt secondary to alcohol abuse, chronic hypertension Zhao history of noncompliance, missing his Lasix on weekends High salt diet Presenting for routine follow-up of his cardiomyopathy, moderate mitral valve regurg   In the hospital 11/2017 CHF Hospital records reviewed with the patient in detail admitted 2/1 with acute on chronic systolic CHF in the setting of noncompliance, ETOH abuse. Has severe leg and scrotal edema, shortness of breath Was not taking lasix 40 BID  Studies done in the hospital Echo 08/2017 EF 20 to 25%, RV mod reduced systolic function  Cardiac cath 11/19/2017  Prox LAD lesion is 60% stenosed.  Dist LAD lesion is 40% stenosed.  Mid RCA lesion is 40% stenosed.  There is severe left ventricular systolic dysfunction.  LV end diastolic pressure is severely elevated.   1.  Mild to moderate nonobstructive coronary artery disease with heavily calcified vessels.  The worst stenosis is 60% in the proximal LAD. 2.  Severely reduced LV systolic function with an EF of 15% and global hypokinesis.   3.  Right heart catheterization showed moderately to severely elevated filling pressures, moderate pulmonary hypertension and mildly reduced cardiac output.  RA pressure: 4 mmHg, RV pressure is 51 over 11 mmHg, PA pressure was 46/30 with a mean of 38 mmHg.  Pulmonary wedge pressure was 28 mmHg, cardiac output was 4.62 and  cardiac index was 2.21.  Recommendations: Recommend medical therapy for nonobstructive coronary artery disease.  nonischemic cardiomyopathy diagnosed   Seen in CHF clinic November 25, 2017 Notes indicating still a small amount of smoking and small amount of liquor daily Tested positive for cocaine November 13, 2017   By his report Weight after hospital 186 Weight at home  179 to 180 In the office today 174 Suspect his scale does not match our scale  He reports having some continued shortness of breath on exertion Does not feel that he is ready to go back to work At work has to walk [place] distances and will give out  EKG personally reviewed by myself on todays visit Shows normal sinus rhythm rate 92 bpm nonspecific T wave abnormality  other past medical history reviewed CHF clinic September 16, 2017 Diuresed greater than 10 L on admission November 2018    PMH:   has a past medical history of Acid reflux, Asthma, Chronic systolic CHF (congestive heart failure) (HCC), Hypertension, Noncompliance, Obesity, and Polysubstance abuse (HCC).  PSH:    Past Surgical History:  Procedure Laterality Date  . COLONOSCOPY    . ESOPHAGOGASTRODUODENOSCOPY    . RIGHT/LEFT HEART CATH AND CORONARY ANGIOGRAPHY N/A 11/19/2017   Procedure: RIGHT/LEFT HEART CATH AND CORONARY ANGIOGRAPHY;  Surgeon: Iran Ouch, MD;  Location: [facility] INVASIVE CV LAB;  Service: Cardiovascular;  Laterality: N/A;    Current Outpatient Medications  Medication Sig Dispense Refill  . aspirin 81 MG EC tablet Take 1 tablet (81 mg total) by mouth daily. 30 tablet 3  . atorvastatin (LIPITOR) 40 MG tablet Take 1 tablet (40  mg total) by mouth daily. 30 tablet 3  . carvedilol (COREG) 6.25 MG tablet Take 1 tablet (6.25 mg total) by mouth 2 (two) times daily. 60 tablet 3  . folic acid (FOLVITE) 1 MG tablet Take 1 tablet (1 mg total) by mouth daily. 30 tablet 0  . furosemide (LASIX) 40 MG tablet Take 1 tablet (40 mg total) by mouth  2 (two) times daily. 6 tablet 3  . levothyroxine (SYNTHROID, LEVOTHROID) 50 MCG tablet Take 1 tablet (50 mcg total) by mouth daily before breakfast. 30 tablet 3  . magnesium oxide (MAG-OX) 400 (241.3 Mg) MG tablet Take 1 tablet (400 mg total) by mouth daily. 30 tablet 3  . potassium chloride SA (K-DUR,KLOR-CON) 20 MEQ tablet Take 1 tablet (20 mEq total) by mouth 2 (two) times daily. 60 tablet 3  . sacubitril-valsartan (ENTRESTO) 24-26 MG Take 1 tablet by mouth 2 (two) times daily. 60 tablet 3   No current facility-administered medications for this visit.      Allergies:   Patient has no known allergies.   Social History:  The patient  reports that he has been smoking.  He has a 7.50 pack-year smoking history. He has never used smokeless tobacco. He reports that he drinks alcohol. He reports that he has current or past drug history. Drug: Cocaine.   Family History:   family history includes Hypertension in his mother.    Review of Systems: Review of Systems  Constitutional: Negative.   Respiratory: Positive for shortness of breath.   Cardiovascular: Negative.   Gastrointestinal: Negative.   Musculoskeletal: Negative.   Neurological: Negative.   Psychiatric/Behavioral: Negative.   All other systems reviewed and are negative.    PHYSICAL EXAM: VS:  There were no vitals taken for this visit. , BMI There is no height or weight on file to calculate BMI. Constitutional:  oriented to person, place, and time. No distress.  HENT:  Head: Normocephalic and atraumatic.  Eyes:  no discharge. No scleral icterus.  Neck: Normal range of motion. Neck supple. No JVD present.  Cardiovascular: Normal rate, regular rhythm, normal heart sounds and intact distal pulses. Exam reveals no gallop and no friction rub. No edema No murmur heard. Pulmonary/Chest: Effort normal and breath sounds normal. No stridor. No respiratory distress.  no wheezes.  no rales.  no tenderness.  Abdominal: Soft.  no  distension.  no tenderness.  Musculoskeletal: Normal range of motion.  no  tenderness or deformity.  Neurological:  normal muscle tone. Coordination normal. No atrophy Skin: Skin is warm and dry. No rash noted. not diaphoretic.  Psychiatric:  normal mood and affect. behavior is normal. Thought content normal.     Recent Labs: 11/13/2017: B Natriuretic Peptide >4,500.0; TSH 7.739 11/17/2017: ALT 21 11/20/2017: Magnesium 1.8 01/13/2018: BUN 32; Creatinine, Ser 1.23; Hemoglobin 12.2; Platelets 195; Potassium 4.7; Sodium 139    Lipid Panel Lab Results  Component Value Date   CHOL 146 11/14/2017   HDL 78 11/14/2017   LDLCALC 55 11/14/2017   TRIG 65 11/14/2017      Wt Readings from Last 3 Encounters:  01/13/18 175 lb (79.4 kg)  12/07/17 174 lb (78.9 kg)  11/25/17 180 lb 3.2 oz (81.7 kg)       ASSESSMENT AND PLAN:  Essential hypertension Blood pressure is well controlled on today's visit. No changes made to the medications.  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 He will continue to work on this and does not want any assistance with chantix.   Cocaine abuse (HCC) Stressed importance of cocaine cessation  Nonischemic cardiomyopathy (HCC) - Plan: EKG 12-Lead Continue current medications, stressed medication compliance, no alcohol, no cocaine   Disposition:   F/U   1 month   Total encounter time more than 25 minutes  Greater than 50% was spent in counseling and coordination  of care with the patient    No orders of the defined types were placed in this encounter.    Signed, Dossie Arbour, M.D., Ph.D. 01/31/2018  [facility] [place], [place] [phone]

## 2018-02-02 ENCOUNTER — Ambulatory Visit: Payer: BLUE CROSS/BLUE SHIELD | Admitting: Cardiovascular Disease

## 2018-02-10 ENCOUNTER — Telehealth: Payer: Self-pay | Admitting: Cardiovascular Disease

## 2018-02-10 NOTE — Telephone Encounter (Signed)
Received records request from [employer] , forwarded to [employer] for processing. ° °

## 2018-02-16 ENCOUNTER — Ambulatory Visit: Payer: BLUE CROSS/BLUE SHIELD | Admitting: Cardiovascular Disease

## 2018-02-23 ENCOUNTER — Telehealth: Payer: Self-pay | Admitting: Cardiovascular Disease

## 2018-02-23 NOTE — Telephone Encounter (Signed)
Received records request [employer], forwarded to [employer] for processing.  

## 2018-02-25 ENCOUNTER — Encounter: Payer: Self-pay | Admitting: *Deleted

## 2018-02-25 ENCOUNTER — Telehealth: Payer: Self-pay | Admitting: Cardiovascular Disease

## 2018-02-25 NOTE — Telephone Encounter (Signed)
° °  [facility] Pre-operative Risk Assessment    Request for surgical clearance:  1. What type of surgery is being performed? Right Total Hip   2. When is this surgery scheduled? 01/13/18 cancelled due to + UDS   3. What type of clearance is required (medical clearance vs. Pharmacy clearance to hold med vs. Both)? medical  4. Are there any medications that need to be held prior to surgery and how [place]? Not noted on form   5. Practice name and name of physician performing surgery? [facility] Pre [facility] Dr. Harlow Mares   6. What is your office phone number [phone]   7.   What is your office fax number [phone]  8.   Anesthesia type (None, local, MAC, general) ? Not noted on form    Clarisse Gouge 02/25/2018, 2:56 PM  _________________________________________________________________   (provider comments below)

## 2018-02-25 NOTE — Telephone Encounter (Signed)
Advised Dr. Windell Hummingbird nurse, Pam of pending surgery for Monday 03/01/18. She will review with Dr. Mariah Milling regarding clearance. Patient last seen 12/07/17- EF 15% per 11/19/17 cath.

## 2018-02-25 NOTE — Pre-Procedure Instructions (Signed)
REVIEWING PATIENT CHART FOR 03/01/18. SURGERY 01/13/18 [facility] FOR POSITIVE UDS. MULTIPLE NO SHOW APPTS AT DR [facility] HEART CLINIC. AS INSTRUCTED BY DR P CARROLL, FAXED AND CALLED REQUEST FOR CARDIAC CLEARANCE TO [facility]. ALSO FAXED AND LM WITH INFO FOR Isebella U[place] AT DR Maxcine Ham.SPOKE WITH JENNIFER AT DR Windell Hummingbird

## 2018-02-26 NOTE — Telephone Encounter (Signed)
Pt is coming on 5/20 at 1:30 to see Eula Listen, PA

## 2018-02-26 NOTE — Telephone Encounter (Signed)
Reviewed request for cardiac clearance with Dr. Mariah Milling and he advised that patient would need to be seen due to his health status. Will route to scheduling for assistance with setting up appointment.

## 2018-02-26 NOTE — Telephone Encounter (Signed)
Spoke with patient and advised that he will need to come in and be seen in order for [place] to clear him for his surgery. He verbalized understanding with no further questions at this time.

## 2018-03-01 ENCOUNTER — Ambulatory Visit: Payer: BLUE CROSS/BLUE SHIELD | Admitting: Physician Assistant

## 2018-03-01 ENCOUNTER — Encounter: Payer: Self-pay | Admitting: Physician Assistant

## 2018-03-01 ENCOUNTER — Telehealth: Payer: Self-pay | Admitting: Cardiovascular Disease

## 2018-03-01 ENCOUNTER — Ambulatory Visit (INDEPENDENT_AMBULATORY_CARE_PROVIDER_SITE_OTHER): Payer: BLUE CROSS/BLUE SHIELD | Admitting: Physician Assistant

## 2018-03-01 VITALS — BP 122/80 | HR 81 | Ht 70.0 in | Wt 183.0 lb

## 2018-03-01 DIAGNOSIS — Z9119 Patient's noncompliance with other medical treatment and regimen: Secondary | ICD-10-CM

## 2018-03-01 DIAGNOSIS — I428 Other cardiomyopathies: Secondary | ICD-10-CM | POA: Diagnosis not present

## 2018-03-01 DIAGNOSIS — I10 Essential (primary) hypertension: Secondary | ICD-10-CM

## 2018-03-01 DIAGNOSIS — F17200 Nicotine dependence, unspecified, uncomplicated: Secondary | ICD-10-CM

## 2018-03-01 DIAGNOSIS — I5022 Chronic systolic (congestive) heart failure: Secondary | ICD-10-CM | POA: Diagnosis not present

## 2018-03-01 DIAGNOSIS — F1010 Alcohol abuse, uncomplicated: Secondary | ICD-10-CM | POA: Diagnosis not present

## 2018-03-01 DIAGNOSIS — Z91199 Patient's noncompliance with other medical treatment and regimen due to unspecified reason: Secondary | ICD-10-CM

## 2018-03-01 DIAGNOSIS — I2510 Atherosclerotic heart disease of native coronary artery without angina pectoris: Secondary | ICD-10-CM | POA: Diagnosis not present

## 2018-03-01 DIAGNOSIS — F1410 Cocaine abuse, uncomplicated: Secondary | ICD-10-CM | POA: Diagnosis not present

## 2018-03-01 MED ORDER — SACUBITRIL-VALSARTAN 49-51 MG PO TABS: 1.0000 | ORAL_TABLET | Freq: Two times a day (BID) | ORAL | 6 refills | Status: DC

## 2018-03-01 NOTE — Patient Instructions (Addendum)
Medication Instructions: - Your physician has recommended you make the following change in your medication:  1) INCREASE entresto to 49/51 mg- take 1 tablet by mouth TWICE daily  Labwork: - Your physician recommends that you have lab work today: BMP  Procedures/Testing: - none ordered  Follow-Up: - Your physician recommends that you schedule a follow-up appointment in: 3 months with Dr. Mariah Milling.   Any Additional Special Instructions Will Be Listed Below (If Applicable).     If you need a refill on your cardiac medications before your next appointment, please call your pharmacy.

## 2018-03-01 NOTE — Telephone Encounter (Signed)
To Eula Listen, PA reviewAlycia Rossetti, which one do you want him to stay on and which labs added on?

## 2018-03-01 NOTE — Progress Notes (Signed)
Cardiology Office Note Date:  03/01/2018  Patient ID:  Marcus, Foster Mar 18, 1961, MRN [id number] PCP:  Armando Gang, FNP  Cardiologist:  Dr. Mariah Milling, MD    Chief Complaint: Preoperative cardiac evaluation  History of Present Illness: Marcus Foster is a 57 y.o. male with history of nonobstructive CAD by [facility] in 11/2017 as detailed below, dilated NICM, polysubstance abuse with ongoing cocaine, etoh, and tobacco abuse, HTN, and noncompliance who presents for preoperative cardiac evaluation.   Patient was previously admitted to the hospital in 08/2017 with acute systolic CHF.  Echocardiogram at that time showed an EF of 20 to 25%, moderate LVH, unable to exclude regional wall motion abnormalities, mild to moderate MR, moderately dilated LV, mildly dilated RV with moderately reduced RV SF, mildly dilated RA, trivial pericardial effusion.  He was diuresed from an admission weight of 210 pounds to a discharge weight of 182 pounds.  Troponin peaked at 0.05.  Urine drug screen was positive for cocaine, EtOH elevated at 270, BNP 2029.  He was readmitted to the hospital in 11/2017 with acute on chronic systolic CHF in the setting of ongoing polysubstance abuse with cocaine, alcohol, and tobacco as well as medical noncompliance.  He underwent right and left heart catheterization on 11/19/2017 that showed mild to moderate nonobstructive CAD with heavily calcified vessels.  Proximal LAD 60% stenosed, distal LAD 40% stenosed, mid RCA 40s percent stenosed, EF approximately 15% with global hypokinesis with severely elevated LVEDP.  Right heart catheterization showed a moderately to severely elevated filling pressures, moderate pulmonary hypertension, and mildly reduced cardiac output.   RA pressure: 4 mmHg, RV pressure is 51 over 11 mmHg, PA pressure was 46/30 with a mean of 38 mmHg.  Pulmonary wedge pressure was 28 mmHg, cardiac output was 4.62 and cardiac index was 2.21.  He was diuresed from an admission weight  of 220 pounds to a discharge weight of 193 pounds.  He was last seen in our office for hospital follow-up on 12/07/2017 with a weight of 174 pounds.  He was noted to have tested positive for cocaine on 11/13/2017.  He has since been scheduled for a right hip arthroplasty initially on 01/18/2018, however this was postponed secondary to urine drug screen continuing to show positive for cocaine.  Patient comes in today for preoperative cardiac evaluation.  He reports he last used cocaine just prior to his urine drug screen on 01/18/2018.  He reports compliance with his medications when it covered in detail today.  He denies any chest pain.  He has chronic stable exertional dyspnea and is able to ambulate approximately 1.5 blocks prior to developing shortness of breath.  He has no orthopnea, no lower extremity swelling, no PND, no cough, no abdominal distention, or no early satiety.  He reports since he has quit using cocaine his appetite has significantly improved.  His weight at home has been stable at approximately 180 pounds.  He does not check his blood pressure at home.  He continues to report he is unable to perform his job secondary to right hip pain and exertional dyspnea.   Past Medical History:  Diagnosis Date  . Acid reflux   . Asthma   . Chronic systolic CHF (congestive heart failure) (HCC)    a. TTE 08/2017: EF 20-25%, moderate LVH, unable to exclude RWMA, mild to moderate MR, moderately dilated LV, mildly dilated RV with moderately reduced RVSF, mildly dilated RA, trivial pericardial effusion  . Hypertension   . Noncompliance   .  Obesity   . Polysubstance abuse (HCC)    a. ongoing cocaine, tobacco, and alcohol abuse    Past Surgical History:  Procedure Laterality Date  . COLONOSCOPY    . CORONARY ANGIOPLASTY    . ESOPHAGOGASTRODUODENOSCOPY    . RIGHT/LEFT HEART CATH AND CORONARY ANGIOGRAPHY N/A 11/19/2017   Procedure: RIGHT/LEFT HEART CATH AND CORONARY ANGIOGRAPHY;  Surgeon: Iran Ouch, MD;  Location: [facility] INVASIVE CV LAB;  Service: Cardiovascular;  Laterality: N/A;    Current Meds  Medication Sig  . aspirin 81 MG EC tablet Take 1 tablet (81 mg total) by mouth daily.  Marland Kitchen atorvastatin (LIPITOR) 40 MG tablet Take 1 tablet (40 mg total) by mouth daily.  . carvedilol (COREG) 6.25 MG tablet Take 1 tablet (6.25 mg total) by mouth 2 (two) times daily.  . empagliflozin (JARDIANCE) 10 MG TABS tablet Take 10 mg by mouth daily.  . Fluticasone-Umeclidin-Vilant (TRELEGY ELLIPTA) 100-62.5-25 MCG/INH AEPB Inhale 1 puff into the lungs daily.  . folic acid (FOLVITE) 1 MG tablet Take 1 tablet (1 mg total) by mouth daily.  . furosemide (LASIX) 40 MG tablet Take 1 tablet (40 mg total) by mouth 2 (two) times daily.  Marland Kitchen levothyroxine (SYNTHROID, LEVOTHROID) 50 MCG tablet Take 1 tablet (50 mcg total) by mouth daily before breakfast.  . magnesium oxide (MAG-OX) 400 (241.3 Mg) MG tablet Take 1 tablet (400 mg total) by mouth daily.  . potassium chloride SA (K-DUR,KLOR-CON) 20 MEQ tablet Take 1 tablet (20 mEq total) by mouth 2 (two) times daily.  . [DISCONTINUED] sacubitril-valsartan (ENTRESTO) 24-26 MG Take 1 tablet by mouth 2 (two) times daily.    Allergies:   Patient has no known allergies.   Social History:  The patient  reports that he has been smoking.  He has a 7.50 pack-year smoking history. He has never used smokeless tobacco. He reports that he drinks alcohol. He reports that he has current or past drug history. Drug: Cocaine.   Family History:  The patient's family history includes Hypertension in his mother.  ROS:   Review of Systems  Constitutional: Positive for malaise/fatigue. Negative for chills, diaphoresis, fever and weight loss.  HENT: Negative for congestion.   Eyes: Negative for discharge and redness.  Respiratory: Positive for shortness of breath. Negative for cough, hemoptysis, sputum production and wheezing.        Chronic stable exertional dyspnea with ambulating  approximately 1.5 blocks  Cardiovascular: Negative for chest pain, palpitations, orthopnea, claudication, leg swelling and PND.  Gastrointestinal: Negative for abdominal pain, blood in stool, heartburn, melena, nausea and vomiting.  Genitourinary: Negative for hematuria.  Musculoskeletal: Positive for back pain, joint pain and myalgias. Negative for falls.  Skin: Negative for rash.  Neurological: Positive for weakness. Negative for dizziness, tingling, tremors, sensory change, speech change, focal weakness and loss of consciousness.  Endo/Heme/Allergies: Does not bruise/bleed easily.  Psychiatric/Behavioral: Negative for substance abuse. The patient is not nervous/anxious.   All other systems reviewed and are negative.    PHYSICAL EXAM:  VS:  BP 122/80 (BP Location: Left Arm, Patient Position: Sitting, Cuff Size: Normal)   Pulse 81   Ht 5\' 10"  (1.778 m)   Wt 183 lb (83 kg)   BMI 26.26 kg/m  BMI: Body mass index is 26.26 kg/m.  Physical Exam  Constitutional: He is oriented to person, place, and time. He appears well-developed and well-nourished.  HENT:  Head: Normocephalic and atraumatic.  Eyes: Right eye exhibits no discharge. Left eye exhibits no discharge.  Neck: Normal range of motion. No JVD present.  Cardiovascular: Normal rate, regular rhythm, S1 normal and S2 normal. Exam reveals no distant heart sounds, no friction rub, no midsystolic click and no opening snap.  Murmur heard. High-pitched blowing holosystolic murmur is present with a grade of 2/6 at the apex. Pulses:      Posterior tibial pulses are 2+ on the right side, and 2+ on the left side.  Pulmonary/Chest: Effort normal and breath sounds normal. No respiratory distress. He has no decreased breath sounds. He has no wheezes. He has no rales. He exhibits no tenderness.  Abdominal: Soft. He exhibits no distension. There is no tenderness.  Musculoskeletal: He exhibits no edema.  Neurological: He is alert and oriented to  person, place, and time.  Skin: Skin is warm and dry. No cyanosis. Nails show no clubbing.  Psychiatric: He has a normal mood and affect. His speech is normal and behavior is normal. Judgment and thought content normal.     EKG:  Was ordered and interpreted by me today. Shows NSR, 81 bpm, nonspecific lateral ST-T changes  Recent Labs: 11/13/2017: B Natriuretic Peptide >4,500.0; TSH 7.739 11/17/2017: ALT 21 11/20/2017: Magnesium 1.8 01/13/2018: BUN 32; Creatinine, Ser 1.23; Hemoglobin 12.2; Platelets 195; Potassium 4.7; Sodium 139  11/14/2017: Cholesterol 146; HDL 78; LDL Cholesterol 55; Total CHOL/HDL Ratio 1.9; Triglycerides 65; VLDL 13   CrCl cannot be calculated (Patient's most recent lab result is older than the maximum 21 days allowed.).   Wt Readings from Last 3 Encounters:  03/01/18 183 lb (83 kg)  01/13/18 175 lb (79.4 kg)  12/07/17 174 lb (78.9 kg)     Other studies reviewed: Additional studies/records reviewed today include: summarized above  ASSESSMENT AND PLAN:  1. Nonobstructive CAD: No symptoms concerning for angina at this time.  Recent left heart catheterization in 11/2017 as detailed above.  Continue aspirin as well as aggressive lifestyle modification as detailed below.  No plans for further ischemic evaluation at this time.  2. Dilated, NICM/pulmonary hypertension: He does not appear grossly volume overloaded at this time.  His weight is up 9 pounds from his 11/2017 office visit however he attributes this to actual weight gain from increased appetite secondary to reported cocaine cessation.  Increase Entresto to 48/52 mg twice daily.  Continue Coreg 6.25 mg twice daily as well as Lasix 40 mg twice daily.  Check BMP.  CHF education provided.  Consider addition of spironolactone pending BMP results.  If he truly has been abstinent from cocaine abuse we could consider repeating limited echocardiogram following escalation of evidence-based therapy in the next several months to  evaluate for improved LV systolic function.  Weights have remained stable at home.  3. Polysubstance abuse: Most recent urine drug screen continues to be positive for cocaine.  Patient reports he last utilized cocaine just prior to the drug screen performed on 01/18/2018.  I will defer further drug screens to patient's PCP and anesthesiology.  Complete cessation has been advised.  4. HTN: Blood pressure is well controlled today as above.  Continue current regimen as detailed above.  5. Pre-operative cardiac evaluation: Recent R/LHC as above. Given the patient's severe cardiomyopathy, though nonobstructive and driven by poor lifestyle choices, he would be high risk for any non-cardiac procedure. There is no further testing that would potentially improve this risk.   6. Medication management: We received a fax from the patient's PCP after his office visit indicating his PCP had started him on simvastatin 5 mg  daily in addition to his previously scheduled Lipitor 40 mg daily.  Patient has been advised to discontinue simvastatin and continue Lipitor only.  We will add on liver function testing as well as a CK to his previously drawn a BMP today given he has been placed on 2 separate statins.  7. Mitral regurgitation: Asymptomatic.  Continue to monitor clinically.  Disposition: F/u with Dr. Mariah Milling in 3 months.  Current medicines are reviewed at length with the patient today.  The patient did not have any concerns regarding medicines.  Signed, Eula Listen, PA-C 03/01/2018 3:13 PM     [facility] [address] [place], [place] [address] [phone]

## 2018-03-01 NOTE — Telephone Encounter (Signed)
Pt calling stating he was advised to call in his current medication   Simvastatin 5 mg once a day in the evening  atorvastatin 40 mg once a day    He is needing to know which one to take and which one not to take  Please advise

## 2018-03-02 ENCOUNTER — Telehealth: Payer: Self-pay | Admitting: Cardiovascular Disease

## 2018-03-02 LAB — BASIC METABOLIC PANEL
BUN/Creatinine Ratio: 9 (ref 9–20)
BUN: 12 mg/dL (ref 6–24)
CALCIUM: 10 mg/dL (ref 8.7–10.2)
CHLORIDE: 100 mmol/L (ref 96–106)
CO2: 24 mmol/L (ref 20–29)
Creatinine, Ser: 1.31 mg/dL — ABNORMAL HIGH (ref 0.76–1.27)
GFR calc non Af Amer: 59 mL/min/{1.73_m2} — ABNORMAL LOW (ref >59)
GFR, EST AFRICAN AMERICAN: 68 mL/min/{1.73_m2} (ref >59)
Glucose: 72 mg/dL (ref 65–99)
Potassium: 4.7 mmol/L (ref 3.5–5.2)
Sodium: 142 mmol/L (ref 134–144)

## 2018-03-02 NOTE — Telephone Encounter (Signed)
Please take Lipitor 40 mg daily.  Do not take simvastatin.   Please add on LFT and CK.

## 2018-03-02 NOTE — Telephone Encounter (Signed)
I spoke with the patient.  He is aware to stop simvastatin and stay on atorvastatin 40 mg once daily. He is aware that we added on a liver/ CK level to his labs from yesterday since he was taking both statins. He states he only did this 1 time.  I advised I will call back with additional results once received.

## 2018-03-02 NOTE — Telephone Encounter (Signed)
Spoke with Jillyn Hidden from [facility].  Asked him what was the the last medication patient attempted to pick up.  Jillyn Hidden stated patient never came to pick up his atorvastatin that when he came he was coming to pick up his Entresto.  Jillyn Hidden confirmed that patients Marcus Foster was $25 for a 30 Day supply.

## 2018-03-02 NOTE — Telephone Encounter (Signed)
I called the patient's contact #- a male answered the phone and said the patient was gone. He asked that I leave a message with him. I advised him I did not have it on file to speak with him, but asked him to have the patient call us back.  The person I spoke with advised he would give him the message.

## 2018-03-02 NOTE — Telephone Encounter (Signed)
Spoke with pharmacy to try to clarify pricing.  Patient last picked up atorvastatin in February (90-day supply). It costs around $24. Last picked up simvastatin 5 mg around the first week of May. No charge. Telephone prio telephone note regarding patient needing to stop simvastatin and stay on atorvastatin.  Sherryll Burger was run using [employer] co-pay card. Patient can get 30- or 90-day supply for $10.    Attempted to reach patient to discuss in further detail. Patient was not home but lady who answered will let him know to call us back.

## 2018-03-02 NOTE — Telephone Encounter (Signed)
I called [facility] at [phone] and requested an add on Liver/ CK- total per [place], [place].   I attempted to call the patient.  He was unavailable at the time, but should be available in the next 30 min-1 hour. I advised I will call back.

## 2018-03-02 NOTE — Telephone Encounter (Signed)
Pt calling stating he received that his pharmacy told him that the Atorvastatin we called in is going to be about $200  He states he can not afford this Would like another option for this  Please call back

## 2018-03-03 NOTE — Telephone Encounter (Signed)
S/w patient. He verbalized understanding to only take Lipitor. He said he had no problem picking up the atorvastatin and said that the pharmacy called to let him know his Sherryll Burger was ready for $10. He verbalized understanding of lab results as well.

## 2018-03-03 NOTE — Telephone Encounter (Signed)
No answer. Left message to call back on home number listed. Left verbal message with a woman and she said she would have patient call back when he's available.

## 2018-03-04 ENCOUNTER — Telehealth: Payer: Self-pay | Admitting: Cardiovascular Disease

## 2018-03-04 NOTE — Telephone Encounter (Signed)
Received records request Disabilty Determination Services, forwarded to [employer] for processing.  

## 2018-03-12 ENCOUNTER — Ambulatory Visit: Payer: BLUE CROSS/BLUE SHIELD | Admitting: Physician Assistant

## 2018-03-16 ENCOUNTER — Telehealth: Payer: Self-pay | Admitting: Cardiovascular Disease

## 2018-03-16 ENCOUNTER — Ambulatory Visit: Payer: BLUE CROSS/BLUE SHIELD | Admitting: Physician Assistant

## 2018-03-16 NOTE — Pre-Procedure Instructions (Signed)
CLEARANCE BY CARDIOLOGY VERY HIGH RISK. AS INSTRUCTED BY DR Ether Griffins, DR BOWER'S NOTIFIED PATIENT NEEDS SURGERY AT [facility]. SPOKE WITH Teoman Giraud AT THEIR OFFICE

## 2018-03-16 NOTE — Telephone Encounter (Signed)
A copy of Ryan's last office note from 03/01/18 as faxed to [facility] at [phone] - attn: [name].  This had a statement of clearance addressed in the assessment and plan.   Confirmation received.

## 2018-03-16 NOTE — Telephone Encounter (Signed)
Left voicemail message for [name] to call back from [facility].

## 2018-03-16 NOTE — Telephone Encounter (Signed)
Sherry from [employer] they have heard from hospital that he was cancelled for surgery [facility] will need something in writing stating that the patient is at high risk for surgery  Please contact [name] with any questions They would like for the statement to be faxed to [phone]

## 2018-03-17 LAB — HEPATIC FUNCTION PANEL
ALT: 28 IU/L (ref 0–44)
AST: 36 IU/L (ref 0–40)
Albumin: 4.7 g/dL (ref 3.5–5.5)
Alkaline Phosphatase: 98 IU/L (ref 39–117)
Bilirubin Total: 0.4 mg/dL (ref 0.0–1.2)
Bilirubin, Direct: 0.13 mg/dL (ref 0.00–0.40)
Total Protein: 7.8 g/dL (ref 6.0–8.5)

## 2018-03-17 LAB — CK: CK TOTAL: 70 U/L (ref 24–204)

## 2018-03-17 LAB — SPECIMEN STATUS REPORT

## 2018-03-25 ENCOUNTER — Inpatient Hospital Stay
Admission: RE | Admit: 2018-03-25 | Payer: BLUE CROSS/BLUE SHIELD | Source: Ambulatory Visit | Admitting: Orthopedic Surgery

## 2018-03-25 ENCOUNTER — Encounter: Admission: RE | Payer: Self-pay | Source: Ambulatory Visit

## 2018-03-25 SURGERY — ARTHROPLASTY, HIP, TOTAL, ANTERIOR APPROACH
Anesthesia: Choice | Laterality: Right

## 2018-04-12 ENCOUNTER — Telehealth: Payer: Self-pay | Admitting: Cardiovascular Disease

## 2018-04-12 NOTE — Telephone Encounter (Signed)
Received records request Disabilty Determination Services, forwarded to [employer] for processing.  

## 2018-05-31 NOTE — Progress Notes (Deleted)
Cardiology Office Note  Date:  05/31/2018   ID:  Marcus Foster, DOB 07-Mar-1961, MRN [id number]  PCP:  Armando Gang, FNP   No chief complaint on file.   HPI:  Marcus Foster a57 y.o.malewith a history of childhood asthma,  esophageal stricture, dysphagia, diverticulosis,  heavy alcohol abuse,  chronic tobacco smoking abuse,  chronic cocaine abuse, recent abuse Presenting to the hospital November 2018 with worsening cough, shortness of breath, dyspnea on exertion,  alcohol level 270 Diagnosed with dilated cardiomyopathy ejection fraction 25-30% felt secondary to alcohol abuse, chronic hypertension Marcus Foster history of noncompliance, missing his Lasix on weekends High salt diet Presenting for routine follow-up of his cardiomyopathy, moderate mitral valve regurg   In the hospital 11/2017 CHF Hospital records reviewed with the patient in detail admitted 2/1 with acute on chronic systolic CHF in the setting of noncompliance, ETOH abuse. Has severe leg and scrotal edema, shortness of breath Was not taking lasix 40 BID  Studies done in the hospital Echo 08/2017 EF 20 to 25%, RV mod reduced systolic function  Cardiac cath 11/19/2017  Prox LAD lesion is 60% stenosed.  Dist LAD lesion is 40% stenosed.  Mid RCA lesion is 40% stenosed.  There is severe left ventricular systolic dysfunction.  LV end diastolic pressure is severely elevated.   1.  Mild to moderate nonobstructive coronary artery disease with heavily calcified vessels.  The worst stenosis is 60% in the proximal LAD. 2.  Severely reduced LV systolic function with an EF of 15% and global hypokinesis.   3.  Right heart catheterization showed moderately to severely elevated filling pressures, moderate pulmonary hypertension and mildly reduced cardiac output.  RA pressure: 4 mmHg, RV pressure is 51 over 11 mmHg, PA pressure was 46/30 with a mean of 38 mmHg.  Pulmonary wedge pressure was 28 mmHg, cardiac output was 4.62 and  cardiac index was 2.21.  Recommendations: Recommend medical therapy for nonobstructive coronary artery disease.  nonischemic cardiomyopathy diagnosed   Seen in CHF clinic November 25, 2017 Notes indicating still a small amount of smoking and small amount of liquor daily Tested positive for cocaine November 13, 2017   By his report Weight after hospital 186 Weight at home  179 to 180 In the office today 174 Suspect his scale does not match our scale  He reports having some continued shortness of breath on exertion Does not feel that he is ready to go back to work At work has to walk [place] distances and will give out  EKG personally reviewed by myself on todays visit Shows normal sinus rhythm rate 92 bpm nonspecific T wave abnormality  other past medical history reviewed CHF clinic September 16, 2017 Diuresed greater than 10 L on admission November 2018    PMH:   has a past medical history of Acid reflux, Asthma, Chronic systolic CHF (congestive heart failure) (HCC), Hypertension, Noncompliance, Obesity, and Polysubstance abuse (HCC).  PSH:    Past Surgical History:  Procedure Laterality Date  . COLONOSCOPY    . CORONARY ANGIOPLASTY    . ESOPHAGOGASTRODUODENOSCOPY    . RIGHT/LEFT HEART CATH AND CORONARY ANGIOGRAPHY N/A 11/19/2017   Procedure: RIGHT/LEFT HEART CATH AND CORONARY ANGIOGRAPHY;  Surgeon: Iran Ouch, MD;  Location: [facility] INVASIVE CV LAB;  Service: Cardiovascular;  Laterality: N/A;    Current Outpatient Medications  Medication Sig Dispense Refill  . aspirin 81 MG EC tablet Take 1 tablet (81 mg total) by mouth daily. 30 tablet 3  . atorvastatin (LIPITOR) 40  MG tablet Take 1 tablet (40 mg total) by mouth daily. 30 tablet 3  . carvedilol (COREG) 6.25 MG tablet Take 1 tablet (6.25 mg total) by mouth 2 (two) times daily. 60 tablet 3  . empagliflozin (JARDIANCE) 10 MG TABS tablet Take 10 mg by mouth daily.    . Fluticasone-Umeclidin-Vilant (TRELEGY ELLIPTA)  100-62.5-25 MCG/INH AEPB Inhale 1 puff into the lungs daily.    . folic acid (FOLVITE) 1 MG tablet Take 1 tablet (1 mg total) by mouth daily. 30 tablet 0  . furosemide (LASIX) 40 MG tablet Take 1 tablet (40 mg total) by mouth 2 (two) times daily. 6 tablet 3  . levothyroxine (SYNTHROID, LEVOTHROID) 50 MCG tablet Take 1 tablet (50 mcg total) by mouth daily before breakfast. 30 tablet 3  . magnesium oxide (MAG-OX) 400 (241.3 Mg) MG tablet Take 1 tablet (400 mg total) by mouth daily. 30 tablet 3  . potassium chloride SA (K-DUR,KLOR-CON) 20 MEQ tablet Take 1 tablet (20 mEq total) by mouth 2 (two) times daily. 60 tablet 3  . sacubitril-valsartan (ENTRESTO) 49-51 MG Take 1 tablet by mouth 2 (two) times daily. 60 tablet 6   No current facility-administered medications for this visit.      Allergies:   Patient has no known allergies.   Social History:  The patient  reports that he has been smoking. He has a 7.50 pack-year smoking history. He has never used smokeless tobacco. He reports that he drinks alcohol. He reports that he has current or past drug history. Drug: Cocaine.   Family History:   family history includes Hypertension in his mother.    Review of Systems: Review of Systems  Constitutional: Negative.   Respiratory: Positive for shortness of breath.   Cardiovascular: Negative.   Gastrointestinal: Negative.   Musculoskeletal: Negative.   Neurological: Negative.   Psychiatric/Behavioral: Negative.   All other systems reviewed and are negative.    PHYSICAL EXAM: VS:  There were no vitals taken for this visit. , BMI There is no height or weight on file to calculate BMI. Constitutional:  oriented to person, place, and time. No distress.  HENT:  Head: Normocephalic and atraumatic.  Eyes:  no discharge. No scleral icterus.  Neck: Normal range of motion. Neck supple. No JVD present.  Cardiovascular: Normal rate, regular rhythm, normal heart sounds and intact distal pulses. Exam  reveals no gallop and no friction rub. No edema No murmur heard. Pulmonary/Chest: Effort normal and breath sounds normal. No stridor. No respiratory distress.  no wheezes.  no rales.  no tenderness.  Abdominal: Soft.  no distension.  no tenderness.  Musculoskeletal: Normal range of motion.  no  tenderness or deformity.  Neurological:  normal muscle tone. Coordination normal. No atrophy Skin: Skin is warm and dry. No rash noted. not diaphoretic.  Psychiatric:  normal mood and affect. behavior is normal. Thought content normal.     Recent Labs: 11/13/2017: B Natriuretic Peptide >4,500.0; TSH 7.739 11/20/2017: Magnesium 1.8 01/13/2018: Hemoglobin 12.2; Platelets 195 03/01/2018: ALT 28; BUN 12; Creatinine, Ser 1.31; Potassium 4.7; Sodium 142    Lipid Panel Lab Results  Component Value Date   CHOL 146 11/14/2017   HDL 78 11/14/2017   LDLCALC 55 11/14/2017   TRIG 65 11/14/2017      Wt Readings from Last 3 Encounters:  03/01/18 183 lb (83 kg)  01/13/18 175 lb (79.4 kg)  12/07/17 174 lb (78.9 kg)       ASSESSMENT AND PLAN:  Essential hypertension Blood  pressure is well controlled on today's visit. No changes made to the medications.  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 He will continue to work on this and does not want any assistance with chantix.   Cocaine abuse (HCC) Stressed importance of cocaine cessation  Nonischemic cardiomyopathy (HCC) - Plan: EKG 12-Lead Continue  current medications, stressed medication compliance, no alcohol, no cocaine   Disposition:   F/U   1 month   Total encounter time more than 25 minutes  Greater than 50% was spent in counseling and coordination of care with the patient    No orders of the defined types were placed in this encounter.    Signed, Dossie Arbour, M.D., Ph.D. 05/31/2018  [facility] [place], [place] [phone]

## 2018-06-01 ENCOUNTER — Ambulatory Visit: Payer: BLUE CROSS/BLUE SHIELD | Admitting: Cardiovascular Disease

## 2018-07-02 ENCOUNTER — Ambulatory Visit: Payer: BLUE CROSS/BLUE SHIELD | Admitting: Cardiovascular Disease

## 2018-08-08 NOTE — Progress Notes (Signed)
A 6 or potassiumCardiology Office Note  Date:  08/09/2018   ID:  Marcus Foster, DOB November 21, 1960, MRN [id number]  PCP:  Armando Gang, FNP   Chief Complaint  Patient presents with  . OTHER    3 month f/u c/o knee pain. Meds reviewed verbally with pt.    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 Has severe leg and scrotal edema, shortness of breath Was not taking lasix 40 BID  In follow-up today he is not on Lasix or potassium We called [facility] and he has not picked these up since February 2019 Reports he continues to drink alcohol Denies any significant  leg swelling, chronic shortness of breath No chest pain concerning for angina He does have other medications with him  EKG personally reviewed by myself on todays visit Shows normal sinus rhythm rate 71 bpm no significant ST or T wave changes  Other past medical history reviewed Echo 08/2017 EF 20 to 25%, RV mod reduced systolic function  Cardiac cath 11/19/2017  Prox LAD lesion is 60% stenosed.  Dist LAD lesion is 40% stenosed.  Mid RCA lesion is 40% stenosed.  There is severe left ventricular systolic dysfunction.  LV end diastolic pressure is severely elevated.   1.  Mild to moderate nonobstructive  coronary artery disease with heavily calcified vessels.  The worst stenosis is 60% in the proximal LAD. 2.  Severely reduced LV systolic function with an EF of 15% and global hypokinesis.   3.  Right heart catheterization showed moderately to severely elevated filling pressures, moderate pulmonary hypertension and mildly reduced cardiac output.  RA pressure: 4 mmHg, RV pressure is 51 over 11 mmHg, PA pressure was 46/30 with a mean of 38 mmHg.  Pulmonary wedge pressure was 28 mmHg, cardiac output was 4.62 and cardiac index was 2.21.   Seen in CHF clinic November 25, 2017 Notes indicating still a small amount of smoking and small amount of liquor daily Tested positive for cocaine November 13, 2017   CHF clinic September 16, 2017 Diuresed greater than 10 L on admission November 2018    PMH:   has a past medical history of Acid reflux, Asthma, Chronic systolic CHF (congestive heart failure) (HCC), Hypertension, Noncompliance, Obesity, and Polysubstance abuse (HCC).  PSH:    Past Surgical History:  Procedure Laterality Date  . COLONOSCOPY    . CORONARY ANGIOPLASTY    . ESOPHAGOGASTRODUODENOSCOPY    . RIGHT/LEFT HEART CATH AND CORONARY ANGIOGRAPHY N/A 11/19/2017   Procedure: RIGHT/LEFT HEART CATH AND CORONARY ANGIOGRAPHY;  Surgeon: Iran Ouch, MD;  Location: [facility] INVASIVE CV LAB;  Service: Cardiovascular;  Laterality: N/A;    Current Outpatient Medications  Medication Sig Dispense Refill  . aspirin 81 MG EC tablet Take 1 tablet (81 mg total) by mouth daily. 30 tablet 3  . atorvastatin (LIPITOR) 40 MG tablet Take 1 tablet (40 mg total) by  mouth daily. 30 tablet 3  . carvedilol (COREG) 6.25 MG tablet Take 1 tablet (6.25 mg total) by mouth 2 (two) times daily. 60 tablet 3  . Fluticasone-Umeclidin-Vilant (TRELEGY ELLIPTA) 100-62.5-25 MCG/INH AEPB Inhale 1 puff into the lungs daily.    . folic acid (FOLVITE) 1 MG tablet Take 1 tablet (1 mg total) by mouth daily. 30 tablet 0  . ibuprofen  (ADVIL,MOTRIN) 200 MG tablet Take 200 mg by mouth every 6 (six) hours as needed.    . magnesium oxide (MAG-OX) 400 (241.3 Mg) MG tablet Take 1 tablet (400 mg total) by mouth daily. 30 tablet 3  . sacubitril-valsartan (ENTRESTO) 49-51 MG Take 1 tablet by mouth 2 (two) times daily. 60 tablet 6  . thiamine (VITAMIN B-1) 100 MG tablet Take 100 mg by mouth daily.    . furosemide (LASIX) 40 MG tablet Take 1 tablet (40 mg total) by mouth daily. 30 tablet 6  . potassium chloride SA (K-DUR,KLOR-CON) 20 MEQ tablet Take 1 tablet (20 mEq total) by mouth daily. 30 tablet 6   No current facility-administered medications for this visit.      Allergies:   Patient has no known allergies.   Social History:  The patient  reports that he has been smoking. He has a 7.50 pack-year smoking history. He has never used smokeless tobacco. He reports that he drinks alcohol. He reports that he has current or past drug history. Drug: Cocaine.   Family History:   family history includes Hypertension in his mother.    Review of Systems: Review of Systems  Constitutional: Negative.   Respiratory: Positive for shortness of breath.   Cardiovascular: Negative.   Gastrointestinal: Negative.   Musculoskeletal: Negative.   Neurological: Negative.   Psychiatric/Behavioral: Negative.   All other systems reviewed and are negative.    PHYSICAL EXAM: VS:  BP 118/74 (BP Location: Left Arm, Patient Position: Sitting, Cuff Size: Normal)   Pulse 71   Ht 5\' 10"  (1.778 m)   Wt 177 lb 12 oz (80.6 kg)   BMI 25.50 kg/m  , BMI Body mass index is 25.5 kg/m.  No significant change in exam Constitutional:  oriented to person, place, and time. No distress.  HENT:  Head: Normocephalic and atraumatic.  Eyes:  no discharge. No scleral icterus.  Neck: Normal range of motion. Neck supple. No JVD present.  Cardiovascular: Normal rate, regular rhythm, normal heart sounds and intact distal pulses. Exam reveals no gallop and no friction  rub. No edema No murmur heard. Pulmonary/Chest: Effort normal and breath sounds normal. No stridor. No respiratory distress.  no wheezes.  no rales.  no tenderness.  Abdominal: Soft.  no distension.  no tenderness.  Musculoskeletal: Normal range of motion.  no  tenderness or deformity.  Neurological: Grossly nonfocal Skin: Skin is warm and dry. No rash noted. not diaphoretic.  Psychiatric:  normal mood and affect. behavior is normal. Thought content normal.    Recent Labs: 11/13/2017: B Natriuretic Peptide >4,500.0; TSH 7.739 11/20/2017: Magnesium 1.8 01/13/2018: Hemoglobin 12.2; Platelets 195 03/01/2018: ALT 28; BUN 12; Creatinine, Ser 1.31; Potassium 4.7; Sodium 142    Lipid Panel Lab Results  Component Value Date   CHOL 146 11/14/2017   HDL 78 11/14/2017   LDLCALC 55 11/14/2017   TRIG 65 11/14/2017     Wt Readings from Last 3 Encounters:  08/09/18 177 lb 12 oz (80.6 kg)  03/01/18 183 lb (83 kg)  01/13/18 175 lb (79.4 kg)  ASSESSMENT AND PLAN:  Essential hypertension Blood pressure stable, recommended he restart Lasix daily   Chronic systolic CHF (congestive heart failure) (HCC) - Plan: EKG 12-Lead Still drinking alcohol High risk of recurrent acute systolic CHF Recommend he restart Lasix 40 with potassium 20 daily Was previously on Lasix 40 twice daily with potassium twice a day, this is not on his list and he has not picked this up since February  Substance abuse [facility]) [place] discussion concerning alcohol and smoking Recommended cessation Continues to drink  Alcohol abuse Stressed importance of alcohol cessation  history of alcohol cardiomyopathy Still an active issue on today's visit  Smoker We have encouraged him to continue to work on weaning his cigarettes and smoking cessation. He will continue to work on this and does not want any assistance with chantix.  Again discussed with him  Cocaine abuse (HCC) Stressed importance of cocaine  cessation  Nonischemic cardiomyopathy (HCC) - Plan: EKG 12-Lead  stressed medication compliance, no alcohol, no cocaine  [place] history of no-shows and cancellations for appointments Disposition:   F/U   6 month   Total encounter time more than 25 minutes  Greater than 50% was spent in counseling and coordination of care with the patient    Orders Placed This Encounter  Procedures  . Flu Vaccine QUAD 36+ mos IM  . Basic metabolic panel  . Hepatic function panel  . EKG 12-Lead     Signed, Dossie Arbour, M.D., Ph.D. 08/09/2018  [facility] [place], [place] [phone]

## 2018-08-09 ENCOUNTER — Ambulatory Visit (INDEPENDENT_AMBULATORY_CARE_PROVIDER_SITE_OTHER): Payer: Medicaid Other | Admitting: Cardiovascular Disease

## 2018-08-09 ENCOUNTER — Encounter: Payer: Self-pay | Admitting: Cardiovascular Disease

## 2018-08-09 VITALS — BP 118/74 | HR 71 | Ht 70.0 in | Wt 177.8 lb

## 2018-08-09 DIAGNOSIS — I420 Dilated cardiomyopathy: Secondary | ICD-10-CM | POA: Diagnosis not present

## 2018-08-09 DIAGNOSIS — I5022 Chronic systolic (congestive) heart failure: Secondary | ICD-10-CM

## 2018-08-09 DIAGNOSIS — I2510 Atherosclerotic heart disease of native coronary artery without angina pectoris: Secondary | ICD-10-CM | POA: Diagnosis not present

## 2018-08-09 DIAGNOSIS — Z79899 Other long term (current) drug therapy: Secondary | ICD-10-CM

## 2018-08-09 DIAGNOSIS — Z23 Encounter for immunization: Secondary | ICD-10-CM

## 2018-08-09 DIAGNOSIS — F1010 Alcohol abuse, uncomplicated: Secondary | ICD-10-CM

## 2018-08-09 DIAGNOSIS — I10 Essential (primary) hypertension: Secondary | ICD-10-CM

## 2018-08-09 DIAGNOSIS — F1410 Cocaine abuse, uncomplicated: Secondary | ICD-10-CM

## 2018-08-09 DIAGNOSIS — F1910 Other psychoactive substance abuse, uncomplicated: Secondary | ICD-10-CM

## 2018-08-09 DIAGNOSIS — I428 Other cardiomyopathies: Secondary | ICD-10-CM

## 2018-08-09 MED ORDER — POTASSIUM CHLORIDE CRYS ER 20 MEQ PO TBCR: 20.0000 meq | EXTENDED_RELEASE_TABLET | Freq: Every day | ORAL | 6 refills | Status: DC

## 2018-08-09 MED ORDER — FUROSEMIDE 40 MG PO TABS: 40.0000 mg | ORAL_TABLET | Freq: Every day | ORAL | 6 refills | Status: DC

## 2018-08-09 NOTE — Patient Instructions (Addendum)
Medication Instructions:   1) Please restart lasix (furosemide) 40 mg once a day  2) Restart potassium pill 20 meq once a day  If you need a refill on your cardiac medications before your next appointment, please call your pharmacy.    Lab work: - Your physician recommends that you have lab work today: BMP/ Liver  If you have labs (blood work) drawn today and your tests are completely normal, you will receive your results only by: Marland Kitchen MyChart Message (if you have MyChart) OR . A paper copy in the mail If you have any lab test that is abnormal or we need to change your treatment, we will call you to review the results.   Testing/Procedures: No new testing needed   Follow-Up: At [facility], you and your health needs are our priority.  As part of our continuing mission to provide you with exceptional heart care, we have created designated Provider Care Teams.  These Care Teams include your primary Cardiologist (physician) and Advanced Practice Providers (APPs -  Physician Assistants and Nurse Practitioners) who all work together to provide you with the care you need, when you need it. . You will need a follow up appointment in 6 months with Dr. Mariah Milling   .   Please call our office 2 months in advance to schedule this appointment.    . Providers on your designated Care Team:   . Nicolasa Ducking, NP . Eula Listen, PA-C . Marisue Ivan, PA-C  Any Other Special Instructions Will Be Listed Below (If Applicable).  For educational health videos Log in to : [email or web address] Or : [email or web address], password : [email or web address]

## 2018-08-10 LAB — HEPATIC FUNCTION PANEL
ALK PHOS: 144 IU/L — AB (ref 39–117)
ALT: 127 IU/L — AB (ref 0–44)
AST: 280 IU/L — ABNORMAL HIGH (ref 0–40)
Albumin: 4.6 g/dL (ref 3.5–5.5)
BILIRUBIN, DIRECT: 0.87 mg/dL — AB (ref 0.00–0.40)
Bilirubin Total: 1.5 mg/dL — ABNORMAL HIGH (ref 0.0–1.2)
Total Protein: 7.6 g/dL (ref 6.0–8.5)

## 2018-08-10 LAB — BASIC METABOLIC PANEL
BUN / CREAT RATIO: 14 (ref 9–20)
BUN: 16 mg/dL (ref 6–24)
CHLORIDE: 99 mmol/L (ref 96–106)
CO2: 18 mmol/L — ABNORMAL LOW (ref 20–29)
CREATININE: 1.18 mg/dL (ref 0.76–1.27)
Calcium: 9.3 mg/dL (ref 8.7–10.2)
GFR calc non Af Amer: 67 mL/min/{1.73_m2} (ref >59)
GFR, EST AFRICAN AMERICAN: 78 mL/min/{1.73_m2} (ref >59)
Glucose: 112 mg/dL — ABNORMAL HIGH (ref 65–99)
Potassium: 4.6 mmol/L (ref 3.5–5.2)
Sodium: 141 mmol/L (ref 134–144)

## 2019-01-13 ENCOUNTER — Telehealth: Payer: Self-pay

## 2019-01-13 NOTE — Telephone Encounter (Signed)
Virtual Visit Pre-Appointment Phone Call  Steps For Call:  1. Confirm consent - "In the setting of the current Covid19 crisis, you are scheduled for a TELEPHONE visit with your provider on 02/08/2019 at 9:00AM.  Just as we do with many in-office visits, in order for you to participate in this visit, we must obtain consent.  If you'd like, I can send this to your mychart (if signed up) or email for you to review.  Otherwise, I can obtain your verbal consent now.  All virtual visits are billed to your insurance company just like a normal visit would be.  By agreeing to a virtual visit, we'd like you to understand that the technology does not allow for your provider to perform an examination, and thus may limit your provider's ability to fully assess your condition.  Finally, though the technology is pretty good, we cannot assure that it will always work on either your or our end, and in the setting of a video visit, we may have to convert it to a phone-only visit.  In either situation, we cannot ensure that we have a secure connection.  Are you willing to proceed?"  2. Give patient instructions for WebEx download to smartphone as below if video visit  3. Advise patient to be prepared with any vital sign or heart rhythm information, their current medicines, and a piece of paper and pen handy for any instructions they may receive the day of their visit  4. Inform patient they will receive a phone call 15 minutes prior to their appointment time (may be from unknown caller ID) so they should be prepared to answer  5. Confirm that appointment type is correct in Epic appointment notes (video vs telephone)    TELEPHONE CALL NOTE  Marcus Foster has been deemed a candidate for a follow-up tele-health visit to limit community exposure during the Covid-19 pandemic. I spoke with the patient via phone to ensure availability of phone/video source, confirm preferred email & phone number, and discuss  instructions and expectations.  I reminded [name] to be prepared with any vital sign and/or heart rhythm information that could potentially be obtained via home monitoring, at the time of his visit. I reminded [name] to expect a phone call at the time of his visit if his visit.  Did the patient verbally acknowledge consent to treatment? YES  Marcus Foster, [place] 01/13/2019 12:56 PM  CONSENT FOR TELE-HEALTH VISIT - PLEASE REVIEW  I hereby voluntarily request, consent and authorize [facility] and its employed or contracted physicians, physician assistants, nurse practitioners or other licensed health care professionals (the Practitioner), to provide me with telemedicine health care services (the Services") as deemed necessary by the treating Practitioner. I acknowledge and consent to receive the Services by the Practitioner via telemedicine. I understand that the telemedicine visit will involve communicating with the Practitioner through live audiovisual communication technology and the disclosure of certain medical information by electronic transmission. I acknowledge that I have been given the opportunity to request an in-person assessment or other available alternative prior to the telemedicine visit and am voluntarily participating in the telemedicine visit.  I understand that I have the right to withhold or withdraw my consent to the use of telemedicine in the course of my care at any time, without affecting my right to future care or treatment, and that the Practitioner or I may terminate the telemedicine visit at any time. I understand that I have  the right to inspect all information obtained and/or recorded in the course of the telemedicine visit and may receive copies of available information for a reasonable fee.  I understand that some of the potential risks of receiving the Services via telemedicine include:   Delay or interruption in medical evaluation due to technological  equipment failure or disruption;  Information transmitted may not be sufficient (e.g. poor resolution of images) to allow for appropriate medical decision making by the Practitioner; and/or   In rare instances, security protocols could fail, causing a breach of personal health information.  Furthermore, I acknowledge that it is my responsibility to provide information about my medical history, conditions and care that is complete and accurate to the best of my ability. I acknowledge that Practitioner's advice, recommendations, and/or decision may be based on factors not within their control, such as incomplete or inaccurate data provided by me or distortions of diagnostic images or specimens that may result from electronic transmissions. I understand that the practice of medicine is not an exact science and that Practitioner makes no warranties or guarantees regarding treatment outcomes. I acknowledge that I will receive a copy of this consent concurrently upon execution via email to the email address I last provided but may also request a printed copy by calling the office of [facility].    I understand that my insurance will be billed for this visit.   I have read or had this consent read to me.  I understand the contents of this consent, which adequately explains the benefits and risks of the Services being provided via telemedicine.   I have been provided ample opportunity to ask questions regarding this consent and the Services and have had my questions answered to my satisfaction.  I give my informed consent for the services to be provided through the use of telemedicine in my medical care  By participating in this telemedicine visit I agree to the above.

## 2019-01-13 NOTE — Telephone Encounter (Signed)
° °  Patient agreeable to a TELE visit on 4/28

## 2019-01-13 NOTE — Telephone Encounter (Signed)
Perfect, Thank you!

## 2019-01-13 NOTE — Telephone Encounter (Signed)
Called patient to make him aware that Dr. Mariah Milling was limiting patients in the office. Needs to change to video or telephone visit.  No answer. No voicemail

## 2019-02-07 NOTE — Progress Notes (Signed)
Virtual Visit via Telephone Note   This visit type was conducted due to national recommendations for restrictions regarding the COVID-19 Pandemic (e.g. social distancing) in an effort to limit this patient's exposure and mitigate transmission in our community.  Due to his co-morbid illnesses, this patient is at least at moderate risk for complications without adequate follow up.  This format is felt to be most appropriate for this patient at this time.  The patient did not have access to video technology/had technical difficulties with video requiring transitioning to audio format only (telephone).  All issues noted in this document were discussed and addressed.  No physical exam could be performed with this format.  Please refer to the patient's chart for his  consent to telehealth for [facility].   I connected with  Marcus Foster on 02/07/19 by a video enabled telemedicine application and verified that I am speaking with the correct person using two identifiers. I discussed the limitations of evaluation and management by telemedicine. The patient expressed understanding and agreed to proceed.   Evaluation Performed:  Follow-up visit  Date:  02/07/2019   ID:  Marcus Foster, DOB [birth year], MRN [id number]  Patient Location:  [address] [place] [address]   Provider location:   [facility], [place] office  PCP:  Armando Gang, FNP  Cardiologist:  Hubbard Robinson [facility]   Chief Complaint: Mild shortness of breath in the morning    History of Present Illness:    Marcus Foster is a 58 y.o. male who presents via audio/video conferencing for a telehealth visit today.   The patient does not symptoms concerning for COVID-19 infection (fever, chills, cough, or new SHORTNESS OF BREATH).   Patient has a past medical history of childhood asthma,  esophageal stricture, dysphagia, diverticulosis,  heavy alcohol abuse,  chronic tobacco smoking abuse,  chronic cocaine  abuse, recent abuse Presenting to the hospital November 2018 with worsening cough, shortness of breath, dyspnea on exertion,  alcohol level 270 Diagnosed with dilated cardiomyopathy ejection fraction 25-30% felt secondary to alcohol abuse, chronic hypertension, nonischemic Millar history of noncompliance, missing his Lasix on weekends High salt diet Presenting for routine follow-up of his cardiomyopathy, moderate mitral valve regurg   Daughter does the shopping Feels good, Little SOB in the morning, this seems to get better without intervention No edema Shoes and socks ok, fit fine with no swelling [facility], has the meds In general reports medication compliance  LFTs elevated, 07/2018 Unclear etiology Reports he had follow-up with primary care and has had lab work done since then He reports that he is supposed to have some imaging done of his abdomen  etoh daily, 1-2 shots No regular exercise program   Past medical history reviewed  In the hospital 11/2017 CHF admitted 2/1 with acute on chronic systolic CHF in the setting of noncompliance, ETOH abuse. Has severe leg and scrotal edema, shortness of breath Was not taking lasix 40 BID  Tested positive for cocaine November 13, 2017   CHF clinic September 16, 2017 Diuresed greater than 10 L on admission November 2018   Prior CV studies:   The following studies were reviewed today:  Echo 08/2017 EF 20 to 25%, RV mod reduced systolic function  Cardiac cath 11/19/2017  Prox LAD lesion is 60% stenosed.  Dist LAD lesion is 40% stenosed.  Mid RCA lesion is 40% stenosed.  There is severe left ventricular systolic dysfunction.  LV end diastolic  pressure is severely elevated.  1. Mild to moderate nonobstructive coronary artery disease with heavily calcified vessels. The worst stenosis is 60% in the proximal LAD. 2. Severely reduced LV systolic function with an EF of 15% and global hypokinesis.  3. Right heart  catheterization showed moderately to severely elevated filling pressures, moderate pulmonary hypertension and mildly reduced cardiac output. RA pressure: 4 mmHg, RV pressure is 51 over 11 mmHg, PA pressure was 46/30 with a mean of 38 mmHg. Pulmonary wedge pressure was 28 mmHg, cardiac output was 4.62 and cardiac index was 2.21.   Past Medical History:  Diagnosis Date  . Acid reflux   . Asthma   . Chronic systolic CHF (congestive heart failure) (HCC)    a. TTE 08/2017: EF 20-25%, moderate LVH, unable to exclude RWMA, mild to moderate MR, moderately dilated LV, mildly dilated RV with moderately reduced RVSF, mildly dilated RA, trivial pericardial effusion  . Hypertension   . Noncompliance   . Obesity   . Polysubstance abuse (HCC)    a. ongoing cocaine, tobacco, and alcohol abuse   Past Surgical History:  Procedure Laterality Date  . COLONOSCOPY    . CORONARY ANGIOPLASTY    . ESOPHAGOGASTRODUODENOSCOPY    . RIGHT/LEFT HEART CATH AND CORONARY ANGIOGRAPHY N/A 11/19/2017   Procedure: RIGHT/LEFT HEART CATH AND CORONARY ANGIOGRAPHY;  Surgeon: Iran OuchArida, Muhammad A, MD;  Location: [facility] INVASIVE CV LAB;  Service: Cardiovascular;  Laterality: N/A;     No outpatient medications have been marked as taking for the 02/08/19 encounter (Appointment) with Antonieta IbaGollan,  J, MD.     Allergies:   Patient has no known allergies.   Social History   Tobacco Use  . Smoking status: Current Every Day Smoker    Packs/day: 0.25    Years: 30.00    Pack years: 7.50  . Smokeless tobacco: Never Used  Substance Use Topics  . Alcohol use: Yes    Comment: "couple shots a day" and beer  . Drug use: Yes    Types: Cocaine     Current Outpatient Medications on File Prior to Visit  Medication Sig Dispense Refill  . aspirin 81 MG EC tablet Take 1 tablet (81 mg total) by mouth daily. 30 tablet 3  . atorvastatin (LIPITOR) 40 MG tablet Take 1 tablet (40 mg total) by mouth daily. 30 tablet 3  . carvedilol (COREG)  6.25 MG tablet Take 1 tablet (6.25 mg total) by mouth 2 (two) times daily. 60 tablet 3  . Fluticasone-Umeclidin-Vilant (TRELEGY ELLIPTA) 100-62.5-25 MCG/INH AEPB Inhale 1 puff into the lungs daily.    . folic acid (FOLVITE) 1 MG tablet Take 1 tablet (1 mg total) by mouth daily. 30 tablet 0  . furosemide (LASIX) 40 MG tablet Take 1 tablet (40 mg total) by mouth daily. 30 tablet 6  . ibuprofen (ADVIL,MOTRIN) 200 MG tablet Take 200 mg by mouth every 6 (six) hours as needed.    . magnesium oxide (MAG-OX) 400 (241.3 Mg) MG tablet Take 1 tablet (400 mg total) by mouth daily. 30 tablet 3  . potassium chloride SA (K-DUR,KLOR-CON) 20 MEQ tablet Take 1 tablet (20 mEq total) by mouth daily. 30 tablet 6  . sacubitril-valsartan (ENTRESTO) 49-51 MG Take 1 tablet by mouth 2 (two) times daily. 60 tablet 6  . thiamine (VITAMIN B-1) 100 MG tablet Take 100 mg by mouth daily.     No current facility-administered medications on file prior to visit.      Family Hx: The patient's family history  includes Hypertension in his mother.  ROS:   Please see the history of present illness.    Review of Systems  Constitutional: Negative.   Respiratory: Negative.   Cardiovascular: Negative.   Gastrointestinal: Negative.   Musculoskeletal: Negative.   Neurological: Negative.   Psychiatric/Behavioral: Negative.   All other systems reviewed and are negative.     Labs/Other Tests and Data Reviewed:    Recent Labs: 08/09/2018: ALT 127; BUN 16; Creatinine, Ser 1.18; Potassium 4.6; Sodium 141   Recent Lipid Panel Lab Results  Component Value Date/Time   CHOL 146 11/14/2017 06:02 AM   TRIG 65 11/14/2017 06:02 AM   HDL 78 11/14/2017 06:02 AM   CHOLHDL 1.9 11/14/2017 06:02 AM   LDLCALC 55 11/14/2017 06:02 AM    Wt Readings from Last 3 Encounters:  08/09/18 177 lb 12 oz (80.6 kg)  03/01/18 183 lb (83 kg)  01/13/18 175 lb (79.4 kg)     Exam:    Vital Signs: Vital signs may also be detailed in the HPI There  were no vitals taken for this visit.  Wt Readings from Last 3 Encounters:  08/09/18 177 lb 12 oz (80.6 kg)  03/01/18 183 lb (83 kg)  01/13/18 175 lb (79.4 kg)   Temp Readings from Last 3 Encounters:  01/18/18 (!) 96.9 F (36.1 C) (Tympanic)  11/25/17 98.6 F (37 C) (Oral)  11/21/17 (!) 97.5 F (36.4 C) (Oral)   BP Readings from Last 3 Encounters:  08/09/18 118/74  03/01/18 122/80  01/18/18 (!) 134/94   Pulse Readings from Last 3 Encounters:  08/09/18 71  03/01/18 81  01/18/18 98    120/80, pulse 70, resp 16  Well nourished, well developed male in no acute distress. Constitutional:  oriented to person, place, and time. No distress.     ASSESSMENT & PLAN:    Dilated cardiomyopathy (HCC) Foster history of nonischemic cardiomyopathy, heavy alcohol abuse and substance abuse Nonobstructive coronary disease by catheterization 2019 No further ischemic work-up needed Encouraged him to continue his current medications By his account, he is euvolemic  Chronic systolic CHF (congestive heart failure) (HCC) As above, Marcus Foster history of nonischemic cardiomyopathy Prior history medication noncompliance leading to CHF exacerbation Currently appears to be medication compliant, stable  Coronary artery disease, non-occlusive Recommended smoking cessation Cholesterol at goal  Essential hypertension Blood pressure is well controlled on today's visit. No changes made to the medications.  Cocaine abuse (HCC) Periodically testing positive over the past several years Cessation recommended  Alcohol abuse Reports he continues to drink on a daily basis several shots Some days with heavy alcohol Discussed the ramifications of chronic alcohol use/abuse  Smoker We have encouraged him to continue to work on weaning his cigarettes and smoking cessation. He will continue to work on this and does not want any assistance with chantix.   Transaminitis Elevated numbers October 2019 Reports he  has had follow-up with primary care and was scheduled to have imaging of his abdomen but has not done this yet Will try to obtain follow-up lab work done through primary care to make sure numbers are trending down Encouraged him to complete his imaging study   COVID-19 Education: The signs and symptoms of COVID-19 were discussed with the patient and how to seek care for testing (follow up with PCP or arrange E-visit).  The importance of social distancing was discussed today.  Patient Risk:   After full review of this patients clinical status, I feel that they are at least  moderate risk at this time.   Time:   Today, I have spent 25 minutes with the patient with telehealth technology discussing the cardiac and medical problems/diagnoses detailed above   10 min spent reviewing the chart prior to patient visit today   Medication Adjustments/Labs and Tests Ordered: Current medicines are reviewed at length with the patient today.  Concerns regarding medicines are outlined above.   Tests Ordered: No tests ordered   Medication Changes: No changes made   Disposition: Follow-up in 6 months   Signed, Julien Nordmann, MD  02/07/2019 5:48 PM    [facility] [address], [place], [place] [address]

## 2019-02-08 ENCOUNTER — Telehealth (INDEPENDENT_AMBULATORY_CARE_PROVIDER_SITE_OTHER): Payer: Medicaid Other | Admitting: Cardiovascular Disease

## 2019-02-08 ENCOUNTER — Other Ambulatory Visit: Payer: Self-pay

## 2019-02-08 ENCOUNTER — Telehealth: Payer: Self-pay | Admitting: Cardiovascular Disease

## 2019-02-08 DIAGNOSIS — F1410 Cocaine abuse, uncomplicated: Secondary | ICD-10-CM

## 2019-02-08 DIAGNOSIS — F1010 Alcohol abuse, uncomplicated: Secondary | ICD-10-CM | POA: Diagnosis not present

## 2019-02-08 DIAGNOSIS — F17200 Nicotine dependence, unspecified, uncomplicated: Secondary | ICD-10-CM

## 2019-02-08 DIAGNOSIS — I420 Dilated cardiomyopathy: Secondary | ICD-10-CM

## 2019-02-08 DIAGNOSIS — I2510 Atherosclerotic heart disease of native coronary artery without angina pectoris: Secondary | ICD-10-CM

## 2019-02-08 DIAGNOSIS — I10 Essential (primary) hypertension: Secondary | ICD-10-CM

## 2019-02-08 DIAGNOSIS — I5022 Chronic systolic (congestive) heart failure: Secondary | ICD-10-CM | POA: Diagnosis not present

## 2019-02-08 MED ORDER — SACUBITRIL-VALSARTAN 49-51 MG PO TABS: 1.0000 | ORAL_TABLET | Freq: Two times a day (BID) | ORAL | 11 refills | Status: AC

## 2019-02-08 MED ORDER — ATORVASTATIN CALCIUM 40 MG PO TABS: 40.0000 mg | ORAL_TABLET | Freq: Every day | ORAL | 3 refills | Status: AC

## 2019-02-08 MED ORDER — CARVEDILOL 6.25 MG PO TABS: 6.2500 mg | ORAL_TABLET | Freq: Two times a day (BID) | ORAL | 3 refills | Status: AC

## 2019-02-08 MED ORDER — POTASSIUM CHLORIDE CRYS ER 20 MEQ PO TBCR: 20.0000 meq | EXTENDED_RELEASE_TABLET | Freq: Every day | ORAL | 2 refills | Status: AC

## 2019-02-08 MED ORDER — FUROSEMIDE 40 MG PO TABS: 40.0000 mg | ORAL_TABLET | Freq: Every day | ORAL | 3 refills | Status: AC

## 2019-02-08 NOTE — Patient Instructions (Addendum)
LABS done on 11/24/2018 Emailed to Dr. Mariah Milling for review.  Medication Instructions:  No changes  If you need a refill on your cardiac medications before your next appointment, please call your pharmacy.    Lab work: We need liver function panel done. Please go to [facility] Entrance and check in at the front desk. They will direct you where to go from there. No appointment is needed and you can go in anytime between 08:00 AM to 5:00 PM. Please let us know if you have any questions.    If you have labs (blood work) drawn today and your tests are completely normal, you will receive your results only by: Marland Kitchen MyChart Message (if you have MyChart) OR . A paper copy in the mail If you have any lab test that is abnormal or we need to change your treatment, we will call you to review the results.   Testing/Procedures: No new testing needed   Follow-Up: At [facility], you and your health needs are our priority.  As part of our continuing mission to provide you with exceptional heart care, we have created designated Provider Care Teams.  These Care Teams include your primary Cardiologist (physician) and Advanced Practice Providers (APPs -  Physician Assistants and Nurse Practitioners) who all work together to provide you with the care you need, when you need it.  . You will need a follow up appointment in 6 months .   Please call our office 2 months in advance to schedule this appointment.    . Providers on your designated Care Team:   . Nicolasa Ducking, NP . Eula Listen, PA-C . Marisue Ivan, PA-C  Any Other Special Instructions Will Be Listed Below (If Applicable).  For educational health videos Log in to : [email or web address] Or : [email or web address], password : [email or web address]

## 2019-02-08 NOTE — Telephone Encounter (Signed)
Spoke with patient and reviewed that he needs labs to be done this week. Instructed him to go to [facility] and check in at the first desk and they will direct him where to go. He verbalized understanding, was agreeable to plan, and had no further questions at this time.

## 2019-06-10 ENCOUNTER — Emergency Department: Payer: Medicaid Other

## 2019-06-10 ENCOUNTER — Inpatient Hospital Stay
Admission: EM | Admit: 2019-06-10 | Discharge: 2019-06-14 | DRG: 917 | Disposition: E | Payer: Medicaid Other | Attending: Internal Medicine | Admitting: Internal Medicine

## 2019-06-10 DIAGNOSIS — A419 Sepsis, unspecified organism: Secondary | ICD-10-CM | POA: Diagnosis present

## 2019-06-10 DIAGNOSIS — K859 Acute pancreatitis without necrosis or infection, unspecified: Secondary | ICD-10-CM | POA: Diagnosis present

## 2019-06-10 DIAGNOSIS — Z79899 Other long term (current) drug therapy: Secondary | ICD-10-CM

## 2019-06-10 DIAGNOSIS — E1122 Type 2 diabetes mellitus with diabetic chronic kidney disease: Secondary | ICD-10-CM | POA: Diagnosis present

## 2019-06-10 DIAGNOSIS — J9602 Acute respiratory failure with hypercapnia: Secondary | ICD-10-CM | POA: Diagnosis present

## 2019-06-10 DIAGNOSIS — Z20828 Contact with and (suspected) exposure to other viral communicable diseases: Secondary | ICD-10-CM | POA: Diagnosis present

## 2019-06-10 DIAGNOSIS — E1165 Type 2 diabetes mellitus with hyperglycemia: Secondary | ICD-10-CM | POA: Diagnosis present

## 2019-06-10 DIAGNOSIS — E11649 Type 2 diabetes mellitus with hypoglycemia without coma: Secondary | ICD-10-CM | POA: Diagnosis present

## 2019-06-10 DIAGNOSIS — E869 Volume depletion, unspecified: Secondary | ICD-10-CM | POA: Diagnosis present

## 2019-06-10 DIAGNOSIS — E872 Acidosis: Secondary | ICD-10-CM | POA: Diagnosis present

## 2019-06-10 DIAGNOSIS — D649 Anemia, unspecified: Secondary | ICD-10-CM | POA: Diagnosis present

## 2019-06-10 DIAGNOSIS — K7030 Alcoholic cirrhosis of liver without ascites: Secondary | ICD-10-CM | POA: Diagnosis present

## 2019-06-10 DIAGNOSIS — Z7951 Long term (current) use of inhaled steroids: Secondary | ICD-10-CM

## 2019-06-10 DIAGNOSIS — F1410 Cocaine abuse, uncomplicated: Secondary | ICD-10-CM | POA: Diagnosis present

## 2019-06-10 DIAGNOSIS — J189 Pneumonia, unspecified organism: Secondary | ICD-10-CM | POA: Diagnosis present

## 2019-06-10 DIAGNOSIS — I130 Hypertensive heart and chronic kidney disease with heart failure and stage 1 through stage 4 chronic kidney disease, or unspecified chronic kidney disease: Secondary | ICD-10-CM | POA: Diagnosis present

## 2019-06-10 DIAGNOSIS — N179 Acute kidney failure, unspecified: Secondary | ICD-10-CM

## 2019-06-10 DIAGNOSIS — K219 Gastro-esophageal reflux disease without esophagitis: Secondary | ICD-10-CM | POA: Diagnosis present

## 2019-06-10 DIAGNOSIS — Z8249 Family history of ischemic heart disease and other diseases of the circulatory system: Secondary | ICD-10-CM

## 2019-06-10 DIAGNOSIS — I5023 Acute on chronic systolic (congestive) heart failure: Secondary | ICD-10-CM | POA: Diagnosis present

## 2019-06-10 DIAGNOSIS — Z66 Do not resuscitate: Secondary | ICD-10-CM | POA: Diagnosis not present

## 2019-06-10 DIAGNOSIS — J440 Chronic obstructive pulmonary disease with acute lower respiratory infection: Secondary | ICD-10-CM | POA: Diagnosis present

## 2019-06-10 DIAGNOSIS — T405X1A Poisoning by cocaine, accidental (unintentional), initial encounter: Principal | ICD-10-CM | POA: Diagnosis present

## 2019-06-10 DIAGNOSIS — K222 Esophageal obstruction: Secondary | ICD-10-CM | POA: Diagnosis present

## 2019-06-10 DIAGNOSIS — N183 Chronic kidney disease, stage 3 (moderate): Secondary | ICD-10-CM | POA: Diagnosis present

## 2019-06-10 DIAGNOSIS — T68XXXA Hypothermia, initial encounter: Secondary | ICD-10-CM

## 2019-06-10 DIAGNOSIS — Z6825 Body mass index (BMI) 25.0-25.9, adult: Secondary | ICD-10-CM

## 2019-06-10 DIAGNOSIS — E873 Alkalosis: Secondary | ICD-10-CM | POA: Diagnosis present

## 2019-06-10 DIAGNOSIS — Z9119 Patient's noncompliance with other medical treatment and regimen: Secondary | ICD-10-CM

## 2019-06-10 DIAGNOSIS — I420 Dilated cardiomyopathy: Secondary | ICD-10-CM | POA: Diagnosis present

## 2019-06-10 DIAGNOSIS — N170 Acute kidney failure with tubular necrosis: Secondary | ICD-10-CM | POA: Diagnosis present

## 2019-06-10 DIAGNOSIS — F17210 Nicotine dependence, cigarettes, uncomplicated: Secondary | ICD-10-CM | POA: Diagnosis present

## 2019-06-10 DIAGNOSIS — J9600 Acute respiratory failure, unspecified whether with hypoxia or hypercapnia: Secondary | ICD-10-CM | POA: Diagnosis present

## 2019-06-10 DIAGNOSIS — K7041 Alcoholic hepatic failure with coma: Secondary | ICD-10-CM | POA: Diagnosis present

## 2019-06-10 DIAGNOSIS — J9601 Acute respiratory failure with hypoxia: Secondary | ICD-10-CM | POA: Diagnosis present

## 2019-06-10 DIAGNOSIS — E162 Hypoglycemia, unspecified: Secondary | ICD-10-CM

## 2019-06-10 DIAGNOSIS — E43 Unspecified severe protein-calorie malnutrition: Secondary | ICD-10-CM | POA: Diagnosis present

## 2019-06-10 DIAGNOSIS — K7010 Alcoholic hepatitis without ascites: Secondary | ICD-10-CM | POA: Diagnosis present

## 2019-06-10 DIAGNOSIS — Z7982 Long term (current) use of aspirin: Secondary | ICD-10-CM

## 2019-06-10 DIAGNOSIS — R6521 Severe sepsis with septic shock: Secondary | ICD-10-CM | POA: Diagnosis present

## 2019-06-10 DIAGNOSIS — G9341 Metabolic encephalopathy: Secondary | ICD-10-CM | POA: Diagnosis present

## 2019-06-10 DIAGNOSIS — K7201 Acute and subacute hepatic failure with coma: Secondary | ICD-10-CM

## 2019-06-10 DIAGNOSIS — Z9861 Coronary angioplasty status: Secondary | ICD-10-CM

## 2019-06-10 HISTORY — DX: Dysphagia, unspecified: R13.10

## 2019-06-10 HISTORY — DX: Candidal esophagitis: B37.81

## 2019-06-10 HISTORY — DX: Esophageal obstruction: K22.2

## 2019-06-10 HISTORY — DX: Unspecified osteoarthritis, unspecified site: M19.90

## 2019-06-10 LAB — CBC WITH DIFFERENTIAL/PLATELET
Abs Immature Granulocytes: 0.08 10*3/uL — ABNORMAL HIGH (ref 0.00–0.07)
Basophils Absolute: 0 10*3/uL (ref 0.0–0.1)
Basophils Relative: 0 %
Eosinophils Absolute: 0.1 10*3/uL (ref 0.0–0.5)
Eosinophils Relative: 1 %
HCT: 24.5 % — ABNORMAL LOW (ref 39.0–52.0)
Hemoglobin: 8 g/dL — ABNORMAL LOW (ref 13.0–17.0)
Immature Granulocytes: 1 %
Lymphocytes Relative: 4 %
Lymphs Abs: 0.6 10*3/uL — ABNORMAL LOW (ref 0.7–4.0)
MCH: 31.6 pg (ref 26.0–34.0)
MCHC: 32.7 g/dL (ref 30.0–36.0)
MCV: 96.8 fL (ref 80.0–100.0)
Monocytes Absolute: 0.7 10*3/uL (ref 0.1–1.0)
Monocytes Relative: 5 %
Neutro Abs: 11 10*3/uL — ABNORMAL HIGH (ref 1.7–7.7)
Neutrophils Relative %: 89 %
Platelets: 56 10*3/uL — ABNORMAL LOW (ref 150–400)
RBC: 2.53 MIL/uL — ABNORMAL LOW (ref 4.22–5.81)
RDW: 15.8 % — ABNORMAL HIGH (ref 11.5–15.5)
WBC: 12.4 10*3/uL — ABNORMAL HIGH (ref 4.0–10.5)

## 2019-06-10 LAB — BLOOD GAS, ARTERIAL
Acid-base deficit: 17.6 mmol/L — ABNORMAL HIGH (ref 0.0–2.0)
Bicarbonate: 9.6 mmol/L — ABNORMAL LOW (ref 20.0–28.0)
FIO2: 0.45
MECHVT: 500 mL
O2 Saturation: 99.5 %
PEEP: 5 cmH2O
Patient temperature: 37
RATE: 18 resp/min
pCO2 arterial: 27 mmHg — ABNORMAL LOW (ref 32.0–48.0)
pH, Arterial: 7.16 — CL (ref 7.350–7.450)
pO2, Arterial: 208 mmHg — ABNORMAL HIGH (ref 83.0–108.0)

## 2019-06-10 LAB — COMPREHENSIVE METABOLIC PANEL
ALT: 74 U/L — ABNORMAL HIGH (ref 0–44)
AST: 214 U/L — ABNORMAL HIGH (ref 15–41)
Albumin: 1.8 g/dL — ABNORMAL LOW (ref 3.5–5.0)
Alkaline Phosphatase: 140 U/L — ABNORMAL HIGH (ref 38–126)
Anion gap: 27 — ABNORMAL HIGH (ref 5–15)
BUN: 30 mg/dL — ABNORMAL HIGH (ref 6–20)
CO2: 10 mmol/L — ABNORMAL LOW (ref 22–32)
Calcium: 7.9 mg/dL — ABNORMAL LOW (ref 8.9–10.3)
Chloride: 96 mmol/L — ABNORMAL LOW (ref 98–111)
Creatinine, Ser: 4.95 mg/dL — ABNORMAL HIGH (ref 0.61–1.24)
GFR calc Af Amer: 14 mL/min — ABNORMAL LOW (ref >60)
GFR calc non Af Amer: 12 mL/min — ABNORMAL LOW (ref >60)
Glucose, Bld: <20 mg/dL — CL (ref 70–99)
Potassium: 4.7 mmol/L (ref 3.5–5.1)
Sodium: 133 mmol/L — ABNORMAL LOW (ref 135–145)
Total Bilirubin: 6.9 mg/dL — ABNORMAL HIGH (ref 0.3–1.2)
Total Protein: 6.5 g/dL (ref 6.5–8.1)

## 2019-06-10 LAB — BLOOD GAS, VENOUS
Acid-base deficit: 15.2 mmol/L — ABNORMAL HIGH (ref 0.0–2.0)
Bicarbonate: 9.6 mmol/L — ABNORMAL LOW (ref 20.0–28.0)
O2 Saturation: 99.3 %
Patient temperature: 37
pCO2, Ven: 21 mmHg — ABNORMAL LOW (ref 44.0–60.0)
pH, Ven: 7.27 (ref 7.250–7.430)
pO2, Ven: 165 mmHg — ABNORMAL HIGH (ref 32.0–45.0)

## 2019-06-10 LAB — ETHANOL: Alcohol, Ethyl (B): <10 mg/dL (ref <10)

## 2019-06-10 LAB — GLUCOSE, CAPILLARY
Glucose-Capillary: 142 mg/dL — ABNORMAL HIGH (ref 70–99)
Glucose-Capillary: 428 mg/dL — ABNORMAL HIGH (ref 70–99)

## 2019-06-10 LAB — PROTIME-INR
INR: 6.5 (ref 0.8–1.2)
Prothrombin Time: 55.9 seconds — ABNORMAL HIGH (ref 11.4–15.2)

## 2019-06-10 LAB — LACTIC ACID, PLASMA: Lactic Acid, Venous: >11 mmol/L (ref 0.5–1.9)

## 2019-06-10 LAB — TROPONIN I (HIGH SENSITIVITY): Troponin I (High Sensitivity): 64 ng/L — ABNORMAL HIGH (ref <18)

## 2019-06-10 MED ORDER — SODIUM BICARBONATE 8.4 % IV SOLN
50.0000 meq | Freq: Once | INTRAVENOUS | Status: AC
  Administered 2019-06-10: 50 meq via INTRAVENOUS
  Filled 2019-06-10: qty 50

## 2019-06-10 MED ORDER — LORAZEPAM 2 MG/ML IJ SOLN
2.0000 mg | Freq: Once | INTRAMUSCULAR | Status: AC
  Administered 2019-06-10: 23:00:00 2 mg via INTRAVENOUS

## 2019-06-10 MED ORDER — NOREPINEPHRINE 4 MG/250ML-% IV SOLN
0.0000 ug/min | INTRAVENOUS | Status: DC
  Administered 2019-06-10: 23:00:00 8 ug/min via INTRAVENOUS
  Filled 2019-06-10: qty 250

## 2019-06-10 MED ORDER — SODIUM CHLORIDE 0.9 % IV SOLN: 10.0000 mL/h | Freq: Once | INTRAVENOUS | Status: DC

## 2019-06-10 MED ORDER — SODIUM CHLORIDE 0.9 % IV SOLN
0.5000 mg/h | INTRAVENOUS | Status: DC
  Filled 2019-06-10: qty 10

## 2019-06-10 MED ORDER — DEXTROSE 5 % IN LACTATED RINGERS IV BOLUS
1000.0000 mL | Freq: Once | INTRAVENOUS | Status: AC
  Administered 2019-06-10: 22:00:00 1000 mL via INTRAVENOUS
  Filled 2019-06-10: qty 1000

## 2019-06-10 MED ORDER — NOREPINEPHRINE 4 MG/250ML-% IV SOLN
INTRAVENOUS | Status: AC
  Administered 2019-06-10: 8 ug/min via INTRAVENOUS
  Filled 2019-06-10: qty 250

## 2019-06-10 MED ORDER — VECURONIUM BROMIDE 10 MG IV SOLR
INTRAVENOUS | Status: AC | PRN
  Administered 2019-06-10: 10 mg via INTRAVENOUS

## 2019-06-10 MED ORDER — SUCCINYLCHOLINE CHLORIDE 20 MG/ML IJ SOLN
INTRAMUSCULAR | Status: AC | PRN
  Administered 2019-06-10: 100 mg via INTRAVENOUS

## 2019-06-10 MED ORDER — LORAZEPAM 2 MG PO TABS: 2.0000 mg | ORAL_TABLET | Freq: Once | ORAL | Status: DC

## 2019-06-10 MED ORDER — ETOMIDATE 2 MG/ML IV SOLN
INTRAVENOUS | Status: AC | PRN
  Administered 2019-06-10: 20 mg via INTRAVENOUS

## 2019-06-10 MED ORDER — SODIUM CHLORIDE 0.9 % IV SOLN
10.0000 mL/h | Freq: Once | INTRAVENOUS | Status: AC
  Administered 2019-06-10: 10 mL/h via INTRAVENOUS

## 2019-06-10 MED ORDER — LORAZEPAM 2 MG/ML IJ SOLN
INTRAMUSCULAR | Status: AC
  Administered 2019-06-10: 23:00:00 2 mg via INTRAVENOUS
  Filled 2019-06-10: qty 1

## 2019-06-10 MED ORDER — MIDAZOLAM 50MG/50ML (1MG/ML) PREMIX INFUSION
0.5000 mg/h | INTRAVENOUS | Status: DC
  Administered 2019-06-11: 0.5 mg/h via INTRAVENOUS
  Filled 2019-06-10: qty 50

## 2019-06-10 MED ORDER — MIDAZOLAM 50MG/50ML (1MG/ML) PREMIX INFUSION
0.5000 mg/h | INTRAVENOUS | Status: DC
  Filled 2019-06-10: qty 50

## 2019-06-10 NOTE — ED Notes (Signed)
CRITICAL LAB: LACTATE is 11.0, ROBIN  Lab, Dr. Jimmye Norman notified, orders received

## 2019-06-10 NOTE — ED Triage Notes (Signed)
.  Patient presents to Emergency Department via [facility] from home  with complaints of found unresponsive   History of DM NO hx of hypoglycemic LOC

## 2019-06-10 NOTE — ED Provider Notes (Addendum)
[facility] Emergency Department Provider Note       Time seen: ----------------------------------------- 9:38 PM on 05/30/2019 ----------------------------------------- Level V caveat: History/ROS limited by altered mental status I have reviewed the triage vital signs and the nursing notes.  HISTORY   Chief Complaint Hypoglycemia and Loss of Consciousness    HPI Marcus Foster is a 58 y.o. male with a history of acid reflux, asthma, CHF, hypertension, polysubstance abuse who presents to the ED for altered mental status and hypoglycemia.  Patient presents emergency traffic to the ER after he was found unresponsive at home.  He has a history of diabetes but has never been hypoglycemic.  Past Medical History:  Diagnosis Date  . Acid reflux   . Asthma   . Chronic systolic CHF (congestive heart failure) ([place])    a. TTE 08/2017: EF 20-25%, moderate LVH, unable to exclude RWMA, mild to moderate MR, moderately dilated LV, mildly dilated RV with moderately reduced RVSF, mildly dilated RA, trivial pericardial effusion  . Hypertension   . Noncompliance   . Obesity   . Polysubstance abuse ([place])    a. ongoing cocaine, tobacco, and alcohol abuse    Patient Active Problem List   Diagnosis Date Noted  . Acute on chronic systolic CHF (congestive heart failure) ([place]) 11/13/2017  . Chronic systolic CHF (congestive heart failure) ([place]) 10/24/2017  . Alcohol abuse 10/24/2017  . Smoker 10/24/2017  . Cocaine abuse ([place]) 10/24/2017  . Nonischemic cardiomyopathy ([place]) 10/24/2017  . HTN (hypertension) 09/16/2017  . Substance abuse ([place]) 09/16/2017  . Lymphedema 09/16/2017    Past Surgical History:  Procedure Laterality Date  . COLONOSCOPY    . CORONARY ANGIOPLASTY    . ESOPHAGOGASTRODUODENOSCOPY    . RIGHT/LEFT HEART CATH AND CORONARY ANGIOGRAPHY N/A 11/19/2017   Procedure: RIGHT/LEFT HEART CATH AND CORONARY ANGIOGRAPHY;  Surgeon: Wellington Hampshire, MD;  Location: [facility];  Service: Cardiovascular;  Laterality: N/A;    Allergies Patient has no known allergies.  Social History Social History   Tobacco Use  . Smoking status: Current Every Day Smoker    Packs/day: 0.25    Years: 30.00    Pack years: 7.50  . Smokeless tobacco: Never Used  Substance Use Topics  . Alcohol use: Yes    Comment: "couple shots a day" and beer  . Drug use: Yes    Types: Cocaine   Review of Systems Unknown, patient arrives unresponsive  All systems negative/normal/unremarkable except as stated in the HPI  ____________________________________________   PHYSICAL EXAM:  VITAL SIGNS: ED Triage Vitals  Enc Vitals Group     BP      Pulse      Resp      Temp      Temp src      SpO2      Weight      Height      Head Circumference      Peak Flow      Pain Score      Pain Loc      Pain Edu?      Excl. in [place]?     Constitutional: Lethargic, disoriented, localizes to pain Eyes: Conjunctivae are icteric. Normal extraocular movements. ENT      Head: Normocephalic and atraumatic.      Nose: No congestion/rhinnorhea.      Mouth/Throat: Mucous membranes are moist.      Neck: No stridor. Cardiovascular: Normal rate, regular rhythm. No murmurs, rubs, or gallops.  Respiratory: Normal respiratory effort without tachypnea nor retractions. Breath sounds are clear and equal bilaterally. No wheezes/rales/rhonchi. Gastrointestinal: Soft and nontender. Normal bowel sounds Musculoskeletal: Nontender with normal range of motion in extremities. No lower extremity tenderness nor edema. Neurologic: Patient localizes to pain, is moaning and uncooperative. Skin:  Skin is warm, dry and intact. No rash noted. Psychiatric: Patient cannot cooperate on arrival ____________________________________________  EKG: Interpreted by me.  Sinus rhythm with rate of 65 bpm, prolonged PR interval, septal infarct, normal axis, Mischke  QT  ____________________________________________  ED COURSE:  As part of my medical decision making, I reviewed the following data within the [place] History obtained from family if available, nursing notes, old chart and ekg, as well as notes from prior ED visits. Patient presented for altered mental status, we will assess with labs and imaging as indicated at this time.   Date/Time: 05/27/2019 10:33 PM Performed by: Earleen Newport, MD Preoxygenation: Pre-oxygenation with 100% oxygen Induction Type: Rapid sequence Laryngoscope Size: Mac and 4 Tube size: 7.5 mm Number of attempts: 1 Airway Equipment and Method: Video-laryngoscopy Placement Confirmation: ETT inserted through vocal cords under direct vision Secured at: 22 cm Tube secured with: ETT holder Dental Injury: Teeth and Oropharynx as per pre-operative assessment  Difficulty Due To: Difficulty was unanticipated    .Central Line  Date/Time: 05/14/2019 10:34 PM Performed by: Earleen Newport, MD Authorized by: Earleen Newport, MD   Consent:    Consent obtained:  Emergent situation Pre-procedure details:    Skin preparation:  2% chlorhexidine Procedure details:    Location:  R femoral   Patient position:  Flat   Procedural supplies:  Triple lumen   Landmarks identified: yes     Ultrasound guidance: yes     Number of attempts:  1 Post-procedure details:    Post-procedure:  Dressing applied   Assessment:  Blood return through all ports   Patient tolerance of procedure:  Tolerated well, no immediate complications    [name] was evaluated in Emergency Department on 06/11/2019 for the symptoms described in the history of present illness. He was evaluated in the context of the global COVID-19 pandemic, which necessitated consideration that the patient might be at risk for infection with the SARS-CoV-2 virus that causes COVID-19. Institutional protocols and algorithms that pertain to  the evaluation of patients at risk for COVID-19 are in a state of rapid change based on information released by regulatory bodies including the CDC and federal and state organizations. These policies and algorithms were followed during the patient's care in the ED.  ____________________________________________   LABS (pertinent positives/negatives)  Labs Reviewed  CBC WITH DIFFERENTIAL/PLATELET - Abnormal; Notable for the following components:      Result Value   WBC 12.4 (*)    RBC 2.53 (*)    Hemoglobin 8.0 (*)    HCT 24.5 (*)    RDW 15.8 (*)    Platelets 56 (*)    Neutro Abs 11.0 (*)    Lymphs Abs 0.6 (*)    Abs Immature Granulocytes 0.08 (*)    All other components within normal limits  COMPREHENSIVE METABOLIC PANEL - Abnormal; Notable for the following components:   Sodium 133 (*)    Chloride 96 (*)    CO2 10 (*)    Glucose, Bld <20 (*)    BUN 30 (*)    Creatinine, Ser 4.95 (*)    Calcium 7.9 (*)    Albumin 1.8 (*)  AST 214 (*)    ALT 74 (*)    Alkaline Phosphatase 140 (*)    Total Bilirubin 6.9 (*)    GFR calc non Af Amer 12 (*)    GFR calc Af Amer 14 (*)    Anion gap 27 (*)    All other components within normal limits  URINALYSIS, COMPLETE (UACMP) WITH MICROSCOPIC - Abnormal; Notable for the following components:   Color, Urine AMBER (*)    APPearance CLOUDY (*)    Glucose, UA 50 (*)    Hgb urine dipstick SMALL (*)    Protein, ur 100 (*)    Bacteria, UA FEW (*)    All other components within normal limits  PROTIME-INR - Abnormal; Notable for the following components:   Prothrombin Time 55.9 (*)    INR 6.5 (*)    All other components within normal limits  GLUCOSE, CAPILLARY - Abnormal; Notable for the following components:   Glucose-Capillary 142 (*)    All other components within normal limits  BLOOD GAS, VENOUS - Abnormal; Notable for the following components:   pCO2, Ven 21 (*)    pO2, Ven 165.0 (*)    Bicarbonate 9.6 (*)    Acid-base deficit 15.2 (*)     All other components within normal limits  LACTIC ACID, PLASMA - Abnormal; Notable for the following components:   Lactic Acid, Venous >11.0 (*)    All other components within normal limits  BLOOD GAS, ARTERIAL - Abnormal; Notable for the following components:   pH, Arterial 7.16 (*)    pCO2 arterial 27 (*)    pO2, Arterial 208 (*)    Bicarbonate 9.6 (*)    Acid-base deficit 17.6 (*)    All other components within normal limits  GLUCOSE, CAPILLARY - Abnormal; Notable for the following components:   Glucose-Capillary 428 (*)    All other components within normal limits  TROPONIN I (HIGH SENSITIVITY) - Abnormal; Notable for the following components:   Troponin I (High Sensitivity) 64 (*)    All other components within normal limits  CULTURE, BLOOD (ROUTINE X 2)  CULTURE, BLOOD (ROUTINE X 2)  SARS CORONAVIRUS 2 (HOSPITAL ORDER, [facility])  ETHANOL  URINE DRUG SCREEN, QUALITATIVE ([facility] ONLY)  AMMONIA  LACTIC ACID, PLASMA  CBG MONITORING, ED  PREPARE FRESH FROZEN PLASMA  PREPARE FRESH FROZEN PLASMA  TYPE AND SCREEN   CRITICAL CARE Performed by: Laurence Aly   Total critical care time: 60 minutes  Critical care time was exclusive of separately billable procedures and treating other patients.  Critical care was necessary to treat or prevent imminent or life-threatening deterioration.  Critical care was time spent personally by me on the following activities: development of treatment plan with patient and/or surrogate as well as nursing, discussions with consultants, evaluation of patient's response to treatment, examination of patient, obtaining history from patient or surrogate, ordering and performing treatments and interventions, ordering and review of laboratory studies, ordering and review of radiographic studies, pulse oximetry and re-evaluation of patient's condition.  RADIOLOGY Images were viewed by me  CT head, chest  x-ray  IMPRESSION:  No acute intracranial abnormality.  IMPRESSION:  1. Endotracheal tube tip about 4.7 cm superior to carina  2. Clear lung fields  CT chest/abdomen pelvis IMPRESSION:  1. Scattered airspace opacities most consistent with multifocal  pneumonia. Clinical correlation and follow-up recommended.  2. Advanced fatty infiltration of the liver with probable early  changes of cirrhosis.  3.  Inflammatory changes of the upper abdomen may be related to  underlying liver disease or acute pancreatitis. Correlation with  pancreatic enzymes recommended.  4. Hepatic colopathy versus pancolitis. Clinical correlation is  recommended. No bowel obstruction. Normal appendix.  5. Aortic Atherosclerosis (ICD10-I70.0).  ____________________________________________   DIFFERENTIAL DIAGNOSIS   Sepsis, CVA, MI, alcohol intoxication, overdose, hypothermia  FINAL ASSESSMENT AND PLAN  Altered mental status, acute renal failure, hypoglycemia, hypothermia, lactic acidosis   Plan: The patient had presented for altered mental status and was hypoglycemic as well as hypotensive.  Patient was emergently intubated and central venous line placed as dictated above.  Patient's labs revealed in numerous abnormalities including persistent hypoglycemia, renal failure, elevated troponin, lactic acidosis, acidemia, coagulopathy. Patient's imaging did not reveal any intracranial abnormality, chest x-ray is unremarkable.  Patient appears to be in liver failure and renal failure.  We aggressively gave glucose followed by fluids followed by FFP and have ordered a blood transfusion.  If he stabilizes further he will need liver transplant.  I will discuss with [facility] for possible transfer.   Laurence Aly, MD    Note: This note was generated in part or whole with voice recognition software. Voice recognition is usually quite accurate but there are transcription errors that can and very often do occur. I apologize  for any typographical errors that were not detected and corrected.     Earleen Newport, MD 05/28/2019 2340    Earleen Newport, MD 06/09/2019 2346    Earleen Newport, MD 06/11/19 [id number]

## 2019-06-11 ENCOUNTER — Encounter: Payer: Self-pay | Admitting: Adult Health

## 2019-06-11 DIAGNOSIS — F17210 Nicotine dependence, cigarettes, uncomplicated: Secondary | ICD-10-CM | POA: Diagnosis present

## 2019-06-11 DIAGNOSIS — K7010 Alcoholic hepatitis without ascites: Secondary | ICD-10-CM | POA: Diagnosis present

## 2019-06-11 DIAGNOSIS — E872 Acidosis: Secondary | ICD-10-CM | POA: Diagnosis present

## 2019-06-11 DIAGNOSIS — I5023 Acute on chronic systolic (congestive) heart failure: Secondary | ICD-10-CM | POA: Diagnosis present

## 2019-06-11 DIAGNOSIS — Z66 Do not resuscitate: Secondary | ICD-10-CM | POA: Diagnosis not present

## 2019-06-11 DIAGNOSIS — I4581 Long QT syndrome: Secondary | ICD-10-CM | POA: Diagnosis not present

## 2019-06-11 DIAGNOSIS — G9341 Metabolic encephalopathy: Secondary | ICD-10-CM | POA: Diagnosis present

## 2019-06-11 DIAGNOSIS — I130 Hypertensive heart and chronic kidney disease with heart failure and stage 1 through stage 4 chronic kidney disease, or unspecified chronic kidney disease: Secondary | ICD-10-CM | POA: Diagnosis present

## 2019-06-11 DIAGNOSIS — J9602 Acute respiratory failure with hypercapnia: Secondary | ICD-10-CM | POA: Diagnosis present

## 2019-06-11 DIAGNOSIS — Z20828 Contact with and (suspected) exposure to other viral communicable diseases: Secondary | ICD-10-CM | POA: Diagnosis present

## 2019-06-11 DIAGNOSIS — E43 Unspecified severe protein-calorie malnutrition: Secondary | ICD-10-CM | POA: Diagnosis present

## 2019-06-11 DIAGNOSIS — K859 Acute pancreatitis without necrosis or infection, unspecified: Secondary | ICD-10-CM | POA: Diagnosis present

## 2019-06-11 DIAGNOSIS — I420 Dilated cardiomyopathy: Secondary | ICD-10-CM | POA: Diagnosis not present

## 2019-06-11 DIAGNOSIS — E1122 Type 2 diabetes mellitus with diabetic chronic kidney disease: Secondary | ICD-10-CM | POA: Diagnosis present

## 2019-06-11 DIAGNOSIS — Z9861 Coronary angioplasty status: Secondary | ICD-10-CM | POA: Diagnosis not present

## 2019-06-11 DIAGNOSIS — J9601 Acute respiratory failure with hypoxia: Secondary | ICD-10-CM | POA: Diagnosis present

## 2019-06-11 DIAGNOSIS — R6521 Severe sepsis with septic shock: Secondary | ICD-10-CM | POA: Diagnosis present

## 2019-06-11 DIAGNOSIS — T405X1A Poisoning by cocaine, accidental (unintentional), initial encounter: Secondary | ICD-10-CM | POA: Diagnosis present

## 2019-06-11 DIAGNOSIS — R4182 Altered mental status, unspecified: Secondary | ICD-10-CM | POA: Diagnosis present

## 2019-06-11 DIAGNOSIS — E873 Alkalosis: Secondary | ICD-10-CM | POA: Diagnosis present

## 2019-06-11 DIAGNOSIS — A419 Sepsis, unspecified organism: Secondary | ICD-10-CM | POA: Diagnosis present

## 2019-06-11 DIAGNOSIS — J440 Chronic obstructive pulmonary disease with acute lower respiratory infection: Secondary | ICD-10-CM | POA: Diagnosis present

## 2019-06-11 DIAGNOSIS — K7041 Alcoholic hepatic failure with coma: Secondary | ICD-10-CM | POA: Diagnosis present

## 2019-06-11 DIAGNOSIS — J189 Pneumonia, unspecified organism: Secondary | ICD-10-CM | POA: Diagnosis present

## 2019-06-11 DIAGNOSIS — E11649 Type 2 diabetes mellitus with hypoglycemia without coma: Secondary | ICD-10-CM | POA: Diagnosis present

## 2019-06-11 DIAGNOSIS — J9600 Acute respiratory failure, unspecified whether with hypoxia or hypercapnia: Secondary | ICD-10-CM | POA: Diagnosis present

## 2019-06-11 DIAGNOSIS — N170 Acute kidney failure with tubular necrosis: Secondary | ICD-10-CM | POA: Diagnosis present

## 2019-06-11 LAB — BLOOD GAS, ARTERIAL
Acid-base deficit: 19.2 mmol/L — ABNORMAL HIGH (ref 0.0–2.0)
Acid-base deficit: 16.4 mmol/L — ABNORMAL HIGH (ref 0.0–2.0)
Bicarbonate: 10.1 mmol/L — ABNORMAL LOW (ref 20.0–28.0)
Bicarbonate: 11.6 mmol/L — ABNORMAL LOW (ref 20.0–28.0)
FIO2: 0.25
FIO2: 0.25
MECHVT: 500 mL
MECHVT: 500 mL
Mechanical Rate: 18
Mechanical Rate: 18
O2 Saturation: 96.1 %
O2 Saturation: 90.8 %
PEEP: 5 cmH2O
PEEP: 5 cmH2O
Patient temperature: 31.2
Patient temperature: 34.6
pCO2 arterial: 35 mmHg (ref 32.0–48.0)
pCO2 arterial: 34 mmHg (ref 32.0–48.0)
pH, Arterial: 7.14 — CL (ref 7.350–7.450)
pH, Arterial: 7.17 — CL (ref 7.350–7.450)
pO2, Arterial: 82 mmHg — ABNORMAL LOW (ref 83.0–108.0)
pO2, Arterial: 67 mmHg — ABNORMAL LOW (ref 83.0–108.0)

## 2019-06-11 LAB — COMPREHENSIVE METABOLIC PANEL
ALT: 86 U/L — ABNORMAL HIGH (ref 0–44)
AST: 289 U/L — ABNORMAL HIGH (ref 15–41)
Albumin: 1.8 g/dL — ABNORMAL LOW (ref 3.5–5.0)
Alkaline Phosphatase: 175 U/L — ABNORMAL HIGH (ref 38–126)
Anion gap: 22 — ABNORMAL HIGH (ref 5–15)
BUN: 28 mg/dL — ABNORMAL HIGH (ref 6–20)
CO2: 16 mmol/L — ABNORMAL LOW (ref 22–32)
Calcium: 7.7 mg/dL — ABNORMAL LOW (ref 8.9–10.3)
Chloride: 98 mmol/L (ref 98–111)
Creatinine, Ser: 4.79 mg/dL — ABNORMAL HIGH (ref 0.61–1.24)
GFR calc Af Amer: 14 mL/min — ABNORMAL LOW (ref >60)
GFR calc non Af Amer: 12 mL/min — ABNORMAL LOW (ref >60)
Glucose, Bld: 266 mg/dL — ABNORMAL HIGH (ref 70–99)
Potassium: 4.1 mmol/L (ref 3.5–5.1)
Sodium: 136 mmol/L (ref 135–145)
Total Bilirubin: 7.3 mg/dL — ABNORMAL HIGH (ref 0.3–1.2)
Total Protein: 6.8 g/dL (ref 6.5–8.1)

## 2019-06-11 LAB — URINE DRUG SCREEN, QUALITATIVE (ARMC ONLY)
Amphetamines, Ur Screen: NOT DETECTED
Barbiturates, Ur Screen: NOT DETECTED
Benzodiazepine, Ur Scrn: NOT DETECTED
Cannabinoid 50 Ng, Ur ~~LOC~~: NOT DETECTED
Cocaine Metabolite,Ur ~~LOC~~: NOT DETECTED
MDMA (Ecstasy)Ur Screen: NOT DETECTED
Methadone Scn, Ur: NOT DETECTED
Opiate, Ur Screen: NOT DETECTED
Phencyclidine (PCP) Ur S: NOT DETECTED
Tricyclic, Ur Screen: NOT DETECTED

## 2019-06-11 LAB — RENAL FUNCTION PANEL
Albumin: 1.8 g/dL — ABNORMAL LOW (ref 3.5–5.0)
Albumin: 1.7 g/dL — ABNORMAL LOW (ref 3.5–5.0)
Anion gap: 28 — ABNORMAL HIGH (ref 5–15)
Anion gap: 30 — ABNORMAL HIGH (ref 5–15)
BUN: 28 mg/dL — ABNORMAL HIGH (ref 6–20)
BUN: 29 mg/dL — ABNORMAL HIGH (ref 6–20)
CO2: 17 mmol/L — ABNORMAL LOW (ref 22–32)
CO2: 16 mmol/L — ABNORMAL LOW (ref 22–32)
Calcium: 6.9 mg/dL — ABNORMAL LOW (ref 8.9–10.3)
Calcium: 6.7 mg/dL — ABNORMAL LOW (ref 8.9–10.3)
Chloride: 94 mmol/L — ABNORMAL LOW (ref 98–111)
Chloride: 89 mmol/L — ABNORMAL LOW (ref 98–111)
Creatinine, Ser: 4.73 mg/dL — ABNORMAL HIGH (ref 0.61–1.24)
Creatinine, Ser: 4.86 mg/dL — ABNORMAL HIGH (ref 0.61–1.24)
GFR calc Af Amer: 14 mL/min — ABNORMAL LOW (ref >60)
GFR calc Af Amer: 14 mL/min — ABNORMAL LOW (ref >60)
GFR calc non Af Amer: 12 mL/min — ABNORMAL LOW (ref >60)
GFR calc non Af Amer: 12 mL/min — ABNORMAL LOW (ref >60)
Glucose, Bld: 86 mg/dL (ref 70–99)
Glucose, Bld: 91 mg/dL (ref 70–99)
Phosphorus: 5 mg/dL — ABNORMAL HIGH (ref 2.5–4.6)
Phosphorus: 6.2 mg/dL — ABNORMAL HIGH (ref 2.5–4.6)
Potassium: 3.2 mmol/L — ABNORMAL LOW (ref 3.5–5.1)
Potassium: 3.7 mmol/L (ref 3.5–5.1)
Sodium: 139 mmol/L (ref 135–145)
Sodium: 135 mmol/L (ref 135–145)

## 2019-06-11 LAB — CBC WITH DIFFERENTIAL/PLATELET
Abs Immature Granulocytes: 0.08 10*3/uL — ABNORMAL HIGH (ref 0.00–0.07)
Basophils Absolute: 0 10*3/uL (ref 0.0–0.1)
Basophils Relative: 0 %
Eosinophils Absolute: 0.1 10*3/uL (ref 0.0–0.5)
Eosinophils Relative: 1 %
HCT: 33.5 % — ABNORMAL LOW (ref 39.0–52.0)
Hemoglobin: 10.7 g/dL — ABNORMAL LOW (ref 13.0–17.0)
Immature Granulocytes: 1 %
Lymphocytes Relative: 4 %
Lymphs Abs: 0.5 10*3/uL — ABNORMAL LOW (ref 0.7–4.0)
MCH: 31.2 pg (ref 26.0–34.0)
MCHC: 31.9 g/dL (ref 30.0–36.0)
MCV: 97.7 fL (ref 80.0–100.0)
Monocytes Absolute: 0.5 10*3/uL (ref 0.1–1.0)
Monocytes Relative: 4 %
Neutro Abs: 11.8 10*3/uL — ABNORMAL HIGH (ref 1.7–7.7)
Neutrophils Relative %: 90 %
Platelets: 67 10*3/uL — ABNORMAL LOW (ref 150–400)
RBC: 3.43 MIL/uL — ABNORMAL LOW (ref 4.22–5.81)
RDW: 15.8 % — ABNORMAL HIGH (ref 11.5–15.5)
WBC: 13.1 10*3/uL — ABNORMAL HIGH (ref 4.0–10.5)
nRBC: 0.5 % — ABNORMAL HIGH (ref 0.0–0.2)

## 2019-06-11 LAB — URINALYSIS, COMPLETE (UACMP) WITH MICROSCOPIC
Bilirubin Urine: NEGATIVE
Glucose, UA: 50 mg/dL — AB
Ketones, ur: NEGATIVE mg/dL
Leukocytes,Ua: NEGATIVE
Nitrite: NEGATIVE
Protein, ur: 100 mg/dL — AB
Specific Gravity, Urine: 1.02 (ref 1.005–1.030)
pH: 5 (ref 5.0–8.0)

## 2019-06-11 LAB — PROCALCITONIN: Procalcitonin: 2.92 ng/mL

## 2019-06-11 LAB — BPAM RBC
Blood Product Expiration Date: 202009162359
Blood Product Expiration Date: 202009172359
ISSUE DATE / TIME: 202008282318
ISSUE DATE / TIME: 202008282318
Unit Type and Rh: 5100
Unit Type and Rh: 5100

## 2019-06-11 LAB — TYPE AND SCREEN
ABO/RH(D): O NEG
Antibody Screen: NEGATIVE
Unit division: 0
Unit division: 0

## 2019-06-11 LAB — PROTIME-INR
INR: 4.8 (ref 0.8–1.2)
INR: 3.4 — ABNORMAL HIGH (ref 0.8–1.2)
Prothrombin Time: 44.3 seconds — ABNORMAL HIGH (ref 11.4–15.2)
Prothrombin Time: 33.6 seconds — ABNORMAL HIGH (ref 11.4–15.2)

## 2019-06-11 LAB — MAGNESIUM
Magnesium: 1.6 mg/dL — ABNORMAL LOW (ref 1.7–2.4)
Magnesium: 1.4 mg/dL — ABNORMAL LOW (ref 1.7–2.4)

## 2019-06-11 LAB — SARS CORONAVIRUS 2 BY RT PCR (HOSPITAL ORDER, PERFORMED IN ~~LOC~~ HOSPITAL LAB): SARS Coronavirus 2: NEGATIVE

## 2019-06-11 LAB — FIBRINOGEN: Fibrinogen: 226 mg/dL (ref 210–475)

## 2019-06-11 LAB — GLUCOSE, CAPILLARY
Glucose-Capillary: 284 mg/dL — ABNORMAL HIGH (ref 70–99)
Glucose-Capillary: 286 mg/dL — ABNORMAL HIGH (ref 70–99)
Glucose-Capillary: 193 mg/dL — ABNORMAL HIGH (ref 70–99)
Glucose-Capillary: 121 mg/dL — ABNORMAL HIGH (ref 70–99)
Glucose-Capillary: 66 mg/dL — ABNORMAL LOW (ref 70–99)
Glucose-Capillary: 122 mg/dL — ABNORMAL HIGH (ref 70–99)
Glucose-Capillary: 87 mg/dL (ref 70–99)

## 2019-06-11 LAB — LACTIC ACID, PLASMA
Lactic Acid, Venous: >11 mmol/L (ref 0.5–1.9)
Lactic Acid, Venous: >11 mmol/L (ref 0.5–1.9)

## 2019-06-11 LAB — AMMONIA: Ammonia: 157 umol/L — ABNORMAL HIGH (ref 9–35)

## 2019-06-11 LAB — VANCOMYCIN, RANDOM: Vancomycin Rm: 18

## 2019-06-11 LAB — HEMOGLOBIN A1C
Hgb A1c MFr Bld: 5.1 % (ref 4.8–5.6)
Mean Plasma Glucose: 99.67 mg/dL

## 2019-06-11 LAB — MRSA PCR SCREENING: MRSA by PCR: NEGATIVE

## 2019-06-11 LAB — PHOSPHORUS: Phosphorus: 7.8 mg/dL — ABNORMAL HIGH (ref 2.5–4.6)

## 2019-06-11 LAB — PREPARE RBC (CROSSMATCH)

## 2019-06-11 MED ORDER — SODIUM CHLORIDE 0.9 % IV SOLN
0.0000 ug/min | INTRAVENOUS | Status: DC
  Administered 2019-06-11: 20 ug/min via INTRAVENOUS
  Administered 2019-06-12: 400 ug/min via INTRAVENOUS
  Administered 2019-06-12: 05:00:00 320 ug/min via INTRAVENOUS
  Administered 2019-06-12: 02:00:00 150 ug/min via INTRAVENOUS
  Administered 2019-06-12 (×5): 400 ug/min via INTRAVENOUS
  Filled 2019-06-11: qty 4
  Filled 2019-06-11: qty 40
  Filled 2019-06-11 (×2): qty 4
  Filled 2019-06-11 (×2): qty 40
  Filled 2019-06-11 (×2): qty 4
  Filled 2019-06-11 (×3): qty 40
  Filled 2019-06-11: qty 4

## 2019-06-11 MED ORDER — SODIUM CHLORIDE 0.9% FLUSH
3.0000 mL | Freq: Two times a day (BID) | INTRAVENOUS | Status: DC
  Administered 2019-06-11 (×3): 3 mL via INTRAVENOUS

## 2019-06-11 MED ORDER — VANCOMYCIN HCL IN DEXTROSE 1-5 GM/200ML-% IV SOLN
1000.0000 mg | Freq: Once | INTRAVENOUS | Status: AC
  Administered 2019-06-11: 1000 mg via INTRAVENOUS
  Filled 2019-06-11: qty 200

## 2019-06-11 MED ORDER — VITAMIN K1 10 MG/ML IJ SOLN
10.0000 mg | Freq: Once | INTRAVENOUS | Status: AC
  Administered 2019-06-11: 10 mg via INTRAVENOUS
  Filled 2019-06-11: qty 1

## 2019-06-11 MED ORDER — CHLORHEXIDINE GLUCONATE 0.12% ORAL RINSE (MEDLINE KIT)
15.0000 mL | Freq: Two times a day (BID) | OROMUCOSAL | Status: DC
  Administered 2019-06-11 – 2019-06-12 (×3): 15 mL via OROMUCOSAL

## 2019-06-11 MED ORDER — SODIUM BICARBONATE 8.4 % IV SOLN
150.0000 meq | Freq: Once | INTRAVENOUS | Status: AC
  Administered 2019-06-11: 09:00:00 150 meq via INTRAVENOUS
  Filled 2019-06-11: qty 50

## 2019-06-11 MED ORDER — NOREPINEPHRINE BITARTRATE 1 MG/ML IV SOLN
0.0000 ug/min | INTRAVENOUS | Status: DC
  Administered 2019-06-11: 15 ug/min via INTRAVENOUS
  Filled 2019-06-11 (×2): qty 4

## 2019-06-11 MED ORDER — FENTANYL 2500MCG IN NS 250ML (10MCG/ML) PREMIX INFUSION
0.0000 ug/h | INTRAVENOUS | Status: DC
  Administered 2019-06-11: 50 ug/h via INTRAVENOUS
  Administered 2019-06-12: 150 ug/h via INTRAVENOUS
  Filled 2019-06-11 (×2): qty 250

## 2019-06-11 MED ORDER — PANTOPRAZOLE SODIUM 40 MG IV SOLR
40.0000 mg | Freq: Every day | INTRAVENOUS | Status: DC
  Administered 2019-06-11 – 2019-06-12 (×2): 40 mg via INTRAVENOUS
  Filled 2019-06-11 (×2): qty 40

## 2019-06-11 MED ORDER — NOREPINEPHRINE 16 MG/250ML-% IV SOLN
0.0000 ug/min | INTRAVENOUS | Status: DC
  Administered 2019-06-11: 25 ug/min via INTRAVENOUS
  Administered 2019-06-12: 4 ug/min via INTRAVENOUS
  Filled 2019-06-11 (×2): qty 250

## 2019-06-11 MED ORDER — LORAZEPAM 2 MG/ML IJ SOLN: 1.0000 mg | Freq: Four times a day (QID) | INTRAMUSCULAR | Status: DC | PRN

## 2019-06-11 MED ORDER — STERILE WATER FOR INJECTION IV SOLN
INTRAVENOUS | Status: DC
  Administered 2019-06-11 (×3): via INTRAVENOUS
  Filled 2019-06-11 (×6): qty 850

## 2019-06-11 MED ORDER — SODIUM BICARBONATE 8.4 % IV SOLN
INTRAVENOUS | Status: AC
  Administered 2019-06-11: 150 meq via INTRAVENOUS
  Filled 2019-06-11: qty 50

## 2019-06-11 MED ORDER — SODIUM BICARBONATE 8.4 % IV SOLN
100.0000 meq | INTRAVENOUS | Status: AC
  Administered 2019-06-11: 04:00:00 100 meq via INTRAVENOUS

## 2019-06-11 MED ORDER — THIAMINE HCL 100 MG/ML IJ SOLN
Freq: Once | INTRAVENOUS | Status: AC
  Administered 2019-06-11: 09:00:00 via INTRAVENOUS
  Filled 2019-06-11: qty 1000

## 2019-06-11 MED ORDER — MAGNESIUM SULFATE 4 GM/100ML IV SOLN
4.0000 g | Freq: Once | INTRAVENOUS | Status: AC
  Administered 2019-06-11: 18:00:00 4 g via INTRAVENOUS
  Filled 2019-06-11: qty 100

## 2019-06-11 MED ORDER — SODIUM CHLORIDE 0.9 % IV SOLN
2.0000 g | INTRAVENOUS | Status: DC
  Administered 2019-06-11: 2 g via INTRAVENOUS
  Filled 2019-06-11: qty 2

## 2019-06-11 MED ORDER — SODIUM CHLORIDE 0.9 % IV SOLN
2.0000 g | Freq: Two times a day (BID) | INTRAVENOUS | Status: DC
  Administered 2019-06-12: 2 g via INTRAVENOUS
  Filled 2019-06-11 (×2): qty 20
  Filled 2019-06-11: qty 2

## 2019-06-11 MED ORDER — VASOPRESSIN 20 UNIT/ML IV SOLN
0.0300 [IU]/min | INTRAVENOUS | Status: DC
  Administered 2019-06-11 – 2019-06-12 (×2): 0.03 [IU]/min via INTRAVENOUS
  Filled 2019-06-11 (×3): qty 2

## 2019-06-11 MED ORDER — SODIUM BICARBONATE 8.4 % IV SOLN
INTRAVENOUS | Status: AC
  Administered 2019-06-11: 04:00:00 100 meq via INTRAVENOUS
  Filled 2019-06-11: qty 100

## 2019-06-11 MED ORDER — SODIUM CHLORIDE 0.9 % IV SOLN: 250.0000 mL | INTRAVENOUS | Status: DC | PRN

## 2019-06-11 MED ORDER — ADULT MULTIVITAMIN W/MINERALS CH: 1.0000 | ORAL_TABLET | Freq: Every day | ORAL | Status: DC

## 2019-06-11 MED ORDER — ORAL CARE MOUTH RINSE
15.0000 mL | OROMUCOSAL | Status: DC
  Administered 2019-06-11 – 2019-06-12 (×15): 15 mL via OROMUCOSAL

## 2019-06-11 MED ORDER — SODIUM CHLORIDE 0.9 % IV SOLN
100.0000 mg | Freq: Two times a day (BID) | INTRAVENOUS | Status: DC
  Administered 2019-06-11 – 2019-06-12 (×4): 100 mg via INTRAVENOUS
  Filled 2019-06-11 (×5): qty 100

## 2019-06-11 MED ORDER — IPRATROPIUM-ALBUTEROL 0.5-2.5 (3) MG/3ML IN SOLN
3.0000 mL | Freq: Four times a day (QID) | RESPIRATORY_TRACT | Status: DC
  Administered 2019-06-11 – 2019-06-12 (×6): 3 mL via RESPIRATORY_TRACT
  Filled 2019-06-11 (×4): qty 3

## 2019-06-11 MED ORDER — SODIUM CHLORIDE 0.9% FLUSH: 3.0000 mL | INTRAVENOUS | Status: DC | PRN

## 2019-06-11 MED ORDER — PHENYLEPHRINE HCL-NACL 10-0.9 MG/250ML-% IV SOLN
0.0000 ug/min | INTRAVENOUS | Status: DC
  Filled 2019-06-11: qty 250

## 2019-06-11 MED ORDER — SODIUM CHLORIDE 0.9 % IV SOLN: 500.0000 mg | INTRAVENOUS | Status: DC

## 2019-06-11 MED ORDER — DEXTROSE 50 % IV SOLN
25.0000 mL | Freq: Once | INTRAVENOUS | Status: AC
  Administered 2019-06-11: 25 mL via INTRAVENOUS
  Filled 2019-06-11: qty 50

## 2019-06-11 MED ORDER — INSULIN ASPART 100 UNIT/ML ~~LOC~~ SOLN
0.0000 [IU] | SUBCUTANEOUS | Status: DC
  Administered 2019-06-11: 09:00:00 2 [IU] via SUBCUTANEOUS
  Administered 2019-06-11: 1 [IU] via SUBCUTANEOUS
  Filled 2019-06-11 (×2): qty 1

## 2019-06-11 MED ORDER — MAGNESIUM SULFATE IN D5W 1-5 GM/100ML-% IV SOLN
1.0000 g | Freq: Once | INTRAVENOUS | Status: AC
  Administered 2019-06-11: 1 g via INTRAVENOUS
  Filled 2019-06-11: qty 100

## 2019-06-11 MED ORDER — LORAZEPAM 1 MG PO TABS: 1.0000 mg | ORAL_TABLET | Freq: Four times a day (QID) | ORAL | Status: DC | PRN

## 2019-06-11 MED ORDER — VANCOMYCIN VARIABLE DOSE PER UNSTABLE RENAL FUNCTION (PHARMACIST DOSING): Status: DC

## 2019-06-11 MED ORDER — PUREFLOW DIALYSIS SOLUTION
INTRAVENOUS | Status: DC
  Administered 2019-06-12 (×3): via INTRAVENOUS_CENTRAL

## 2019-06-11 MED ORDER — POTASSIUM CHLORIDE 10 MEQ/50ML IV SOLN
10.0000 meq | INTRAVENOUS | Status: AC
  Administered 2019-06-11 (×4): 10 meq via INTRAVENOUS
  Filled 2019-06-11 (×5): qty 50

## 2019-06-11 MED ORDER — THIAMINE HCL 100 MG/ML IJ SOLN
100.0000 mg | Freq: Every day | INTRAMUSCULAR | Status: DC
  Administered 2019-06-11 – 2019-06-12 (×2): 100 mg via INTRAVENOUS
  Filled 2019-06-11 (×2): qty 2

## 2019-06-11 MED ORDER — STERILE WATER FOR INJECTION IV SOLN: INTRAVENOUS | Status: DC

## 2019-06-11 MED ORDER — SODIUM CHLORIDE 0.9 % IV SOLN
0.0000 ug/min | INTRAVENOUS | Status: DC
  Administered 2019-06-11: 18:00:00 100 ug/min via INTRAVENOUS
  Filled 2019-06-11: qty 10

## 2019-06-11 MED ORDER — VITAMIN B-1 100 MG PO TABS: 100.0000 mg | ORAL_TABLET | Freq: Every day | ORAL | Status: DC

## 2019-06-11 MED ORDER — HYDROCORTISONE NA SUCCINATE PF 100 MG IJ SOLR
100.0000 mg | Freq: Two times a day (BID) | INTRAMUSCULAR | Status: AC
  Administered 2019-06-11 – 2019-06-12 (×2): 100 mg via INTRAVENOUS
  Filled 2019-06-11 (×2): qty 2

## 2019-06-11 MED ORDER — HEPARIN SODIUM (PORCINE) 1000 UNIT/ML DIALYSIS
1000.0000 [IU] | INTRAMUSCULAR | Status: DC | PRN
  Administered 2019-06-12: 1000 [IU] via INTRAVENOUS_CENTRAL
  Filled 2019-06-11 (×3): qty 6

## 2019-06-11 MED ORDER — CHLORHEXIDINE GLUCONATE CLOTH 2 % EX PADS
6.0000 | MEDICATED_PAD | Freq: Every day | CUTANEOUS | Status: DC
  Administered 2019-06-11: 16:00:00 6 via TOPICAL

## 2019-06-11 MED ORDER — PIPERACILLIN-TAZOBACTAM 3.375 G IVPB 30 MIN
3.3750 g | Freq: Once | INTRAVENOUS | Status: AC
  Administered 2019-06-11: 3.375 g via INTRAVENOUS
  Filled 2019-06-11: qty 50

## 2019-06-11 MED ORDER — STERILE WATER FOR INJECTION IJ SOLN
INTRAMUSCULAR | Status: AC
  Administered 2019-06-11: 11:00:00 10 mL
  Filled 2019-06-11: qty 10

## 2019-06-11 MED ORDER — FOLIC ACID 1 MG PO TABS: 1.0000 mg | ORAL_TABLET | Freq: Every day | ORAL | Status: DC

## 2019-06-11 NOTE — Progress Notes (Signed)
Patient admitted this morning.  Agree with current plan.  Case discussed briefly with intensivist.  Continue vent management as per intensivist.  Patient with shock due to combination of sepsis, liver failure and volume depletion.  Patient with multiorgan failure.  Patient high risk for cardiopulmonary event.  Patient also noted to have ammonia level of 157.

## 2019-06-11 NOTE — ED Notes (Signed)
CRITICAL LAB: GLUCOSE < 20 and INR is 6.50, [facility], Dr. Jimmye Norman notified, orders received

## 2019-06-11 NOTE — Progress Notes (Addendum)
Received call back from Dr Sidney Ace. Critical abg results given. Patient's temp per RN at time abg drawn 31.2C.  Temp correct results 7.14 ph pco2 27 po2 82 BE -19.2 HCO3 10.1..... Measured results for temp at 37 7.07 ph pco2 35 po2 113 HCO3 10.1  Vent settings vt 500 rr 18 25% peep 5

## 2019-06-11 NOTE — Progress Notes (Signed)
Pharmacy Electrolyte Monitoring Consult:  Pharmacy consulted to assist in monitoring and replacing electrolytes in this 58 y.o. male admitted on 06/08/2019 with Hypoglycemia and Loss of Consciousness PNA   Labs:  Sodium (mmol/L)  Date Value  06/11/2019 136  08/09/2018 141   Potassium (mmol/L)  Date Value  06/11/2019 4.1   Magnesium (mg/dL)  Date Value  06/11/2019 1.6 (L)   Phosphorus (mg/dL)  Date Value  06/11/2019 7.8 (H)   Calcium (mg/dL)  Date Value  06/11/2019 7.7 (L)   Albumin (g/dL)  Date Value  06/11/2019 1.8 (L)  08/09/2018 4.6   Hx ETOH  Assessment/Plan: K 4.1 Mag 1.6  Phos 7.8 Scr 4.95 >4.79 Ca 7.8 Albumin 1.8  Will order Magnesium 1 gram IV x 1 F/u electrolytes in am  Marcus Foster A 06/11/2019 10:48 AM

## 2019-06-11 NOTE — Progress Notes (Signed)
Pharmacy Antibiotic Note  Marcus Foster is a 58 y.o. male admitted on July 02, 2019 with pneumonia.  Pharmacy has been consulted for vancomycin dosing. Patient was found unresponsive and brought to ED found to have a LA >11.0, hypothermic Tmin 84.1, 4/4 SIRS, CT chest found to have a pneumonia but admission team suspects aspiration.  Plan: Patient received vanc 1g and zosyn 3.375g IV x 1 in ED. Patient has AKI on CKD w/ BL Scr around 1.2 - 1.3 and patient is currently 4.95. Will dose per random levels w/ a goal random < 20 mcg/mL. Will check a random level w/ am labs. Will continue to monitor renal function and adjust as necessary.  8/29 MRSA PCR negative. Messaged with MD Mody- will d/c Vancomycin. Patient on Ceftriaxone/Doxycyline    Height: 6' (182.9 cm) Weight: 170 lb 10.2 oz (77.4 kg) IBW/kg (Calculated) : 77.6  Temp (24hrs), Avg:90.5 F (32.5 C), Min:84.1 F (28.9 C), Max:96.8 F (36 C)  Recent Labs  Lab 2019-07-02 2138 07/02/19 2331 06/11/19 0513 06/11/19 0644  WBC 12.4*  --  13.1*  --   CREATININE 4.95*  --  4.79*  --   LATICACIDVEN >11.0* >11.0*  --  >11.0*  VANCORANDOM  --   --   --  18    Estimated Creatinine Clearance: 18 mL/min (A) (by C-G formula based on SCr of 4.79 mg/dL (H)).    No Known Allergies  Thank you for allowing pharmacy to be a part of this patient's care.  Chinita Greenland PharmD Clinical Pharmacist 06/11/2019

## 2019-06-11 NOTE — Consult Note (Addendum)
PULMONARY / CRITICAL CARE MEDICINE  Name: Marcus Foster MRN: [id number] DOB: Jan 13, 1961    LOS: 0  Referring Provider: Gardiner Barefoot, NP Reason for Referral: Multisystem failure Brief patient description: This is a 58 year old male with an extensive history of polysubstance abuse, alcohol abuse, congestive heart failure, and dilated cardiomyopathy who was found down unresponsive and brought to the emergency room, intubated and now in multiorgan failure with extremely low chances for survival.  HPI: This is a 58 year old African-American male with a history of dilated cardiomyopathy, alcohol abuse, polysubstance abuse, systolic heart failure, hypertension, COPD/asthma, and recently new onset dysphagia, and esophageal candidiasis who was brought to the ED for evaluation after being found unresponsive by family. He is currently intubated and sedated and unable to provide any history hence history is obtained from ED and EMS records. When EMS arrived patient was hypoglycemic.  Upon arrival in the ED, he was emergently intubated.  His ED work-up was significant for multifocal pneumonia , liver cirrhosis, acute pancreatitis and possible pancolitis on CT.  His ABG postintubation showed a pH of 7.1, PCO2 of 27, PO2 of 208, bicarb of 9.6 and SPO2 99% on 45% FiO2.  His chemistry showed a blood glucose level of less than 20, creatinine of 4.95, elevated LFTs and lactic acid level of greater than 11.  His WBC was 12.4, hemoglobin 8.0, hematocrit 24.5 and platelets of 56.  His INR was six-point 5 repeat today of 55.9.  Right femoral triple-lumen catheter was placed and he was transfused 2 units of fresh frozen plasma.  Attempts were made to transfer patient to a higher level of care where he can receive specialized hepatologists care for his alcoholic hepatitis and multiorgan failure.  Unfortunately all higher level hospitals declined given patient's poor prognosis.  He is being admitted to the ICU for further  management.  He is bleeding from his mouth, rectum and IV sites  Past Medical History:  Diagnosis Date  . Acid reflux   . Asthma   . Chronic systolic CHF (congestive heart failure) ([place])    a. TTE 08/2017: EF 20-25%, moderate LVH, unable to exclude RWMA, mild to moderate MR, moderately dilated LV, mildly dilated RV with moderately reduced RVSF, mildly dilated RA, trivial pericardial effusion  . Hypertension   . Noncompliance   . Obesity   . Polysubstance abuse ([name])    a. ongoing cocaine, tobacco, and alcohol abuse   Past Surgical History:  Procedure Laterality Date  . COLONOSCOPY    . CORONARY ANGIOPLASTY    . ESOPHAGOGASTRODUODENOSCOPY    . RIGHT/LEFT HEART CATH AND CORONARY ANGIOGRAPHY N/A 11/19/2017   Procedure: RIGHT/LEFT HEART CATH AND CORONARY ANGIOGRAPHY;  Surgeon: Wellington Hampshire, MD;  Location: [facility];  Service: Cardiovascular;  Laterality: N/A;   No current facility-administered medications on file prior to encounter.    Current Outpatient Medications on File Prior to Encounter  Medication Sig  . aspirin 81 MG EC tablet Take 1 tablet (81 mg total) by mouth daily.  Marland Kitchen atorvastatin (LIPITOR) 40 MG tablet Take 1 tablet (40 mg total) by mouth daily.  . carvedilol (COREG) 6.25 MG tablet Take 1 tablet (6.25 mg total) by mouth 2 (two) times daily.  . Fluticasone-Umeclidin-Vilant (TRELEGY ELLIPTA) 100-62.5-25 MCG/INH AEPB Inhale 1 puff into the lungs daily.  . folic acid (FOLVITE) 1 MG tablet Take 1 tablet (1 mg total) by mouth daily.  . furosemide (LASIX) 40 MG tablet Take 1 tablet (40 mg total) by mouth daily.  Marland Kitchen  ibuprofen (ADVIL,MOTRIN) 200 MG tablet Take 200 mg by mouth every 6 (six) hours as needed.  . magnesium oxide (MAG-OX) 400 (241.3 Mg) MG tablet Take 1 tablet (400 mg total) by mouth daily.  . potassium chloride SA (K-DUR) 20 MEQ tablet Take 1 tablet (20 mEq total) by mouth daily.  . sacubitril-valsartan (ENTRESTO) 49-51 MG Take 1 tablet by mouth 2 (two)  times daily.  Marland Kitchen. thiamine (VITAMIN B-1) 100 MG tablet Take 100 mg by mouth daily.    Allergies No Known Allergies  Family History Family History  Problem Relation Age of Onset  . Hypertension Mother    Social History  reports that he has been smoking. He has a 7.50 pack-year smoking history. He has never used smokeless tobacco. He reports current alcohol use. He reports current drug use. Drug: Cocaine.  Review Of Systems: Unable to obtain as patient is intubated and unresponsive  VITAL SIGNS: BP 91/62   Pulse 85   Temp (!) 92 F (33.3 C)   Resp 19   Ht 6' (1.829 m)   Wt 85 kg   SpO2 97%   BMI 25.41 kg/m   HEMODYNAMICS:    VENTILATOR SETTINGS: Vent Mode: PRVC FiO2 (%):  [25 %-45 %] 25 % Set Rate:  [18 bmp] 18 bmp Vt Set:  [500 mL] 500 mL PEEP:  [5 cmH20] 5 cmH20  INTAKE / OUTPUT: I/O last 3 completed shifts: In: 1000 [IV Piggyback:1000] Out: -   PHYSICAL EXAMINATION: General: Intubated and sedated, acutely ill looking HEENT: Normocephalic and atraumatic, PERRLA, trachea midline, ET tube in place Neuro: Unresponsive to voice touch or noxious stimulus, no gag and corneal reflexes, pupils dilated Cardiovascular: Apical pulse tachycardic, regular, S1-S2, no murmur reculture gallop, +2 pulses bilateral, trace edema in bilateral lower extremities Lungs: Bilateral breath sounds, diminished in the bases, no wheezing, bibasilar rales Abdomen: Nondistended, normal bowel sounds in all 4 quadrants, palpation reveals no organomegaly Musculoskeletal: Positive range of motion in upper and lower extremities, no joint deformities Skin: Cool and clammy  LABS:  BMET Recent Labs  Lab 06/11/2019 2138  NA 133*  K 4.7  CL 96*  CO2 10*  BUN 30*  CREATININE 4.95*  GLUCOSE <20*    Electrolytes Recent Labs  Lab 06/02/2019 2138  CALCIUM 7.9*    CBC Recent Labs  Lab 05/28/2019 2138  WBC 12.4*  HGB 8.0*  HCT 24.5*  PLT 56*    Coag's Recent Labs  Lab 05/15/2019 2138   INR 6.5*    Sepsis Markers Recent Labs  Lab 06/05/2019 2138 05/18/2019 2331  LATICACIDVEN >11.0* >11.0*    ABG Recent Labs  Lab 06/03/2019 2230 06/11/19 0114  PHART 7.16* 7.14*  PCO2ART 27* 35  PO2ART 208* 82*    Liver Enzymes Recent Labs  Lab 06/11/2019 2138  AST 214*  ALT 74*  ALKPHOS 140*  BILITOT 6.9*  ALBUMIN 1.8*    Cardiac Enzymes No results for input(s): TROPONINI, PROBNP in the last 168 hours.  Glucose Recent Labs  Lab 05/22/2019 2143 05/20/2019 2240 06/11/19 0218 06/11/19 0429  GLUCAP 142* 428* 284* 286*    Imaging Ct Abdomen Pelvis Wo Contrast  Result Date: 05/27/2019 CLINICAL DATA:  58 year old male with unresponsiveness. EXAM: CT CHEST, ABDOMEN AND PELVIS WITHOUT CONTRAST TECHNIQUE: Multidetector CT imaging of the chest, abdomen and pelvis was performed following the standard protocol without IV contrast. COMPARISON:  Abdominal ultrasound dated 03/31/2010 and CT dated 03/31/2010 FINDINGS: Evaluation of this exam is limited in the absence of intravenous  contrast. CT CHEST FINDINGS Cardiovascular: There is no cardiomegaly or pericardial effusion. Advanced multi vessel coronary vascular calcification noted. There is mild atherosclerotic calcification of the aorta. No aneurysmal dilatation. The central pulmonary arteries are grossly unremarkable. Mediastinum/Nodes: There is no hilar or mediastinal adenopathy. The esophagus is grossly unremarkable. No mediastinal fluid collection. Lungs/Pleura: There are bibasilar subsegmental atelectasis. Scattered upper lobe predominant hazy airspace densities with peripheral and subpleural distribution most consistent with multifocal pneumonia, possibly atypical etiology. Clinical correlation is recommended. A 2.6 x 1.8 cm focal consolidative area in the right upper lobe likely infectious. Follow-up to resolution recommended to exclude neoplasm. No significant pleural effusion. There is no pneumothorax. The central airways are patent.  An endotracheal tube is noted with tip approximately 3.3 cm above the carina. Musculoskeletal: There is degenerative changes of the spine. Degenerative changes of the spine. No acute osseous pathology. CT ABDOMEN PELVIS FINDINGS There is no intra-abdominal free air. Trace free fluid within pelvis. Hepatobiliary: Advanced fatty infiltration of the liver. Slight irregularity of the liver contour may represent early changes of cirrhosis. No intrahepatic biliary ductal dilatation. High attenuating content within the gallbladder most consistent with sludge or hyper concentrated bile. No pericholecystic fluid. Pancreas: There is peripancreatic stranding and inflammatory changes. These findings may be related to underlying liver disease or represent acute pancreatitis. Correlation with pancreatic enzymes recommended. No drainable fluid collection/abscess or pseudocyst. Spleen: Normal in size without focal abnormality. Adrenals/Urinary Tract: The adrenal glands are unremarkable. There is no hydronephrosis or nephrolithiasis on either side. The visualized ureters appear unremarkable. The urinary bladder is decompressed around a Foley catheter. Stomach/Bowel: There is diffuse thickening of the colon. Although this may be related to underlying liver disease findings concerning for pancolitis. Clinical correlation is recommended. There is no bowel obstruction. The appendix is normal. Vascular/Lymphatic: Moderate aortoiliac atherosclerotic disease. A right femoral venous line with tip in the right external iliac vein is noted. No portal venous gas. There is no adenopathy. Reproductive: The prostate and seminal vesicles are grossly unremarkable. No pelvic mass. Other: Mild diffuse subcutaneous edema. Musculoskeletal: Osteopenia. Moderate bilateral hip osteoarthritic changes. No acute osseous pathology. IMPRESSION: 1. Scattered airspace opacities most consistent with multifocal pneumonia. Clinical correlation and follow-up  recommended. 2. Advanced fatty infiltration of the liver with probable early changes of cirrhosis. 3. Inflammatory changes of the upper abdomen may be related to underlying liver disease or acute pancreatitis. Correlation with pancreatic enzymes recommended. 4. Hepatic colopathy versus pancolitis. Clinical correlation is recommended. No bowel obstruction. Normal appendix. 5. Aortic Atherosclerosis (ICD10-I70.0). Electronically Signed   By: Elgie CollardArash  Radparvar M.D.   On: 2018/10/31 23:42   Dg Chest 1 View  Result Date: 2019/05/29 CLINICAL DATA:  Post intubation EXAM: CHEST  1 VIEW COMPARISON:  11/14/2017 FINDINGS: Endotracheal tube tip is about 4.7 cm superior to the carina. No consolidation or effusion. No pneumothorax. Normal cardiomediastinal silhouette. IMPRESSION: 1. Endotracheal tube tip about 4.7 cm superior to carina 2. Clear lung fields Electronically Signed   By: Jasmine PangKim  Fujinaga M.D.   On: 2018/10/31 23:04   Ct Head Wo Contrast  Result Date: 2019/05/29 CLINICAL DATA:  Altered level of consciousness EXAM: CT HEAD WITHOUT CONTRAST TECHNIQUE: Contiguous axial images were obtained from the base of the skull through the vertex without intravenous contrast. COMPARISON:  None. FINDINGS: Brain: No acute intracranial abnormality. Specifically, no hemorrhage, hydrocephalus, mass lesion, acute infarction, or significant intracranial injury. Vascular: No hyperdense vessel or unexpected calcification. Skull: No acute calvarial abnormality. Sinuses/Orbits: No acute finding Other: None  IMPRESSION: No acute intracranial abnormality. Electronically Signed   By: Charlett NoseKevin  Dover M.D.   On: 2019-09-14 23:26   Ct Chest Wo Contrast  Result Date: 01/25/19 CLINICAL DATA:  58 year old male with unresponsiveness. EXAM: CT CHEST, ABDOMEN AND PELVIS WITHOUT CONTRAST TECHNIQUE: Multidetector CT imaging of the chest, abdomen and pelvis was performed following the standard protocol without IV contrast. COMPARISON:  Abdominal  ultrasound dated 03/31/2010 and CT dated 03/31/2010 FINDINGS: Evaluation of this exam is limited in the absence of intravenous contrast. CT CHEST FINDINGS Cardiovascular: There is no cardiomegaly or pericardial effusion. Advanced multi vessel coronary vascular calcification noted. There is mild atherosclerotic calcification of the aorta. No aneurysmal dilatation. The central pulmonary arteries are grossly unremarkable. Mediastinum/Nodes: There is no hilar or mediastinal adenopathy. The esophagus is grossly unremarkable. No mediastinal fluid collection. Lungs/Pleura: There are bibasilar subsegmental atelectasis. Scattered upper lobe predominant hazy airspace densities with peripheral and subpleural distribution most consistent with multifocal pneumonia, possibly atypical etiology. Clinical correlation is recommended. A 2.6 x 1.8 cm focal consolidative area in the right upper lobe likely infectious. Follow-up to resolution recommended to exclude neoplasm. No significant pleural effusion. There is no pneumothorax. The central airways are patent. An endotracheal tube is noted with tip approximately 3.3 cm above the carina. Musculoskeletal: There is degenerative changes of the spine. Degenerative changes of the spine. No acute osseous pathology. CT ABDOMEN PELVIS FINDINGS There is no intra-abdominal free air. Trace free fluid within pelvis. Hepatobiliary: Advanced fatty infiltration of the liver. Slight irregularity of the liver contour may represent early changes of cirrhosis. No intrahepatic biliary ductal dilatation. High attenuating content within the gallbladder most consistent with sludge or hyper concentrated bile. No pericholecystic fluid. Pancreas: There is peripancreatic stranding and inflammatory changes. These findings may be related to underlying liver disease or represent acute pancreatitis. Correlation with pancreatic enzymes recommended. No drainable fluid collection/abscess or pseudocyst. Spleen: Normal  in size without focal abnormality. Adrenals/Urinary Tract: The adrenal glands are unremarkable. There is no hydronephrosis or nephrolithiasis on either side. The visualized ureters appear unremarkable. The urinary bladder is decompressed around a Foley catheter. Stomach/Bowel: There is diffuse thickening of the colon. Although this may be related to underlying liver disease findings concerning for pancolitis. Clinical correlation is recommended. There is no bowel obstruction. The appendix is normal. Vascular/Lymphatic: Moderate aortoiliac atherosclerotic disease. A right femoral venous line with tip in the right external iliac vein is noted. No portal venous gas. There is no adenopathy. Reproductive: The prostate and seminal vesicles are grossly unremarkable. No pelvic mass. Other: Mild diffuse subcutaneous edema. Musculoskeletal: Osteopenia. Moderate bilateral hip osteoarthritic changes. No acute osseous pathology. IMPRESSION: 1. Scattered airspace opacities most consistent with multifocal pneumonia. Clinical correlation and follow-up recommended. 2. Advanced fatty infiltration of the liver with probable early changes of cirrhosis. 3. Inflammatory changes of the upper abdomen may be related to underlying liver disease or acute pancreatitis. Correlation with pancreatic enzymes recommended. 4. Hepatic colopathy versus pancolitis. Clinical correlation is recommended. No bowel obstruction. Normal appendix. 5. Aortic Atherosclerosis (ICD10-I70.0). Electronically Signed   By: Elgie CollardArash  Radparvar M.D.   On: 2019-09-14 23:42    STUDIES:  None  CULTURES: Blood cultures x2 Urine culture Sputum culture  ANTIBIOTICS: Vancomycin Ceftriaxone  Doxycycline  SIGNIFICANT EVENTS: 01/25/19: Admitted  LINES/TUBES: Right femoral triple-lumen catheter ET tube Peripheral IVs Foley catheter  DISCUSSION: 58 year old male with an extensive medical history, history of alcohol abuse as well as polysubstance abuse, who  was found down unresponsive, now presenting with multiorgan failure  with limited chances of survival.  ASSESSMENT / PLAN:  PULMONARY A: Acute hypoxic and hypercarbic respiratory failure Multifocal pneumonia History of asthma Current everyday smoker Severe metabolic and lactic acidosis P:   Full vent support with current settings Serial ABGs and chest x-ray Antibiotics as above Nebulized bronchodilators VAP protocol Bicarb infusion  CARDIOVASCULAR A:  Shock- due to a combination of sepsis, liver failure, volume depletion and anemia Hypertension Nonischemic cardiomyopathy P:  Hemodynamic monitoring per ICU protocol IV fluid boluses and pressors to maintain mean arterial blood pressure greater than 65 Cycle cardiac enzymes Serial EKGs as needed 2D echo  RENAL A:   Acute renal failure- baseline creatinine 1.18 on 08/09/2018, currently 4.95 P:   IV hydration Strict I's and O's Avoid nephrotoxic medications and renally dose current medications Monitor and correct electrolytes Consider CRRT if any chances of survival  GASTROINTESTINAL A:   Acute liver failure Alcoholic liver cirrhosis Acute pancreatitis  Possible pancolitis DIC Alcohol abuse Polysubstance abuse  hyperammonemia-ammonia level 157 P:   Transfuse platelets and fresh frozen plasma as needed Antibiotics as above CIWA protocol once a awake Lactulose enema once bleeding subsides also rifaximin once a Dobbhoff tube or an NG is placed  HEMATOLOGIC A:   Severe coagulopathy secondary to liver failure-INR 6.5 and PTT 55.9 P:  Transfuse fresh frozen plasma until bleeding subsides Trend hemoglobin and hematocrit and transfuse for hemoglobin less than 7  INFECTIOUS A:   Multifocal pneumonia Pancreatitis and colitis P:   Antibiotics as above Follow-up cultures Trend procalcitonin Monitor fever curve  ENDOCRINE A:   Severe hyperglycemia without a diagnosis of type 2 diabetes P:   Now hyperglycemic  after dextrose infusion Monitor blood glucose levels every 4 hours with sensitive sliding scale insulin coverage Hemoglobin A1c with a.m. labs  NEUROLOGIC A:   Acute loss of consciousness secondary to hypoglycemia and severe metabolic acidosis P:   RASS goal: 0 Not currently on any sedatives Continue to monitor neurological status as hypoglycemia and metabolic acidosis are corrected  Best Practice: Code Status: Full code Diet: N.p.o. GI prophylaxis: PPI VTE prophylaxis: SCDs and no pharmacologic DVT prophylaxis in light of bleeding and DIC  FAMILY  - Updates: Patient's family updated by ED attending and admitting attending.  Will update with any changes in treatment plan.  COVID-19 DISASTER DECLARATION:   FULL CONTACT PHYSICAL EXAMINATION WAS NOT POSSIBLE DUE TO TREATMENT OF COVID-19  AND CONSERVATION OF PERSONAL PROTECTIVE EQUIPMENT, LIMITED EXAM FINDINGS INCLUDE-  Patient assessed or the symptoms described in the history of present illness.  In the context of the Global COVID-19 pandemic, which necessitated consideration that the patient might be at risk for infection with the SARS-CoV-2 virus that causes COVID-19, Institutional protocols and algorithms that pertain to the evaluation of patients at risk for COVID-19 are in a state of rapid change based on information released by regulatory bodies including the CDC and federal and state organizations. These policies and algorithms were followed during the patient's care while in hospital.  Ronnie Mallette S. Buren Kos ANP-BC Pulmonary and Critical Care Medicine [facility] Pager [phone] or [phone]  NB: This document was prepared using Dragon voice recognition software and may include unintentional dictation errors.    06/11/2019, 4:54 AM

## 2019-06-11 NOTE — ED Notes (Signed)
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

## 2019-06-11 NOTE — H&P (Signed)
[facility] -  at [facility]   PATIENT NAME: Marcus Foster    MR#:  [id number]  DATE OF BIRTH:  1960-12-14  DATE OF ADMISSION:  07-Jul-2019  PRIMARY CARE PHYSICIAN: Armando Gang, FNP   REQUESTING/REFERRING PHYSICIAN: Daryel November, MD  CHIEF COMPLAINT:   Chief Complaint  Patient presents with   Hypoglycemia   Loss of Consciousness    HISTORY OF PRESENT ILLNESS:  Marcus Foster  is a 58 y.o. male with a known history of chronic systolic CHF, alcohol and polysubstance abuse, nonischemic cardiomyopathy, hypertension, history of lymphedema, and medical noncompliance.  He has a history of echocardiogram November 2018 with ejection fraction 20 to 25%.  Patient presented to the emergency room via EMS services after being found down in his home by his daughter unresponsive.  Patient's daughter reports he had been more short of breath over the last 24 hours with nonproductive cough.  She is unaware of fevers, chills, nausea, vomiting, diarrhea.  No known complaints of chest pain or abdominal pain.  Patient's daughter reports he usually drinks beer and/or liquor on a daily basis.    Ethanol level on arrival to the emergency room is less than 10.  Urine drug screen is negative as well.  He has a history of EtOH hepatitis however according to [facility] documentation he is not currently a candidate for liver transplantation.  Patient also has a history of CKD.  Creatinine on arrival is 4.95 with a BUN of 30.  His hemoglobin is 8 with hematocrit 24.5 and platelet count of 56,000 on arrival.  WBC is 12.4.  CT head is negative on arrival with CT chest demonstrating multifocal pneumonia with hepatic colopathy versus pancolitis.    Shortly after his arrival, patient VBG was completed with pH of 7.16.  He was intubated and sedated.  Patient was hypotensive as well and therefore placed on vasopressor therapy.  INR is 6.  He received 2 units packed red blood cells and 2 units fresh frozen  plasma in the emergency room.  He was admitted by the hospitalist service with transfer of care to the ICU intensivist.  PAST MEDICAL HISTORY:   Past Medical History:  Diagnosis Date   Acid reflux    Asthma    Chronic systolic CHF (congestive heart failure) (HCC)    a. TTE 08/2017: EF 20-25%, moderate LVH, unable to exclude RWMA, mild to moderate MR, moderately dilated LV, mildly dilated RV with moderately reduced RVSF, mildly dilated RA, trivial pericardial effusion   Hypertension    Noncompliance    Obesity    Polysubstance abuse (HCC)    a. ongoing cocaine, tobacco, and alcohol abuse    PAST SURGICAL HISTORY:   Past Surgical History:  Procedure Laterality Date   COLONOSCOPY     CORONARY ANGIOPLASTY     ESOPHAGOGASTRODUODENOSCOPY     RIGHT/LEFT HEART CATH AND CORONARY ANGIOGRAPHY N/A 11/19/2017   Procedure: RIGHT/LEFT HEART CATH AND CORONARY ANGIOGRAPHY;  Surgeon: Iran Ouch, MD;  Location: [facility] INVASIVE CV LAB;  Service: Cardiovascular;  Laterality: N/A;    SOCIAL HISTORY:   Social History   Tobacco Use   Smoking status: Current Every Day Smoker    Packs/day: 0.25    Years: 30.00    Pack years: 7.50   Smokeless tobacco: Never Used  Substance Use Topics   Alcohol use: Yes    Comment: "couple shots a day" and beer    FAMILY HISTORY:   Family History  Problem Relation  Age of Onset   Hypertension Mother     DRUG ALLERGIES:  No Known Allergies  REVIEW OF SYSTEMS:   Unable to be performed due to patient condition.  MEDICATIONS AT HOME:   Prior to Admission medications   Medication Sig Start Date End Date Taking? Authorizing Provider  aspirin 81 MG EC tablet Take 1 tablet (81 mg total) by mouth daily. 11/22/17   Enid BaasKalisetti, Radhika, MD  atorvastatin (LIPITOR) 40 MG tablet Take 1 tablet (40 mg total) by mouth daily. 02/08/19   Antonieta IbaGollan, Timothy J, MD  carvedilol (COREG) 6.25 MG tablet Take 1 tablet (6.25 mg total) by mouth 2 (two) times  daily. 02/08/19   Antonieta IbaGollan, Timothy J, MD  Fluticasone-Umeclidin-Vilant (TRELEGY ELLIPTA) 100-62.5-25 MCG/INH AEPB Inhale 1 puff into the lungs daily.    [provider]  folic acid (FOLVITE) 1 MG tablet Take 1 tablet (1 mg total) by mouth daily. 11/21/17   Enid BaasKalisetti, Radhika, MD  furosemide (LASIX) 40 MG tablet Take 1 tablet (40 mg total) by mouth daily. 02/08/19 05/09/19  Antonieta IbaGollan, Timothy J, MD  ibuprofen (ADVIL,MOTRIN) 200 MG tablet Take 200 mg by mouth every 6 (six) hours as needed.    [provider]  magnesium oxide (MAG-OX) 400 (241.3 Mg) MG tablet Take 1 tablet (400 mg total) by mouth daily. 11/22/17   Enid BaasKalisetti, Radhika, MD  potassium chloride SA (K-DUR) 20 MEQ tablet Take 1 tablet (20 mEq total) by mouth daily. 02/08/19   Antonieta IbaGollan, Timothy J, MD  sacubitril-valsartan (ENTRESTO) 49-51 MG Take 1 tablet by mouth 2 (two) times daily. 02/08/19   Antonieta IbaGollan, Timothy J, MD  thiamine (VITAMIN B-1) 100 MG tablet Take 100 mg by mouth daily.    [provider]      VITAL SIGNS:  Blood pressure 102/65, pulse 77, temperature (!) 88.4 F (31.3 C), resp. rate 18, SpO2 96 %.  PHYSICAL EXAMINATION:  Physical Exam Constitutional:      Appearance: He is ill-appearing.  HENT:     Head: Normocephalic.     Right Ear: External ear normal.     Left Ear: External ear normal.     Nose: Nose normal.     Mouth/Throat:     Mouth: Mucous membranes are moist.     Pharynx: Oropharynx is clear.  Eyes:     General: No scleral icterus.    Conjunctiva/sclera: Conjunctivae normal.     Pupils: Pupils are equal, round, and reactive to light.  Neck:     Musculoskeletal: Neck supple.  Cardiovascular:     Rate and Rhythm: Normal rate and regular rhythm.     Pulses: Normal pulses.     Heart sounds: Normal heart sounds. No murmur. No friction rub. No gallop.   Pulmonary:     Effort: Respiratory distress present.     Breath sounds: Rales present. No wheezing.  Abdominal:     General: Abdomen is  flat. Bowel sounds are normal. There is no distension.     Palpations: Abdomen is soft. There is no mass.  Musculoskeletal:     Right lower leg: No edema.     Left lower leg: No edema.  Lymphadenopathy:     Cervical: No cervical adenopathy.  Skin:    Capillary Refill: Capillary refill takes less than 2 seconds.     Coloration: Skin is not jaundiced.     Findings: No lesion or rash.  Neurological:     Comments: Intubated and sedated    LABORATORY PANEL:   CBC Recent  Labs  Lab 2019/06/28 2138  WBC 12.4*  HGB 8.0*  HCT 24.5*  PLT 56*   ------------------------------------------------------------------------------------------------------------------  Chemistries  Recent Labs  Lab Jun 28, 2019 2138  NA 133*  K 4.7  CL 96*  CO2 10*  GLUCOSE <20*  BUN 30*  CREATININE 4.95*  CALCIUM 7.9*  AST 214*  ALT 74*  ALKPHOS 140*  BILITOT 6.9*   ------------------------------------------------------------------------------------------------------------------  Cardiac Enzymes No results for input(s): TROPONINI in the last 168 hours. ------------------------------------------------------------------------------------------------------------------  RADIOLOGY:  Ct Abdomen Pelvis Wo Contrast  Result Date: 28-Jun-2019 CLINICAL DATA:  58 year old male with unresponsiveness. EXAM: CT CHEST, ABDOMEN AND PELVIS WITHOUT CONTRAST TECHNIQUE: Multidetector CT imaging of the chest, abdomen and pelvis was performed following the standard protocol without IV contrast. COMPARISON:  Abdominal ultrasound dated 03/31/2010 and CT dated 03/31/2010 FINDINGS: Evaluation of this exam is limited in the absence of intravenous contrast. CT CHEST FINDINGS Cardiovascular: There is no cardiomegaly or pericardial effusion. Advanced multi vessel coronary vascular calcification noted. There is mild atherosclerotic calcification of the aorta. No aneurysmal dilatation. The central pulmonary arteries are grossly  unremarkable. Mediastinum/Nodes: There is no hilar or mediastinal adenopathy. The esophagus is grossly unremarkable. No mediastinal fluid collection. Lungs/Pleura: There are bibasilar subsegmental atelectasis. Scattered upper lobe predominant hazy airspace densities with peripheral and subpleural distribution most consistent with multifocal pneumonia, possibly atypical etiology. Clinical correlation is recommended. A 2.6 x 1.8 cm focal consolidative area in the right upper lobe likely infectious. Follow-up to resolution recommended to exclude neoplasm. No significant pleural effusion. There is no pneumothorax. The central airways are patent. An endotracheal tube is noted with tip approximately 3.3 cm above the carina. Musculoskeletal: There is degenerative changes of the spine. Degenerative changes of the spine. No acute osseous pathology. CT ABDOMEN PELVIS FINDINGS There is no intra-abdominal free air. Trace free fluid within pelvis. Hepatobiliary: Advanced fatty infiltration of the liver. Slight irregularity of the liver contour may represent early changes of cirrhosis. No intrahepatic biliary ductal dilatation. High attenuating content within the gallbladder most consistent with sludge or hyper concentrated bile. No pericholecystic fluid. Pancreas: There is peripancreatic stranding and inflammatory changes. These findings may be related to underlying liver disease or represent acute pancreatitis. Correlation with pancreatic enzymes recommended. No drainable fluid collection/abscess or pseudocyst. Spleen: Normal in size without focal abnormality. Adrenals/Urinary Tract: The adrenal glands are unremarkable. There is no hydronephrosis or nephrolithiasis on either side. The visualized ureters appear unremarkable. The urinary bladder is decompressed around a Foley catheter. Stomach/Bowel: There is diffuse thickening of the colon. Although this may be related to underlying liver disease findings concerning for  pancolitis. Clinical correlation is recommended. There is no bowel obstruction. The appendix is normal. Vascular/Lymphatic: Moderate aortoiliac atherosclerotic disease. A right femoral venous line with tip in the right external iliac vein is noted. No portal venous gas. There is no adenopathy. Reproductive: The prostate and seminal vesicles are grossly unremarkable. No pelvic mass. Other: Mild diffuse subcutaneous edema. Musculoskeletal: Osteopenia. Moderate bilateral hip osteoarthritic changes. No acute osseous pathology. IMPRESSION: 1. Scattered airspace opacities most consistent with multifocal pneumonia. Clinical correlation and follow-up recommended. 2. Advanced fatty infiltration of the liver with probable early changes of cirrhosis. 3. Inflammatory changes of the upper abdomen may be related to underlying liver disease or acute pancreatitis. Correlation with pancreatic enzymes recommended. 4. Hepatic colopathy versus pancolitis. Clinical correlation is recommended. No bowel obstruction. Normal appendix. 5. Aortic Atherosclerosis (ICD10-I70.0). Electronically Signed   By: Anner Crete M.D.   On: 28-Jun-2019 23:42  Dg Chest 1 View  Result Date: 05/23/2019 CLINICAL DATA:  Post intubation EXAM: CHEST  1 VIEW COMPARISON:  11/14/2017 FINDINGS: Endotracheal tube tip is about 4.7 cm superior to the carina. No consolidation or effusion. No pneumothorax. Normal cardiomediastinal silhouette. IMPRESSION: 1. Endotracheal tube tip about 4.7 cm superior to carina 2. Clear lung fields Electronically Signed   By: Jasmine PangKim  Fujinaga M.D.   On: 05/14/2019 23:04   Ct Head Wo Contrast  Result Date: 05/19/2019 CLINICAL DATA:  Altered level of consciousness EXAM: CT HEAD WITHOUT CONTRAST TECHNIQUE: Contiguous axial images were obtained from the base of the skull through the vertex without intravenous contrast. COMPARISON:  None. FINDINGS: Brain: No acute intracranial abnormality. Specifically, no hemorrhage, hydrocephalus,  mass lesion, acute infarction, or significant intracranial injury. Vascular: No hyperdense vessel or unexpected calcification. Skull: No acute calvarial abnormality. Sinuses/Orbits: No acute finding Other: None IMPRESSION: No acute intracranial abnormality. Electronically Signed   By: Charlett NoseKevin  Dover M.D.   On: 06/11/2019 23:26   Ct Chest Wo Contrast  Result Date: 05/27/2019 CLINICAL DATA:  58 year old male with unresponsiveness. EXAM: CT CHEST, ABDOMEN AND PELVIS WITHOUT CONTRAST TECHNIQUE: Multidetector CT imaging of the chest, abdomen and pelvis was performed following the standard protocol without IV contrast. COMPARISON:  Abdominal ultrasound dated 03/31/2010 and CT dated 03/31/2010 FINDINGS: Evaluation of this exam is limited in the absence of intravenous contrast. CT CHEST FINDINGS Cardiovascular: There is no cardiomegaly or pericardial effusion. Advanced multi vessel coronary vascular calcification noted. There is mild atherosclerotic calcification of the aorta. No aneurysmal dilatation. The central pulmonary arteries are grossly unremarkable. Mediastinum/Nodes: There is no hilar or mediastinal adenopathy. The esophagus is grossly unremarkable. No mediastinal fluid collection. Lungs/Pleura: There are bibasilar subsegmental atelectasis. Scattered upper lobe predominant hazy airspace densities with peripheral and subpleural distribution most consistent with multifocal pneumonia, possibly atypical etiology. Clinical correlation is recommended. A 2.6 x 1.8 cm focal consolidative area in the right upper lobe likely infectious. Follow-up to resolution recommended to exclude neoplasm. No significant pleural effusion. There is no pneumothorax. The central airways are patent. An endotracheal tube is noted with tip approximately 3.3 cm above the carina. Musculoskeletal: There is degenerative changes of the spine. Degenerative changes of the spine. No acute osseous pathology. CT ABDOMEN PELVIS FINDINGS There is no  intra-abdominal free air. Trace free fluid within pelvis. Hepatobiliary: Advanced fatty infiltration of the liver. Slight irregularity of the liver contour may represent early changes of cirrhosis. No intrahepatic biliary ductal dilatation. High attenuating content within the gallbladder most consistent with sludge or hyper concentrated bile. No pericholecystic fluid. Pancreas: There is peripancreatic stranding and inflammatory changes. These findings may be related to underlying liver disease or represent acute pancreatitis. Correlation with pancreatic enzymes recommended. No drainable fluid collection/abscess or pseudocyst. Spleen: Normal in size without focal abnormality. Adrenals/Urinary Tract: The adrenal glands are unremarkable. There is no hydronephrosis or nephrolithiasis on either side. The visualized ureters appear unremarkable. The urinary bladder is decompressed around a Foley catheter. Stomach/Bowel: There is diffuse thickening of the colon. Although this may be related to underlying liver disease findings concerning for pancolitis. Clinical correlation is recommended. There is no bowel obstruction. The appendix is normal. Vascular/Lymphatic: Moderate aortoiliac atherosclerotic disease. A right femoral venous line with tip in the right external iliac vein is noted. No portal venous gas. There is no adenopathy. Reproductive: The prostate and seminal vesicles are grossly unremarkable. No pelvic mass. Other: Mild diffuse subcutaneous edema. Musculoskeletal: Osteopenia. Moderate bilateral hip osteoarthritic changes. No acute osseous  pathology. IMPRESSION: 1. Scattered airspace opacities most consistent with multifocal pneumonia. Clinical correlation and follow-up recommended. 2. Advanced fatty infiltration of the liver with probable early changes of cirrhosis. 3. Inflammatory changes of the upper abdomen may be related to underlying liver disease or acute pancreatitis. Correlation with pancreatic enzymes  recommended. 4. Hepatic colopathy versus pancolitis. Clinical correlation is recommended. No bowel obstruction. Normal appendix. 5. Aortic Atherosclerosis (ICD10-I70.0). Electronically Signed   By: Elgie CollardArash  Radparvar M.D.   On: 05/21/2019 23:42      IMPRESSION AND PLAN:   1.  Acute hypoxic respiratory failure - pH 7.16 - Patient intubated by the ED physician shortly after arrival - Repeat ABG demonstrated pH of 7.15 and bicarbonate 10 - Patient received 2 A bicarbonate and has been started on bicarbonate infusion. - Will repeat ABG at 0700 - Patient has required vasopressor therapy due to hypotension  2.  Elevated INR - With platelet count 56,000 and hemoglobin 8 with hematocrit 24.5 - Patient is receiving 2 units packed red blood cells to be followed by 2 units fresh frozen plasma -We will repeat hemoglobin hematocrit after transfusion -Repeat INR in the a.m. -Patient is not on anticoagulants that we are aware of  3.  Multifocal pneumonia - Felt likely to be community-acquired pneumonia however possibly aspiration related therefore patient has been started on IV Rocephin, azithromycin, and vancomycin.  Will need to monitor renal function closely given his history of chronic renal failure.  4.  History of polysubstance/alcohol abuse -CIWA protocol  5.  Hypoglycemia - Glucose less than 20 on his arrival - He was given IV D50 - We will continue every 2 hour glucose monitoring for now  6.  EtOH hepatitis with hyperammonemia - Patient will need lactulose.  However unable to get OG tube inserted due to esophageal strictures.  PPI prophylaxis initiated      All the records are reviewed and case discussed with ED provider. The plan of care was discussed in details with the patient (and family). I answered all questions. The patient agreed to proceed with the above mentioned plan. Further management will depend upon hospital course.   CODE STATUS: Full code  TOTAL TIME TAKING  CARE OF THIS PATIENT: 45minutes.    Milas Kocherngela H Jody Silas CRNP on 06/11/2019 at 2:39 AM  Pager - [phone]  After 6pm go to [email or web address] - Social research officer, governmentpassword EPAS [facility]  [facility] [place] [facility]  Office  [phone]  CC: Primary care physician; Armando GangLindley, Cheryl P, FNP   Note: This dictation was prepared with Dragon dictation along with smaller phrase technology. Any transcriptional errors that result from this process are unintentional.

## 2019-06-11 NOTE — ED Notes (Signed)
ED TO INPATIENT HANDOFF REPORT  ED Nurse Name and Phone #: Danelle Earthlyoel [phone]  S Name/Age/Gender Marcus Foster 58 y.o. male Room/Bed: ED05A/ED05A  Code Status   Code Status: Full Code  Home/SNF/Other unknown Patient oriented to nothing - pt nonverbal Is this baseline? No   Triage Complete: Triage complete  Chief Complaint Unresponsive  Triage Note .Patient presents to Emergency Department via [facility] from home  with complaints of found unresponsive   History of DM NO hx of hypoglycemic LOC     Allergies No Known Allergies  Level of Care/Admitting Diagnosis ED Disposition    ED Disposition Condition Comment   Admit  Hospital Area: [facility] [[id number]]  Level of Care: ICU [6]  Covid Evaluation: Confirmed COVID Negative  Diagnosis: Acute respiratory failure (HCC) [518.81.ICD-9-CM]  Admitting Physician: Hannah BeatMANSY, JAN A [[id number]]  Attending Physician: Hannah BeatMANSY, JAN A [[id number]]  Estimated length of stay: past midnight tomorrow  Certification:: I certify this patient will need inpatient services for at least 2 midnights  PT Class (Do Not Modify): Inpatient [101]  PT Acc Code (Do Not Modify): Private [1]       B Medical/Surgery History Past Medical History:  Diagnosis Date  . Acid reflux   . Asthma   . Chronic systolic CHF (congestive heart failure) (HCC)    a. TTE 08/2017: EF 20-25%, moderate LVH, unable to exclude RWMA, mild to moderate MR, moderately dilated LV, mildly dilated RV with moderately reduced RVSF, mildly dilated RA, trivial pericardial effusion  . Hypertension   . Noncompliance   . Obesity   . Polysubstance abuse (HCC)    a. ongoing cocaine, tobacco, and alcohol abuse   Past Surgical History:  Procedure Laterality Date  . COLONOSCOPY    . CORONARY ANGIOPLASTY    . ESOPHAGOGASTRODUODENOSCOPY    . RIGHT/LEFT HEART CATH AND CORONARY ANGIOGRAPHY N/A 11/19/2017   Procedure: RIGHT/LEFT HEART CATH AND CORONARY ANGIOGRAPHY;  Surgeon:  Iran OuchArida, Muhammad A, MD;  Location: [facility] INVASIVE CV LAB;  Service: Cardiovascular;  Laterality: N/A;     A IV Location/Drains/Wounds Patient Lines/Drains/Airways Status   Active Line/Drains/Airways    Name:   Placement date:   Placement time:   Site:   Days:   Peripheral IV 02/22/19 Right External jugular   02/22/19    2140    External jugular   1   Peripheral IV 02/22/19 Left External jugular   02/22/19    2230    External jugular   1   CVC Triple Lumen 02/22/19 Right Femoral   02/22/19    2230     1   Urethral Catheter Fleet Contrasachel, RN Temperature probe;Latex 16 Fr.   02/22/19    2300    Temperature probe;Latex   1   Airway   02/22/19    2200     1          Intake/Output Last 24 hours  Intake/Output Summary (Last 24 hours) at 06/11/2019 0319 Last data filed at 06/11/2019 0243 Gross per 24 hour  Intake 1243.34 ml  Output -  Net 1243.34 ml    Labs/Imaging Results for orders placed or performed during the hospital encounter of 02/22/19 (from the past 48 hour(s))  CBC with Differential     Status: Abnormal   Collection Time: 02/22/19  9:38 PM  Result Value Ref Range   WBC 12.4 (H) 4.0 - 10.5 K/uL   RBC 2.53 (L) 4.22 - 5.81 MIL/uL   Hemoglobin 8.0 (L) 13.0 -  17.0 g/dL   HCT 16.1 (L) 09.6 - 04.5 %   MCV 96.8 80.0 - 100.0 fL   MCH 31.6 26.0 - 34.0 pg   MCHC 32.7 30.0 - 36.0 g/dL   RDW 40.9 (H) 81.1 - 91.4 %   Platelets 56 (L) 150 - 400 K/uL    Comment: Immature Platelet Fraction may be clinically indicated, consider ordering this additional test [id number] PLATELETS APPEAR DECREASED    Neutrophils Relative % 89 %   Neutro Abs 11.0 (H) 1.7 - 7.7 K/uL   Lymphocytes Relative 4 %   Lymphs Abs 0.6 (L) 0.7 - 4.0 K/uL   Monocytes Relative 5 %   Monocytes Absolute 0.7 0.1 - 1.0 K/uL   Eosinophils Relative 1 %   Eosinophils Absolute 0.1 0.0 - 0.5 K/uL   Basophils Relative 0 %   Basophils Absolute 0.0 0.0 - 0.1 K/uL   Immature Granulocytes 1 %   Abs Immature Granulocytes 0.08 (H)  0.00 - 0.07 K/uL   Burr Cells PRESENT    Polychromasia PRESENT     Comment: Performed at [facility], [address] [place], [place] [address]  Comprehensive metabolic panel     Status: Abnormal   Collection Time: 05/20/2019  9:38 PM  Result Value Ref Range   Sodium 133 (L) 135 - 145 mmol/L    Comment: LYTES REPEATED/RWW   Potassium 4.7 3.5 - 5.1 mmol/L   Chloride 96 (L) 98 - 111 mmol/L   CO2 10 (L) 22 - 32 mmol/L   Glucose, Bld <20 (LL) 70 - 99 mg/dL    Comment: CRITICAL RESULT CALLED TO, READ BACK BY AND VERIFIED WITH Barby Colvard WEBSTER AT 2233 ON 06/11/2019 MJU    BUN 30 (H) 6 - 20 mg/dL   Creatinine, Ser 7.84 (H) 0.61 - 1.24 mg/dL   Calcium 7.9 (L) 8.9 - 10.3 mg/dL   Total Protein 6.5 6.5 - 8.1 g/dL   Albumin 1.8 (L) 3.5 - 5.0 g/dL   AST 696 (H) 15 - 41 U/L   ALT 74 (H) 0 - 44 U/L   Alkaline Phosphatase 140 (H) 38 - 126 U/L   Total Bilirubin 6.9 (H) 0.3 - 1.2 mg/dL   GFR calc non Af Amer 12 (L) >60 mL/min   GFR calc Af Amer 14 (L) >60 mL/min   Anion gap 27 (H) 5 - 15    Comment: Performed at [facility], [address] [place], [place] [address]  Protime-INR     Status: Abnormal   Collection Time: 05/19/2019  9:38 PM  Result Value Ref Range   Prothrombin Time 55.9 (H) 11.4 - 15.2 seconds   INR 6.5 (HH) 0.8 - 1.2    Comment: CRITICAL RESULT CALLED TO, READ BACK BY AND VERIFIED WITH: Tajia Szeliga WEBSTER  05/29/2019 MJU (NOTE) INR goal varies based on device and disease states. Performed at [facility], [address] [place], [place] [address]   Troponin I (High Sensitivity)     Status: Abnormal   Collection Time: 05/28/2019  9:38 PM  Result Value Ref Range   Troponin I (High Sensitivity) 64 (H) <18 ng/L    Comment: (NOTE) Elevated high sensitivity troponin I (hsTnI) values and significant  changes across serial measurements may suggest ACS but many other  chronic and acute conditions are known to elevate hsTnI results.  Refer to the "Links" section  for chest pain algorithms and additional  guidance. Performed at [facility], [address], [place], [place]  [id number]   Ethanol     Status: None   Collection Time: 06/13/2019  9:38 PM  Result Value Ref Range   Alcohol, Ethyl (B) <10 <10 mg/dL    Comment: (NOTE) Lowest detectable limit for serum alcohol is 10 mg/dL. For medical purposes only. Performed at [facility], [address] [place], [place] [address]   Lactic acid, plasma     Status: Abnormal   Collection Time: 05/16/2019  9:38 PM  Result Value Ref Range   Lactic Acid, Venous >11.0 (HH) 0.5 - 1.9 mmol/L    Comment: CRITICAL RESULT CALLED TO, READ BACK BY AND VERIFIED WITH Jennipher Weatherholtz WEBSTER AT 2218 ON 05/28/2019 RWW Performed at [facility], [address] [place], [place] [address]   SARS Coronavirus 2 [facility](Hospital order, Performed in [facility]) Nasopharyngeal Nasopharyngeal Swab     Status: None   Collection Time: 05/27/2019  9:38 PM   Specimen: Nasopharyngeal Swab  Result Value Ref Range   SARS Coronavirus 2 NEGATIVE NEGATIVE    Comment: (NOTE) If result is NEGATIVE SARS-CoV-2 target nucleic acids are NOT DETECTED. The SARS-CoV-2 RNA is generally detectable in upper and lower  respiratory specimens during the acute phase of infection. The lowest  concentration of SARS-CoV-2 viral copies this assay can detect is 250  copies / mL. A negative result does not preclude SARS-CoV-2 infection  and should not be used as the sole basis for treatment or other  patient management decisions.  A negative result may occur with  improper specimen collection / handling, submission of specimen other  than nasopharyngeal swab, presence of viral mutation(s) within the  areas targeted by this assay, and inadequate number of viral copies  (<250 copies / mL). A negative result must be combined with clinical  observations, patient history, and epidemiological information. If result is  POSITIVE SARS-CoV-2 target nucleic acids are DETECTED. The SARS-CoV-2 RNA is generally detectable in upper and lower  respiratory specimens dur ing the acute phase of infection.  Positive  results are indicative of active infection with SARS-CoV-2.  Clinical  correlation with patient history and other diagnostic information is  necessary to determine patient infection status.  Positive results do  not rule out bacterial infection or co-infection with other viruses. If result is PRESUMPTIVE POSTIVE SARS-CoV-2 nucleic acids MAY BE PRESENT.   A presumptive positive result was obtained on the submitted specimen  and confirmed on repeat testing.  While 2019 novel coronavirus  (SARS-CoV-2) nucleic acids may be present in the submitted sample  additional confirmatory testing may be necessary for epidemiological  and / or clinical management purposes  to differentiate between  SARS-CoV-2 and other Sarbecovirus currently known to infect humans.  If clinically indicated additional testing with an alternate test  methodology [phone]([id number]) is advised. The SARS-CoV-2 RNA is generally  detectable in upper and lower respiratory sp ecimens during the acute  phase of infection. The expected result is Negative. Fact Sheet for Patients:  BoilerBrush.com.cyhttps://www.fda.gov/media/136312/download Fact Sheet for Healthcare Providers: https://pope.com/https://www.fda.gov/media/136313/download This test is not yet approved or cleared by the [place] FDA and has been authorized for detection and/or diagnosis of SARS-CoV-2 by FDA under an Emergency Use Authorization (EUA).  This EUA will remain in effect (meaning this test can be used) for the duration of the COVID-19 declaration under Section 564(b)(1) of the Act, 21 U.S.C. section 360bbb-3(b)(1), unless the authorization is terminated or revoked sooner. Performed at [facility], [address] [place], [place] [address]  Glucose, capillary     Status: Abnormal    Collection Time: 06/05/2019  9:43 PM  Result Value Ref Range   Glucose-Capillary 142 (H) 70 - 99 mg/dL  Blood gas, venous     Status: Abnormal   Collection Time: 05/19/2019  9:47 PM  Result Value Ref Range   pH, Ven 7.27 7.250 - 7.430   pCO2, Ven 21 (L) 44.0 - 60.0 mmHg   pO2, Ven 165.0 (H) 32.0 - 45.0 mmHg   Bicarbonate 9.6 (L) 20.0 - 28.0 mmol/L   Acid-base deficit 15.2 (H) 0.0 - 2.0 mmol/L   O2 Saturation 99.3 %   Patient temperature 37.0    Collection site LINE    Sample type VENOUS     Comment: Performed at [facility], [address] [place], [place] [address]  Blood gas, arterial     Status: Abnormal   Collection Time: 05/14/2019 10:30 PM  Result Value Ref Range   FIO2 0.45    Delivery systems VENTILATOR    Mode ASSIST CONTROL    VT 500 mL   LHR 18 resp/min   Peep/cpap 5.0 cm H20   pH, Arterial 7.16 (LL) 7.350 - 7.450    Comment: CRITICAL RESULT, NOTIFIED PHYSICIAN [facility],RRT    pCO2 arterial 27 (L) 32.0 - 48.0 mmHg   pO2, Arterial 208 (H) 83.0 - 108.0 mmHg   Bicarbonate 9.6 (L) 20.0 - 28.0 mmol/L   Acid-base deficit 17.6 (H) 0.0 - 2.0 mmol/L   O2 Saturation 99.5 %   Patient temperature 37.0    Collection site RIGHT BRACHIAL    Sample type ARTERIAL DRAW    Allens test (pass/fail) PASS PASS    Comment: Performed at [facility], [address] [place], [place] [address]  Glucose, capillary     Status: Abnormal   Collection Time: 06/01/2019 10:40 PM  Result Value Ref Range   Glucose-Capillary 428 (H) 70 - 99 mg/dL  Prepare fresh frozen plasma     Status: None (Preliminary result)   Collection Time: 06/08/2019 10:48 PM  Result Value Ref Range   Unit Number [id number]    Blood Component Type THAWED PLASMA    Unit division 00    Status of Unit ALLOCATED    Transfusion Status      OK TO TRANSFUSE Performed at [facility], [address] [place], [place] [address]   Urinalysis, Complete w Microscopic     Status: Abnormal   Collection  Time: 05/17/2019 11:31 PM  Result Value Ref Range   Color, Urine AMBER (A) YELLOW    Comment: BIOCHEMICALS MAY BE AFFECTED BY COLOR   APPearance CLOUDY (A) CLEAR   Specific Gravity, Urine 1.020 1.005 - 1.030   pH 5.0 5.0 - 8.0   Glucose, UA 50 (A) NEGATIVE mg/dL   Hgb urine dipstick SMALL (A) NEGATIVE   Bilirubin Urine NEGATIVE NEGATIVE   Ketones, ur NEGATIVE NEGATIVE mg/dL   Protein, ur 865100 (A) NEGATIVE mg/dL   Nitrite NEGATIVE NEGATIVE   Leukocytes,Ua NEGATIVE NEGATIVE   RBC / HPF 11-20 0 - 5 RBC/hpf   WBC, UA 0-5 0 - 5 WBC/hpf   Bacteria, UA FEW (A) NONE SEEN   Squamous Epithelial / LPF 0-5 0 - 5   Mucus PRESENT     Comment: Performed at [facility], [address] [place], [place] [address]  Urine Drug Screen, Qualitative ([facility] only)     Status: None   Collection Time: 06/07/2019 11:31 PM  Result  Value Ref Range   Tricyclic, Ur Screen NONE DETECTED NONE DETECTED   Amphetamines, Ur Screen NONE DETECTED NONE DETECTED   MDMA (Ecstasy)Ur Screen NONE DETECTED NONE DETECTED   Cocaine Metabolite,Ur [place] NONE DETECTED NONE DETECTED   Opiate, Ur Screen NONE DETECTED NONE DETECTED   Phencyclidine (PCP) Ur S NONE DETECTED NONE DETECTED   Cannabinoid 50 Ng, Ur  NONE DETECTED NONE DETECTED   Barbiturates, Ur Screen NONE DETECTED NONE DETECTED   Benzodiazepine, Ur Scrn NONE DETECTED NONE DETECTED   Methadone Scn, Ur NONE DETECTED NONE DETECTED    Comment: (NOTE) Tricyclics + metabolites, urine    Cutoff 1000 ng/mL Amphetamines + metabolites, urine  Cutoff 1000 ng/mL MDMA (Ecstasy), urine              Cutoff 500 ng/mL Cocaine Metabolite, urine          Cutoff 300 ng/mL Opiate + metabolites, urine        Cutoff 300 ng/mL Phencyclidine (PCP), urine         Cutoff 25 ng/mL Cannabinoid, urine                 Cutoff 50 ng/mL Barbiturates + metabolites, urine  Cutoff 200 ng/mL Benzodiazepine, urine              Cutoff 200 ng/mL Methadone, urine                   Cutoff 300  ng/mL The urine drug screen provides only a preliminary, unconfirmed analytical test result and should not be used for non-medical purposes. Clinical consideration and professional judgment should be applied to any positive drug screen result due to possible interfering substances. A more specific alternate chemical method must be used in order to obtain a confirmed analytical result. Gas chromatography / mass spectrometry (GC/MS) is the preferred confirmat ory method. Performed at [facility], [address] [place], [place] [address]   Ammonia     Status: Abnormal   Collection Time: 05/20/2019 11:31 PM  Result Value Ref Range   Ammonia 157 (H) 9 - 35 umol/L    Comment: Performed at [facility], [address] [place], [place] [address]  Lactic acid, plasma     Status: Abnormal   Collection Time: 05/18/2019 11:31 PM  Result Value Ref Range   Lactic Acid, Venous >11.0 (HH) 0.5 - 1.9 mmol/L    Comment: CRITICAL RESULT CALLED TO, READ BACK BY AND VERIFIED WITH NOL WEBSTER @026  06/11/2019 TTG Performed at [facility], [address] [place], [place] [address]   Type and screen Ordered by PROVIDER DEFAULT     Status: None (Preliminary result)   Collection Time: 05/29/2019 11:31 PM  Result Value Ref Range   ABO/RH(D) O NEG    Antibody Screen NEG    Sample Expiration 06/13/2019,2359    Unit Number [id number]    Blood Component Type RED CELLS,LR    Unit division 00    Status of Unit ISSUED    Transfusion Status OK TO TRANSFUSE    Crossmatch Result      COMPATIBLE Performed at [facility], [address] [place], [place] [address]    Unit Number [id number]    Blood Component Type RBC LR PHER1    Unit division 00    Status of Unit ISSUED    Transfusion Status OK TO TRANSFUSE    Crossmatch Result COMPATIBLE   Blood gas, arterial     Status: Abnormal  Collection Time: 06/11/19  1:14 AM  Result Value Ref Range   FIO2 0.25    Mode  ASSIST CONTROL    VT 500 mL   Peep/cpap 5.0 cm H20   pH, Arterial 7.14 (LL) 7.350 - 7.450    Comment: CRITICAL RESULT, NOTIFIED PHYSICIAN A SEALS [id number] DT    pCO2 arterial 35 32.0 - 48.0 mmHg   pO2, Arterial 82 (L) 83.0 - 108.0 mmHg   Bicarbonate 10.1 (L) 20.0 - 28.0 mmol/L   Acid-base deficit 19.2 (H) 0.0 - 2.0 mmol/L   O2 Saturation 96.1 %   Patient temperature 31.2    Collection site RIGHT BRACHIAL    Sample type ARTERIAL DRAW    Allens test (pass/fail) PASS PASS   Mechanical Rate 18     Comment: Performed at [facility], [address] [place], [place] [address]  Glucose, capillary     Status: Abnormal   Collection Time: 06/11/19  2:18 AM  Result Value Ref Range   Glucose-Capillary 284 (H) 70 - 99 mg/dL  Prepare RBC (crossmatch)     Status: None   Collection Time: 06/11/19 11:18 PM  Result Value Ref Range   Order Confirmation      ORDER PROCESSED BY BLOOD BANK Performed at [facility], [address] [place], [place] [address]    Ct Abdomen Pelvis Wo Contrast  Result Date: 05/14/2019 CLINICAL DATA:  58 year old male with unresponsiveness. EXAM: CT CHEST, ABDOMEN AND PELVIS WITHOUT CONTRAST TECHNIQUE: Multidetector CT imaging of the chest, abdomen and pelvis was performed following the standard protocol without IV contrast. COMPARISON:  Abdominal ultrasound dated 03/31/2010 and CT dated 03/31/2010 FINDINGS: Evaluation of this exam is limited in the absence of intravenous contrast. CT CHEST FINDINGS Cardiovascular: There is no cardiomegaly or pericardial effusion. Advanced multi vessel coronary vascular calcification noted. There is mild atherosclerotic calcification of the aorta. No aneurysmal dilatation. The central pulmonary arteries are grossly unremarkable. Mediastinum/Nodes: There is no hilar or mediastinal adenopathy. The esophagus is grossly unremarkable. No mediastinal fluid collection. Lungs/Pleura: There are bibasilar subsegmental  atelectasis. Scattered upper lobe predominant hazy airspace densities with peripheral and subpleural distribution most consistent with multifocal pneumonia, possibly atypical etiology. Clinical correlation is recommended. A 2.6 x 1.8 cm focal consolidative area in the right upper lobe likely infectious. Follow-up to resolution recommended to exclude neoplasm. No significant pleural effusion. There is no pneumothorax. The central airways are patent. An endotracheal tube is noted with tip approximately 3.3 cm above the carina. Musculoskeletal: There is degenerative changes of the spine. Degenerative changes of the spine. No acute osseous pathology. CT ABDOMEN PELVIS FINDINGS There is no intra-abdominal free air. Trace free fluid within pelvis. Hepatobiliary: Advanced fatty infiltration of the liver. Slight irregularity of the liver contour may represent early changes of cirrhosis. No intrahepatic biliary ductal dilatation. High attenuating content within the gallbladder most consistent with sludge or hyper concentrated bile. No pericholecystic fluid. Pancreas: There is peripancreatic stranding and inflammatory changes. These findings may be related to underlying liver disease or represent acute pancreatitis. Correlation with pancreatic enzymes recommended. No drainable fluid collection/abscess or pseudocyst. Spleen: Normal in size without focal abnormality. Adrenals/Urinary Tract: The adrenal glands are unremarkable. There is no hydronephrosis or nephrolithiasis on either side. The visualized ureters appear unremarkable. The urinary bladder is decompressed around a Foley catheter. Stomach/Bowel: There is diffuse thickening of the colon. Although this may be related to underlying liver disease findings concerning for pancolitis. Clinical correlation is recommended. There is no  bowel obstruction. The appendix is normal. Vascular/Lymphatic: Moderate aortoiliac atherosclerotic disease. A right femoral venous line with tip  in the right external iliac vein is noted. No portal venous gas. There is no adenopathy. Reproductive: The prostate and seminal vesicles are grossly unremarkable. No pelvic mass. Other: Mild diffuse subcutaneous edema. Musculoskeletal: Osteopenia. Moderate bilateral hip osteoarthritic changes. No acute osseous pathology. IMPRESSION: 1. Scattered airspace opacities most consistent with multifocal pneumonia. Clinical correlation and follow-up recommended. 2. Advanced fatty infiltration of the liver with probable early changes of cirrhosis. 3. Inflammatory changes of the upper abdomen may be related to underlying liver disease or acute pancreatitis. Correlation with pancreatic enzymes recommended. 4. Hepatic colopathy versus pancolitis. Clinical correlation is recommended. No bowel obstruction. Normal appendix. 5. Aortic Atherosclerosis (ICD10-I70.0). Electronically Signed   By: Elgie Collard M.D.   On: 05/21/2019 23:42   Dg Chest 1 View  Result Date: 05/29/2019 CLINICAL DATA:  Post intubation EXAM: CHEST  1 VIEW COMPARISON:  11/14/2017 FINDINGS: Endotracheal tube tip is about 4.7 cm superior to the carina. No consolidation or effusion. No pneumothorax. Normal cardiomediastinal silhouette. IMPRESSION: 1. Endotracheal tube tip about 4.7 cm superior to carina 2. Clear lung fields Electronically Signed   By: Jasmine Pang M.D.   On: 05/14/2019 23:04   Ct Head Wo Contrast  Result Date: 05/18/2019 CLINICAL DATA:  Altered level of consciousness EXAM: CT HEAD WITHOUT CONTRAST TECHNIQUE: Contiguous axial images were obtained from the base of the skull through the vertex without intravenous contrast. COMPARISON:  None. FINDINGS: Brain: No acute intracranial abnormality. Specifically, no hemorrhage, hydrocephalus, mass lesion, acute infarction, or significant intracranial injury. Vascular: No hyperdense vessel or unexpected calcification. Skull: No acute calvarial abnormality. Sinuses/Orbits: No acute finding Other:  None IMPRESSION: No acute intracranial abnormality. Electronically Signed   By: Charlett Nose M.D.   On: 05/28/2019 23:26   Ct Chest Wo Contrast  Result Date: 06/04/2019 CLINICAL DATA:  58 year old male with unresponsiveness. EXAM: CT CHEST, ABDOMEN AND PELVIS WITHOUT CONTRAST TECHNIQUE: Multidetector CT imaging of the chest, abdomen and pelvis was performed following the standard protocol without IV contrast. COMPARISON:  Abdominal ultrasound dated 03/31/2010 and CT dated 03/31/2010 FINDINGS: Evaluation of this exam is limited in the absence of intravenous contrast. CT CHEST FINDINGS Cardiovascular: There is no cardiomegaly or pericardial effusion. Advanced multi vessel coronary vascular calcification noted. There is mild atherosclerotic calcification of the aorta. No aneurysmal dilatation. The central pulmonary arteries are grossly unremarkable. Mediastinum/Nodes: There is no hilar or mediastinal adenopathy. The esophagus is grossly unremarkable. No mediastinal fluid collection. Lungs/Pleura: There are bibasilar subsegmental atelectasis. Scattered upper lobe predominant hazy airspace densities with peripheral and subpleural distribution most consistent with multifocal pneumonia, possibly atypical etiology. Clinical correlation is recommended. A 2.6 x 1.8 cm focal consolidative area in the right upper lobe likely infectious. Follow-up to resolution recommended to exclude neoplasm. No significant pleural effusion. There is no pneumothorax. The central airways are patent. An endotracheal tube is noted with tip approximately 3.3 cm above the carina. Musculoskeletal: There is degenerative changes of the spine. Degenerative changes of the spine. No acute osseous pathology. CT ABDOMEN PELVIS FINDINGS There is no intra-abdominal free air. Trace free fluid within pelvis. Hepatobiliary: Advanced fatty infiltration of the liver. Slight irregularity of the liver contour may represent early changes of cirrhosis. No  intrahepatic biliary ductal dilatation. High attenuating content within the gallbladder most consistent with sludge or hyper concentrated bile. No pericholecystic fluid. Pancreas: There is peripancreatic stranding and inflammatory changes. These findings may  be related to underlying liver disease or represent acute pancreatitis. Correlation with pancreatic enzymes recommended. No drainable fluid collection/abscess or pseudocyst. Spleen: Normal in size without focal abnormality. Adrenals/Urinary Tract: The adrenal glands are unremarkable. There is no hydronephrosis or nephrolithiasis on either side. The visualized ureters appear unremarkable. The urinary bladder is decompressed around a Foley catheter. Stomach/Bowel: There is diffuse thickening of the colon. Although this may be related to underlying liver disease findings concerning for pancolitis. Clinical correlation is recommended. There is no bowel obstruction. The appendix is normal. Vascular/Lymphatic: Moderate aortoiliac atherosclerotic disease. A right femoral venous line with tip in the right external iliac vein is noted. No portal venous gas. There is no adenopathy. Reproductive: The prostate and seminal vesicles are grossly unremarkable. No pelvic mass. Other: Mild diffuse subcutaneous edema. Musculoskeletal: Osteopenia. Moderate bilateral hip osteoarthritic changes. No acute osseous pathology. IMPRESSION: 1. Scattered airspace opacities most consistent with multifocal pneumonia. Clinical correlation and follow-up recommended. 2. Advanced fatty infiltration of the liver with probable early changes of cirrhosis. 3. Inflammatory changes of the upper abdomen may be related to underlying liver disease or acute pancreatitis. Correlation with pancreatic enzymes recommended. 4. Hepatic colopathy versus pancolitis. Clinical correlation is recommended. No bowel obstruction. Normal appendix. 5. Aortic Atherosclerosis (ICD10-I70.0). Electronically Signed   By: Anner Crete M.D.   On: 06/09/2019 23:42    Pending Labs Unresulted Labs (From admission, onward)    Start     Ordered   06/11/19 0700  Blood gas, arterial  Once,   STAT    Question:  Room air or oxygen  Answer:  Oxygen   06/11/19 0251   06/11/19 2774  Basic metabolic panel  Tomorrow morning,   STAT     06/11/19 0251   06/11/19 0500  CBC WITH DIFFERENTIAL  Tomorrow morning,   STAT     06/11/19 0251   06/11/19 0500  Protime-INR  Tomorrow morning,   STAT     06/11/19 0251   06/11/19 0252  HIV antibody (Routine Screening)  Once,   STAT     06/11/19 0251   06/11/19 0252  Urine culture  Once,   STAT    Question:  Patient immune status  Answer:  Normal   06/11/19 0251   06/11/19 0252  Culture, sputum-assessment  (Severe pneumonia (requires ICU care) in adults without resistant organism risk factors )  Once,   STAT    Question:  Patient immune status  Answer:  Normal   06/11/19 0251   06/11/19 0252  Legionella Urine Antigen  (Severe pneumonia (requires ICU care) in adults without resistant organism risk factors )  Once,   STAT     06/11/19 0251   06/11/19 0252  Strep pneumoniae urinary antigen  (Severe pneumonia (requires ICU care) in adults without resistant organism risk factors )  Once,   STAT     06/11/19 0251   06/11/19 0115  Glucose, random  ONCE - STAT,   STAT     06/11/19 0114   05/18/2019 2311  Prepare fresh frozen plasma  (Adult Blood Administration - Fresh Frozen Plasma)  Once,   R    Question Answer Comment  # of Units 1 unit   Transfusion Indications Other (Specify)   Date and time of surgery 8/28      05/31/2019 2311   05/21/2019 2147  Blood culture (routine x 2)  BLOOD CULTURE X 2,   STAT     06/08/2019 2146  Vitals/Pain Today's Vitals   06/11/19 0250 06/11/19 0300 06/11/19 0310 06/11/19 0317  BP: 102/64 101/61 98/62   Pulse: 77 78 78   Resp: Temp: (!) 89.9 F (32.2 C) (!) 90.2 F (32.3 C) (!) 90.4 F (32.4 C)   TempSrc:      SpO2: 96% 96% 96%    Weight:    85 kg  Height:    6' (1.829 m)    Isolation Precautions No active isolations  Medications Medications  norepinephrine (LEVOPHED)  in premix infusion (10 mcg/min Intravenous Rate/Dose Change 06/11/19 0157)  0.9 %  sodium chloride infusion (has no administration in time range)  midazolam (VERSED) 50 mg/50 mL (1 mg/mL) premix infusion (0.5 mg/hr Intravenous New Bag/Given 06/11/19 0005)  sodium chloride 0.225 % with sodium bicarbonate 100 mEq infusion (has no administration in time range)  sodium bicarbonate injection 100 mEq (has no administration in time range)  cefTRIAXone (ROCEPHIN) 2 g in sodium chloride 0.9 % 100 mL IVPB (has no administration in time range)  azithromycin (ZITHROMAX) 500 mg in sodium chloride 0.9 % 250 mL IVPB (has no administration in time range)  LORazepam (ATIVAN) tablet 1 mg (has no administration in time range)    Or  LORazepam (ATIVAN) injection 1 mg (has no administration in time range)  thiamine (VITAMIN B-1) tablet 100 mg (has no administration in time range)    Or  thiamine (B-1) injection 100 mg (has no administration in time range)  folic acid (FOLVITE) tablet 1 mg (has no administration in time range)  multivitamin with minerals tablet 1 tablet (has no administration in time range)  sodium chloride flush (NS) 0.9 % injection 3 mL (has no administration in time range)  sodium chloride flush (NS) 0.9 % injection 3 mL (has no administration in time range)  0.9 %  sodium chloride infusion (has no administration in time range)  dextrose 5% lactated ringers bolus 1,000 mL (0 mLs Intravenous Stopped 05/25/2019 2245)  etomidate (AMIDATE) injection (20 mg Intravenous Given 05/30/2019 2156)  succinylcholine (ANECTINE) injection (100 mg Intravenous Given 06/11/2019 2157)  0.9 %  sodium chloride infusion (10 mL/hr Intravenous New Bag/Given 05/26/2019 2345)  vecuronium (NORCURON) injection (10 mg Intravenous Given 06/05/2019 2252)  LORazepam (ATIVAN) injection  2 mg (2 mg Intravenous Given 06/03/2019 2256)  sodium bicarbonate injection 50 mEq (50 mEq Intravenous Given 06/08/2019 2346)  vancomycin (VANCOCIN) IVPB 1000 mg/200 mL premix ( Intravenous Stopped 06/11/19 0241)  piperacillin-tazobactam (ZOSYN) IVPB 3.375 g (0 g Intravenous Stopped 06/11/19 0129)    Mobility walks     Focused Assessments    R Recommendations: See Admitting Provider Note  Report given to:   Additional Notes: daughter at bedside

## 2019-06-11 NOTE — Progress Notes (Signed)
Pharmacy Antibiotic Note  Marcus Foster is a 58 y.o. male admitted on 05/18/2019 with pneumonia.  Pharmacy has been consulted for vancomycin dosing. Patient was found unresponsive and brought to ED found to have a LA >11.0, hypothermic Tmin 84.1, 4/4 SIRS, CT chest found to have a pneumonia but admission team suspects aspiration.  Plan: Patient received vanc 1g and zosyn 3.375g IV x 1 in ED. Patient has AKI on CKD w/ BL Scr around 1.2 - 1.3 and patient is currently 4.95. Will dose per random levels w/ a goal random < 20 mcg/mL. Will check a random level w/ am labs. Will continue to monitor renal function and adjust as necessary.  Height: 6' (182.9 cm) Weight: 187 lb 6.3 oz (85 kg) IBW/kg (Calculated) : 77.6  Temp (24hrs), Avg:87.5 F (30.8 C), Min:84.1 F (28.9 C), Max:92 F (33.3 C)  Recent Labs  Lab 06/04/2019 2138 05/26/2019 2331  WBC 12.4*  --   CREATININE 4.95*  --   LATICACIDVEN >11.0* >11.0*    Estimated Creatinine Clearance: 17.4 mL/min (A) (by C-G formula based on SCr of 4.95 mg/dL (H)).    No Known Allergies  Thank you for allowing pharmacy to be a part of this patient's care.  Tobie Lords, PharmD, BCPS Clinical Pharmacist 06/11/2019 5:14 AM

## 2019-06-11 NOTE — ED Notes (Signed)
Family at bedside.  Dr Jimmye Norman updated

## 2019-06-11 NOTE — ED Provider Notes (Signed)
Patient with multiorgan system failure, COVID is pending.  I have discussed with a hepatologist at [facility].  He has alcoholic hepatitis and multiorgan failure but is not a candidate at this point for liver transplant.  I will discuss with ICU and hospitalist for admission here.   Earleen Newport, MD 06/11/19 [phone]

## 2019-06-11 NOTE — Progress Notes (Signed)
Pharmacy Electrolyte Monitoring Consult:  Pharmacy consulted to assist in monitoring and replacing electrolytes in this 58 y.o. male admitted on 05/17/2019 with Hypoglycemia and Loss of Consciousness  Patient currently requiring CRRT and mechanical ventilation.   Labs:  Sodium (mmol/L)  Date Value  06/11/2019 139  08/09/2018 141   Potassium (mmol/L)  Date Value  06/11/2019 3.2 (L)   Magnesium (mg/dL)  Date Value  06/11/2019 1.4 (L)   Phosphorus (mg/dL)  Date Value  06/11/2019 5.0 (H)   Calcium (mg/dL)  Date Value  06/11/2019 6.9 (L)   Albumin (g/dL)  Date Value  06/11/2019 1.8 (L)  08/09/2018 4.6   Corrected Calcium: 8.7  Assessment/Plan: Patient receiving Sodium Bicarb infusion at 174mL/hr. Patient receiving 2K CRRT bath.   Will order magnesium 4g IV x 1 and potassium chloride 24mEq Q1hr x 4 doses.   Potassium replacement should only be done after consulting with nephrologist.   Labs ordered Q6hr per CRRT protocol.   Will replace to maintain potassium ~ 4 and goal magnesium ~ 2.   Pharmacy will continue to monitor and adjust per consult.   Rinda Rollyson L 06/11/2019 5:39 PM

## 2019-06-11 NOTE — Progress Notes (Addendum)
Pharmacy Antibiotic Note  Marcus Foster is a 58 y.o. male admitted on 05/22/2019 with pneumonia.  Pharmacy has been consulted for vancomycin dosing. Patient was found unresponsive and brought to ED found to have a LA >11.0, hypothermic Tmin 84.1, 4/4 SIRS, CT chest found to have a pneumonia but admission team suspects aspiration.   8/29- pt on Vanc, CTX, doxy  Plan: Patient received vanc 1g and zosyn 3.375g IV x 1 in ED. Patient has AKI on CKD w/ BL Scr around 1.2 - 1.3 and patient is currently 4.95. Will dose per random levels w/ a goal random < 20 mcg/mL. Will check a random level w/ am labs. Will continue to monitor renal function and adjust as necessary.  8/29 Vancomycin random level= 18 mcg/ml at 0644. 1 gram dose was given @ 0142. MRSA PCR negative. Messaged MD- will discontinue Vancomycin at this time    Height: 6' (182.9 cm) Weight: 170 lb 10.2 oz (77.4 kg) IBW/kg (Calculated) : 77.6  Temp (24hrs), Avg:90.4 F (32.4 C), Min:84.1 F (28.9 C), Max:96.3 F (35.7 C)  Recent Labs  Lab 05/19/2019 2138 06/09/2019 2331 06/11/19 0513 06/11/19 0644  WBC 12.4*  --  13.1*  --   CREATININE 4.95*  --   --   --   LATICACIDVEN >11.0* >11.0*  --  >11.0*  VANCORANDOM  --   --   --  18    Estimated Creatinine Clearance: 17.4 mL/min (A) (by C-G formula based on SCr of 4.95 mg/dL (H)).    No Known Allergies  Thank you for allowing pharmacy to be a part of this patient's care.  Chinita Greenland PharmD Clinical Pharmacist 06/11/2019

## 2019-06-11 NOTE — Consult Note (Signed)
[place] NOTE  Pharmacy Consult for evaluating doses appropriate for CRRT therapy  Indication: new CRRT therapy  No Known Allergies  Patient Measurements: Height: 6' (182.9 cm) Weight: 170 lb 10.2 oz (77.4 kg) IBW/kg (Calculated) : 77.6  Vital Signs: Temp: 100.6 F (38.1 C) (08/29 1430) Temp Source: Bladder (08/29 1430) BP: 104/71 (08/29 1430) Pulse Rate: 125 (08/29 1430) Intake/Output from previous day: 08/28 0701 - 08/29 0700 In: 1813.1 [I.V.:569.8; IV Piggyback:1243.3] Out: -  Intake/Output from this shift: Total I/O In: 225 [I.V.:75; IV Piggyback:150] Out: -   Labs: Recent Labs    06/09/2019 2138 06/11/19 0513  WBC 12.4* 13.1*  HGB 8.0* 10.7*  HCT 24.5* 33.5*  PLT 56* 67*  CREATININE 4.95* 4.79*  MG  --  1.6*  PHOS  --  7.8*  ALBUMIN 1.8* 1.8*  PROT 6.5 6.8  AST 214* 289*  ALT 74* 86*  ALKPHOS 140* 175*  BILITOT 6.9* 7.3*   Estimated Creatinine Clearance: 18 mL/min (A) (by C-G formula based on SCr of 4.79 mg/dL (H)).   Microbiology: Recent Results (from the past 720 hour(s))  SARS Coronavirus 2 [facility] order, Performed in [facility] hospital lab) Nasopharyngeal Nasopharyngeal Swab     Status: None   Collection Time: 06/04/2019  9:38 PM   Specimen: Nasopharyngeal Swab  Result Value Ref Range Status   SARS Coronavirus 2 NEGATIVE NEGATIVE Final    Comment: (NOTE) If result is NEGATIVE SARS-CoV-2 target nucleic acids are NOT DETECTED. The SARS-CoV-2 RNA is generally detectable in upper and lower  respiratory specimens during the acute phase of infection. The lowest  concentration of SARS-CoV-2 viral copies this assay can detect is 250  copies / mL. A negative result does not preclude SARS-CoV-2 infection  and should not be used as the sole basis for treatment or other  patient management decisions.  A negative result may occur with  improper specimen collection / handling, submission of specimen other  than nasopharyngeal swab,  presence of viral mutation(s) within the  areas targeted by this assay, and inadequate number of viral copies  (<250 copies / mL). A negative result must be combined with clinical  observations, patient history, and epidemiological information. If result is POSITIVE SARS-CoV-2 target nucleic acids are DETECTED. The SARS-CoV-2 RNA is generally detectable in upper and lower  respiratory specimens dur ing the acute phase of infection.  Positive  results are indicative of active infection with SARS-CoV-2.  Clinical  correlation with patient history and other diagnostic information is  necessary to determine patient infection status.  Positive results do  not rule out bacterial infection or co-infection with other viruses. If result is PRESUMPTIVE POSTIVE SARS-CoV-2 nucleic acids MAY BE PRESENT.   A presumptive positive result was obtained on the submitted specimen  and confirmed on repeat testing.  While 2019 novel coronavirus  (SARS-CoV-2) nucleic acids may be present in the submitted sample  additional confirmatory testing may be necessary for epidemiological  and / or clinical management purposes  to differentiate between  SARS-CoV-2 and other Sarbecovirus currently known to infect humans.  If clinically indicated additional testing with an alternate test  methodology [phone]) is advised. The SARS-CoV-2 RNA is generally  detectable in upper and lower respiratory sp ecimens during the acute  phase of infection. The expected result is Negative. Fact Sheet for Patients:  StrictlyIdeas.no Fact Sheet for Healthcare Providers: BankingDealers.co.za This test is not yet approved or cleared by the [place] FDA and has been authorized for detection  and/or diagnosis of SARS-CoV-2 by FDA under an Emergency Use Authorization (EUA).  This EUA will remain in effect (meaning this test can be used) for the duration of the COVID-19 declaration under  Section 564(b)(1) of the Act, 21 U.S.C. section 360bbb-3(b)(1), unless the authorization is terminated or revoked sooner. Performed at [facility], [address] [place], [place] [address]   Blood culture (routine x 2)     Status: None (Preliminary result)   Collection Time: 05/14/2019 11:31 PM   Specimen: BLOOD  Result Value Ref Range Status   Specimen Description BLOOD LEFT EXTERNAL JUGULAR  Final   Special Requests   Final    BOTTLES DRAWN AEROBIC AND ANAEROBIC Blood Culture results may not be optimal due to an excessive volume of blood received in culture bottles   Culture   Final    NO GROWTH < 12 HOURS Performed at [facility], [address] [place], [place] [address]    Report Status PENDING  Incomplete  Blood culture (routine x 2)     Status: None (Preliminary result)   Collection Time: 05/14/2019 11:31 PM   Specimen: BLOOD  Result Value Ref Range Status   Specimen Description BLOOD RIGHT FEMORAL ARTERY  Final   Special Requests   Final    BOTTLES DRAWN AEROBIC AND ANAEROBIC Blood Culture results may not be optimal due to an excessive volume of blood received in culture bottles   Culture   Final    NO GROWTH < 12 HOURS Performed at [facility], [address], [place], [place] [address]    Report Status PENDING  Incomplete  MRSA PCR Screening     Status: None   Collection Time: 06/11/19  5:13 AM   Specimen: Nasopharyngeal  Result Value Ref Range Status   MRSA by PCR NEGATIVE NEGATIVE Final    Comment:        The GeneXpert MRSA Assay (FDA approved for NASAL specimens only), is one component of a comprehensive MRSA colonization surveillance program. It is not intended to diagnose MRSA infection nor to guide or monitor treatment for MRSA infections. Performed at [facility], [address] [place], [place] [address]     Medical History: Past Medical History:  Diagnosis Date  . Acid reflux   . Asthma   . Chronic  systolic CHF (congestive heart failure) ([place])    a. TTE 08/2017: EF 20-25%, moderate LVH, unable to exclude RWMA, mild to moderate MR, moderately dilated LV, mildly dilated RV with moderately reduced RVSF, mildly dilated RA, trivial pericardial effusion  . Dysphagia   . Esophageal candidiasis ([place])   . Esophageal stricture   . Hypertension   . Noncompliance   . Obesity   . Osteoarthritis   . Polysubstance abuse ([place])    a. ongoing cocaine, tobacco, and alcohol abuse    Medications:  Scheduled:  . chlorhexidine gluconate (MEDLINE KIT)  15 mL Mouth Rinse BID  . Chlorhexidine Gluconate Cloth  6 each Topical Daily  . folic acid  1 mg Oral Daily  . hydrocortisone sod succinate (SOLU-CORTEF) inj  100 mg Intravenous Q12H  . insulin aspart  0-9 Units Subcutaneous Q4H  . ipratropium-albuterol  3 mL Nebulization Q6H  . mouth rinse  15 mL Mouth Rinse 10 times per day  . multivitamin with minerals  1 tablet Oral Daily  . pantoprazole (PROTONIX) IV  40 mg Intravenous Daily  . sodium chloride flush  3 mL Intravenous Q12H  . thiamine  100 mg Oral Daily   Or  . thiamine  100 mg Intravenous Daily    Assessment:  58 y.o. male admitted on 05/26/2019 with pneumonia. Patient also has a history of CKD.  Creatinine on arrival is 4.95 with a BUN of 30.  Shortly after his arrival, patient VBG was completed with pH of 7.16.  He was intubated and sedated. Nephrology has initiated CRRT  Plan:   Ceftriaxone was adjusted to 2 grams IV every 12 hours  All other medications were evaluated. No other medications required adjustment  Dallie Piles, PharmD 06/11/2019,2:35 PM

## 2019-06-11 NOTE — Consult Note (Signed)
Marcus Foster MRN: [id number] DOB/AGE: [birth year] 58 y.o. Primary Care Physician:Lindley, Jordan Likes, FNP Admit date: 06/04/2019     Chief Complaint:  Chief Complaint  Patient presents with  . Hypoglycemia  . Loss of Consciousness   HPI: Pt  is a 58 year old African [place] male with past medical of chronic systolic CHF( lst echo in Nov 2018 showed EF of 20-25%), alcohol and polysubstance abuse, nonischemic cardiomyopathy, Hypertension, history of lymphedema, and medical noncompliance who was brought to ER via EMS services after being found down in his home by his daughter unresponsive.  HPI dates back to past few days when patient's daughter noticed that he had been more short of breath  with nonproductive cough. Pt daughter no other specific complaints NO c/o of fevers, chills, nausea, vomiting, diarrhea.  No known complaints of chest pain or abdominal pain.    Patient's daughter reports he usually drinks beer and/or liquor on a daily basis.    Upon evaluation in ER pt Ethanol level was less than 10.  Pt's Urine drug screen is negative as well.   Pt has a history of EtOH hepatitis not currently a candidate for liver transplantation.   Patient also has a history of CKD.  Pt was found to be in AKI with Creatinine on arrival is 4.95 with a BUN of 30.  Pt was alos found to be anemic with hemoglobin is 8 with hematocrit 24.5 CTshowed multifocal pneumonia with hepatic colopathy versus pancolitis.    Pt was found to be acidotic  patient VBG  with pH of 7.16.  Pt was intubated and sedated.   Patient was hypotensive and is on vasopressor therapy.  Pt is coagulopathic with INR is 6.  Pt has received 2 units packed red blood cells and 2 units fresh frozen plasma in the ER. Pt was seen in ICU.Pt is sedated , intubated.    Past Medical History:  Diagnosis Date  . Acid reflux   . Asthma   . Chronic systolic CHF (congestive heart failure) ([place])    a. TTE 08/2017: EF 20-25%, moderate LVH,  unable to exclude RWMA, mild to moderate MR, moderately dilated LV, mildly dilated RV with moderately reduced RVSF, mildly dilated RA, trivial pericardial effusion  . Dysphagia   . Esophageal candidiasis ([place])   . Esophageal stricture   . Hypertension   . Noncompliance   . Obesity   . Osteoarthritis   . Polysubstance abuse ([place])    a. ongoing cocaine, tobacco, and alcohol abuse        Family History  Problem Relation Age of Onset  . Hypertension Mother     Social History:  reports that he has been smoking. He has a 7.50 pack-year smoking history. He has never used smokeless tobacco. He reports current alcohol use. He reports current drug use. Drug: Cocaine.   Allergies: No Known Allergies  Medications Prior to Admission  Medication Sig Dispense Refill  . aspirin 81 MG EC tablet Take 1 tablet (81 mg total) by mouth daily. 30 tablet 3  . atorvastatin (LIPITOR) 40 MG tablet Take 1 tablet (40 mg total) by mouth daily. 90 tablet 3  . empagliflozin (JARDIANCE) 10 MG TABS tablet Take 10 mg by mouth daily.    . carvedilol (COREG) 6.25 MG tablet Take 1 tablet (6.25 mg total) by mouth 2 (two) times daily. 180 tablet 3  . Fluticasone-Umeclidin-Vilant (TRELEGY ELLIPTA) 100-62.5-25 MCG/INH AEPB Inhale 1 puff into the lungs daily.    . folic  acid (FOLVITE) 1 MG tablet Take 1 tablet (1 mg total) by mouth daily. 30 tablet 0  . furosemide (LASIX) 40 MG tablet Take 1 tablet (40 mg total) by mouth daily. 90 tablet 3  . ibuprofen (ADVIL,MOTRIN) 200 MG tablet Take 200 mg by mouth every 6 (six) hours as needed.    . magnesium oxide (MAG-OX) 400 (241.3 Mg) MG tablet Take 1 tablet (400 mg total) by mouth daily. 30 tablet 3  . potassium chloride SA (K-DUR) 20 MEQ tablet Take 1 tablet (20 mEq total) by mouth daily. 90 tablet 2  . sacubitril-valsartan (ENTRESTO) 49-51 MG Take 1 tablet by mouth 2 (two) times daily. 60 tablet 11  . thiamine (VITAMIN B-1) 100 MG tablet Take 100 mg by mouth daily.          FXO:VANVB from the symptoms mentioned above,there are no other symptoms referable to all systems reviewed.  . chlorhexidine gluconate (MEDLINE KIT)  15 mL Mouth Rinse BID  . Chlorhexidine Gluconate Cloth  6 each Topical Daily  . folic acid  1 mg Oral Daily  . hydrocortisone sod succinate (SOLU-CORTEF) inj  100 mg Intravenous Q12H  . insulin aspart  0-9 Units Subcutaneous Q4H  . ipratropium-albuterol  3 mL Nebulization Q6H  . mouth rinse  15 mL Mouth Rinse 10 times per day  . multivitamin with minerals  1 tablet Oral Daily  . pantoprazole (PROTONIX) IV  40 mg Intravenous Daily  . sodium chloride flush  3 mL Intravenous Q12H  . thiamine  100 mg Oral Daily   Or  . thiamine  100 mg Intravenous Daily      Physical Exam: Vital signs in last 24 hours: Temp:  [84.1 F (28.9 C)-100.4 F (38 C)] 100.4 F (38 C) (08/29 1200) Pulse Rate:  [57-132] 124 (08/29 1200) Resp:  [18-37] 37 (08/29 1200) BP: (68-151)/(37-131) 97/54 (08/29 1200) SpO2:  [87 %-100 %] 98 % (08/29 1323) FiO2 (%):  [25 %-45 %] 30 % (08/29 1323) Weight:  [77.4 kg-85 kg] 77.4 kg (08/29 0447) Weight change:     Intake/Output from previous day: 08/28 0701 - 08/29 0700 In: 1813.1 [I.V.:569.8; IV Piggyback:1243.3] Out: -  Total I/O In: 225 [I.V.:75; IV Piggyback:150] Out: -    Physical Exam: General- pt is sedated , unable to respond HEENt- Head is atraumatic , normocephalic, ET tube in situ  Resp- No acute REsp distress, Rhonchi+ CVS- S1S2 regular in rate and rhythm, on tachycardiac side GIT- BS+ but decreased, soft, NT, ND EXT- NO LE Edema, NO Cyanosis GU- Foley in situ    Lab Results: CBC Recent Labs    05/15/2019 2138 06/11/19 0513  WBC 12.4* 13.1*  HGB 8.0* 10.7*  HCT 24.5* 33.5*  PLT 56* 67*    BMET Recent Labs    06/09/2019 2138 06/11/19 0513  NA 133* 136  K 4.7 4.1  CL 96* 98  CO2 10* 16*  GLUCOSE <20* 266*  BUN 30* 28*  CREATININE 4.95* 4.79*  CALCIUM 7.9* 7.7*   Anion  gap 136-114 AG =22 Lactate more than 11   Creat trend 2020 4.8--4.95 2019  1.2--1.6 2018   1.1--1.2  MICRO Recent Results (from the past 240 hour(s))  SARS Coronavirus 2 [facility] order, Performed in [facility] hospital lab) Nasopharyngeal Nasopharyngeal Swab     Status: None   Collection Time: 06/06/2019  9:38 PM   Specimen: Nasopharyngeal Swab  Result Value Ref Range Status   SARS Coronavirus 2 NEGATIVE NEGATIVE Final  Comment: (NOTE) If result is NEGATIVE SARS-CoV-2 target nucleic acids are NOT DETECTED. The SARS-CoV-2 RNA is generally detectable in upper and lower  respiratory specimens during the acute phase of infection. The lowest  concentration of SARS-CoV-2 viral copies this assay can detect is 250  copies / mL. A negative result does not preclude SARS-CoV-2 infection  and should not be used as the sole basis for treatment or other  patient management decisions.  A negative result may occur with  improper specimen collection / handling, submission of specimen other  than nasopharyngeal swab, presence of viral mutation(s) within the  areas targeted by this assay, and inadequate number of viral copies  (<250 copies / mL). A negative result must be combined with clinical  observations, patient history, and epidemiological information. If result is POSITIVE SARS-CoV-2 target nucleic acids are DETECTED. The SARS-CoV-2 RNA is generally detectable in upper and lower  respiratory specimens dur ing the acute phase of infection.  Positive  results are indicative of active infection with SARS-CoV-2.  Clinical  correlation with patient history and other diagnostic information is  necessary to determine patient infection status.  Positive results do  not rule out bacterial infection or co-infection with other viruses. If result is PRESUMPTIVE POSTIVE SARS-CoV-2 nucleic acids MAY BE PRESENT.   A presumptive positive result was obtained on the submitted specimen  and confirmed  on repeat testing.  While 2019 novel coronavirus  (SARS-CoV-2) nucleic acids may be present in the submitted sample  additional confirmatory testing may be necessary for epidemiological  and / or clinical management purposes  to differentiate between  SARS-CoV-2 and other Sarbecovirus currently known to infect humans.  If clinically indicated additional testing with an alternate test  methodology [phone]) is advised. The SARS-CoV-2 RNA is generally  detectable in upper and lower respiratory sp ecimens during the acute  phase of infection. The expected result is Negative. Fact Sheet for Patients:  StrictlyIdeas.no Fact Sheet for Healthcare Providers: BankingDealers.co.za This test is not yet approved or cleared by the [place] FDA and has been authorized for detection and/or diagnosis of SARS-CoV-2 by FDA under an Emergency Use Authorization (EUA).  This EUA will remain in effect (meaning this test can be used) for the duration of the COVID-19 declaration under Section 564(b)(1) of the Act, 21 U.S.C. section 360bbb-3(b)(1), unless the authorization is terminated or revoked sooner. Performed at [facility], [address] [place], [place] [address]   Blood culture (routine x 2)     Status: None (Preliminary result)   Collection Time: 05/28/2019 11:31 PM   Specimen: BLOOD  Result Value Ref Range Status   Specimen Description BLOOD LEFT EXTERNAL JUGULAR  Final   Special Requests   Final    BOTTLES DRAWN AEROBIC AND ANAEROBIC Blood Culture results may not be optimal due to an excessive volume of blood received in culture bottles   Culture   Final    NO GROWTH < 12 HOURS Performed at [facility], [address], [place], [place] [address]    Report Status PENDING  Incomplete  Blood culture (routine x 2)     Status: None (Preliminary result)   Collection Time: 06/04/2019 11:31 PM   Specimen: BLOOD  Result Value Ref  Range Status   Specimen Description BLOOD RIGHT FEMORAL ARTERY  Final   Special Requests   Final    BOTTLES DRAWN AEROBIC AND ANAEROBIC Blood Culture results may not be optimal due to an excessive volume of blood  received in culture bottles   Culture   Final    NO GROWTH < 12 HOURS Performed at [facility], [address] [place], [place] [address]    Report Status PENDING  Incomplete  MRSA PCR Screening     Status: None   Collection Time: 06/11/19  5:13 AM   Specimen: Nasopharyngeal  Result Value Ref Range Status   MRSA by PCR NEGATIVE NEGATIVE Final    Comment:        The GeneXpert MRSA Assay (FDA approved for NASAL specimens only), is one component of a comprehensive MRSA colonization surveillance program. It is not intended to diagnose MRSA infection nor to guide or monitor treatment for MRSA infections. Performed at [facility], [address] [place], [place] [address]       Lab Results  Component Value Date   CALCIUM 7.7 (L) 06/11/2019   PHOS 7.8 (H) 06/11/2019      Impression: 1)Renal  AKI secondary to ATN               ATN sec to hypotension/septic ? shock               Oliguric ATN               AKI on CKD               CKD stage 3.               CKD since 2018               CKD secondary to HTN                Progression of CKD marked with AKI                Proteinura   Present in U/a since 2018  2)CVS    Hemodynamically unstable               Pt is on two  pressors  3)Anemia HGb at goal (9--11)   Received PRBC  4)CKD Mineral-Bone Disorder  Phosphorus not at goal. Sec to ATN  5)Resp-admitted with acute resp failiure  Pt is currently intubated  Critical care  MD is following  6)Electrolytes  Normokalemic NOrmonatremic   7)Acid base Co2 not at goal Anion gap acidosis sec to lactic acidosis  8) ID- admitted with septic shock        Pneumonia        On IV ABX  9) CVS- Trops high   10) Liver- pt admitted  liver failure         Ammonia high         Bili high   Plan:  Pt with Oliguric ATN in presence of acidosis . Pt is on two pressors. Pt will need CRRT Will not remove any fluid and pt is hypovolemic I had extensive discussion with the pt family to apprise him of his poor prognosis, risks vs benefits of CRRT. Pt family ( daughter) voices understanding.       [place]ost[place] S 06/11/2019, 1:41 PM

## 2019-06-11 NOTE — Progress Notes (Signed)
Unable to locate NP in ed to give results of abg. Paged [phone]. Awaiting return call to give critical results.

## 2019-06-11 NOTE — Progress Notes (Signed)
PT Cancellation Note  Patient Details Name: Marcus Foster MRN: [id number] DOB: [birth year]   Cancelled Treatment:    Reason Eval/Treat Not Completed: Other (comment). PT order received and chart reviewed. Pt is currently intubated/sedated with poor prognosis. Will complete current order at this time. Please re-consult if pt becomes appropriate for PT.   Kelci Petrella 06/11/2019, 10:22 AM  Greggory Stallion, PT, DPT [phone]

## 2019-06-11 NOTE — ED Notes (Signed)
CRITICAL LAB: LACTIC ACID is >11, [facility], Dr. Jimmye Norman notified, orders received

## 2019-06-12 ENCOUNTER — Inpatient Hospital Stay: Payer: Medicaid Other

## 2019-06-12 DIAGNOSIS — N179 Acute kidney failure, unspecified: Secondary | ICD-10-CM

## 2019-06-12 DIAGNOSIS — I420 Dilated cardiomyopathy: Secondary | ICD-10-CM

## 2019-06-12 DIAGNOSIS — K7201 Acute and subacute hepatic failure with coma: Secondary | ICD-10-CM

## 2019-06-12 DIAGNOSIS — I4581 Long QT syndrome: Secondary | ICD-10-CM

## 2019-06-12 LAB — PREPARE FRESH FROZEN PLASMA: Unit division: 0

## 2019-06-12 LAB — BASIC METABOLIC PANEL
Anion gap: 35 — ABNORMAL HIGH (ref 5–15)
BUN: 30 mg/dL — ABNORMAL HIGH (ref 6–20)
CO2: 13 mmol/L — ABNORMAL LOW (ref 22–32)
Calcium: 6.5 mg/dL — ABNORMAL LOW (ref 8.9–10.3)
Chloride: 93 mmol/L — ABNORMAL LOW (ref 98–111)
Creatinine, Ser: 5.22 mg/dL — ABNORMAL HIGH (ref 0.61–1.24)
GFR calc Af Amer: 13 mL/min — ABNORMAL LOW (ref >60)
GFR calc non Af Amer: 11 mL/min — ABNORMAL LOW (ref >60)
Glucose, Bld: 46 mg/dL — ABNORMAL LOW (ref 70–99)
Potassium: 4.7 mmol/L (ref 3.5–5.1)
Sodium: 141 mmol/L (ref 135–145)

## 2019-06-12 LAB — RENAL FUNCTION PANEL
Albumin: 1.4 g/dL — ABNORMAL LOW (ref 3.5–5.0)
Albumin: 1.6 g/dL — ABNORMAL LOW (ref 3.5–5.0)
Albumin: 1.5 g/dL — ABNORMAL LOW (ref 3.5–5.0)
Anion gap: 31 — ABNORMAL HIGH (ref 5–15)
Anion gap: 27 — ABNORMAL HIGH (ref 5–15)
Anion gap: 27 — ABNORMAL HIGH (ref 5–15)
BUN: 25 mg/dL — ABNORMAL HIGH (ref 6–20)
BUN: 19 mg/dL (ref 6–20)
BUN: 15 mg/dL (ref 6–20)
CO2: 14 mmol/L — ABNORMAL LOW (ref 22–32)
CO2: 14 mmol/L — ABNORMAL LOW (ref 22–32)
CO2: 11 mmol/L — ABNORMAL LOW (ref 22–32)
Calcium: 6.4 mg/dL — CL (ref 8.9–10.3)
Calcium: 6.5 mg/dL — ABNORMAL LOW (ref 8.9–10.3)
Calcium: 6.1 mg/dL — CL (ref 8.9–10.3)
Chloride: 96 mmol/L — ABNORMAL LOW (ref 98–111)
Chloride: 98 mmol/L (ref 98–111)
Chloride: 98 mmol/L (ref 98–111)
Creatinine, Ser: 4.04 mg/dL — ABNORMAL HIGH (ref 0.61–1.24)
Creatinine, Ser: 3.04 mg/dL — ABNORMAL HIGH (ref 0.61–1.24)
Creatinine, Ser: 2.63 mg/dL — ABNORMAL HIGH (ref 0.61–1.24)
GFR calc Af Amer: 17 mL/min — ABNORMAL LOW (ref >60)
GFR calc Af Amer: 25 mL/min — ABNORMAL LOW (ref >60)
GFR calc Af Amer: 29 mL/min — ABNORMAL LOW (ref >60)
GFR calc non Af Amer: 15 mL/min — ABNORMAL LOW (ref >60)
GFR calc non Af Amer: 21 mL/min — ABNORMAL LOW (ref >60)
GFR calc non Af Amer: 25 mL/min — ABNORMAL LOW (ref >60)
Glucose, Bld: 149 mg/dL — ABNORMAL HIGH (ref 70–99)
Glucose, Bld: 109 mg/dL — ABNORMAL HIGH (ref 70–99)
Glucose, Bld: 62 mg/dL — ABNORMAL LOW (ref 70–99)
Phosphorus: 7.1 mg/dL — ABNORMAL HIGH (ref 2.5–4.6)
Phosphorus: 6.6 mg/dL — ABNORMAL HIGH (ref 2.5–4.6)
Phosphorus: 8.3 mg/dL — ABNORMAL HIGH (ref 2.5–4.6)
Potassium: 4.8 mmol/L (ref 3.5–5.1)
Potassium: 4.8 mmol/L (ref 3.5–5.1)
Potassium: 6.7 mmol/L (ref 3.5–5.1)
Sodium: 141 mmol/L (ref 135–145)
Sodium: 139 mmol/L (ref 135–145)
Sodium: 136 mmol/L (ref 135–145)

## 2019-06-12 LAB — GLUCOSE, CAPILLARY
Glucose-Capillary: 110 mg/dL — ABNORMAL HIGH (ref 70–99)
Glucose-Capillary: 32 mg/dL — CL (ref 70–99)
Glucose-Capillary: 53 mg/dL — ABNORMAL LOW (ref 70–99)
Glucose-Capillary: 55 mg/dL — ABNORMAL LOW (ref 70–99)
Glucose-Capillary: 78 mg/dL (ref 70–99)
Glucose-Capillary: 99 mg/dL (ref 70–99)

## 2019-06-12 LAB — CBC
HCT: 28.1 % — ABNORMAL LOW (ref 39.0–52.0)
Hemoglobin: 9 g/dL — ABNORMAL LOW (ref 13.0–17.0)
MCH: 31.9 pg (ref 26.0–34.0)
MCHC: 32 g/dL (ref 30.0–36.0)
MCV: 99.6 fL (ref 80.0–100.0)
Platelets: 41 10*3/uL — ABNORMAL LOW (ref 150–400)
RBC: 2.82 MIL/uL — ABNORMAL LOW (ref 4.22–5.81)
RDW: 17.2 % — ABNORMAL HIGH (ref 11.5–15.5)
WBC: 16.2 10*3/uL — ABNORMAL HIGH (ref 4.0–10.5)
nRBC: 2.3 % — ABNORMAL HIGH (ref 0.0–0.2)

## 2019-06-12 LAB — BLOOD GAS, VENOUS
Acid-base deficit: 19.3 mmol/L — ABNORMAL HIGH (ref 0.0–2.0)
Bicarbonate: 11.8 mmol/L — ABNORMAL LOW (ref 20.0–28.0)
O2 Saturation: 58.2 %
Patient temperature: 37
pCO2, Ven: 48 mmHg (ref 44.0–60.0)
pH, Ven: 7 — CL (ref 7.250–7.430)
pO2, Ven: 48 mmHg — ABNORMAL HIGH (ref 32.0–45.0)

## 2019-06-12 LAB — BPAM FFP
Blood Product Expiration Date: 202009022359
ISSUE DATE / TIME: 202008291401
Unit Type and Rh: 9500

## 2019-06-12 LAB — MAGNESIUM
Magnesium: 2.1 mg/dL (ref 1.7–2.4)
Magnesium: 2.2 mg/dL (ref 1.7–2.4)
Magnesium: 2.4 mg/dL (ref 1.7–2.4)
Magnesium: 2.4 mg/dL (ref 1.7–2.4)
Magnesium: 2.8 mg/dL — ABNORMAL HIGH (ref 1.7–2.4)

## 2019-06-12 LAB — LACTIC ACID, PLASMA
Lactic Acid, Venous: >11 mmol/L (ref 0.5–1.9)
Lactic Acid, Venous: >11 mmol/L (ref 0.5–1.9)

## 2019-06-12 LAB — PROCALCITONIN: Procalcitonin: 3.8 ng/mL

## 2019-06-12 LAB — APTT: aPTT: 70 seconds — ABNORMAL HIGH (ref 24–36)

## 2019-06-12 LAB — PHOSPHORUS
Phosphorus: 7.1 mg/dL — ABNORMAL HIGH (ref 2.5–4.6)
Phosphorus: 7.7 mg/dL — ABNORMAL HIGH (ref 2.5–4.6)

## 2019-06-12 LAB — PROTIME-INR
INR: 4 — ABNORMAL HIGH (ref 0.8–1.2)
Prothrombin Time: 38.3 seconds — ABNORMAL HIGH (ref 11.4–15.2)

## 2019-06-12 MED ORDER — CALCIUM GLUCONATE 10 % IV SOLN
1.0000 g | Freq: Once | INTRAVENOUS | Status: AC
  Administered 2019-06-12: 19:00:00 1 g via INTRAVENOUS
  Filled 2019-06-12: qty 10

## 2019-06-12 MED ORDER — SODIUM BICARBONATE 8.4 % IV SOLN
INTRAVENOUS | Status: DC
  Administered 2019-06-12 (×2): via INTRAVENOUS
  Filled 2019-06-12 (×6): qty 150

## 2019-06-12 MED ORDER — PUREFLOW DIALYSIS SOLUTION
INTRAVENOUS | Status: DC
  Administered 2019-06-12: 19:00:00 via INTRAVENOUS_CENTRAL

## 2019-06-12 MED ORDER — METHYLPREDNISOLONE SODIUM SUCC 125 MG IJ SOLR
125.0000 mg | Freq: Once | INTRAMUSCULAR | Status: AC
  Administered 2019-06-12: 125 mg via INTRAVENOUS

## 2019-06-12 MED ORDER — ALBUMIN HUMAN 25 % IV SOLN
50.0000 g | Freq: Once | INTRAVENOUS | Status: AC
  Administered 2019-06-12: 19:00:00 50 g via INTRAVENOUS

## 2019-06-12 MED ORDER — SODIUM BICARBONATE 8.4 % IV SOLN
INTRAVENOUS | Status: AC
  Filled 2019-06-12: qty 50

## 2019-06-12 MED ORDER — DEXTROSE 50 % IV SOLN
25.0000 g | Freq: Once | INTRAVENOUS | Status: AC
  Administered 2019-06-12: 05:00:00 25 g via INTRAVENOUS
  Filled 2019-06-12: qty 50

## 2019-06-12 MED ORDER — SODIUM CHLORIDE 0.9 % IV SOLN
2.0000 g | INTRAVENOUS | Status: DC
  Administered 2019-06-12: 2 g via INTRAVENOUS
  Filled 2019-06-12: qty 2

## 2019-06-12 MED ORDER — SODIUM BICARBONATE 8.4 % IV SOLN
150.0000 meq | Freq: Once | INTRAVENOUS | Status: AC
  Administered 2019-06-12: 150 meq via INTRAVENOUS
  Filled 2019-06-12: qty 150

## 2019-06-12 MED ORDER — ALBUMIN HUMAN 25 % IV SOLN
50.0000 g | Freq: Once | INTRAVENOUS | Status: AC
  Administered 2019-06-12: 09:00:00 50 g via INTRAVENOUS
  Filled 2019-06-12: qty 50

## 2019-06-12 MED ORDER — SODIUM BICARBONATE 8.4 % IV SOLN
50.0000 meq | Freq: Once | INTRAVENOUS | Status: AC
  Administered 2019-06-12: 19:00:00 50 meq via INTRAVENOUS

## 2019-06-12 MED ORDER — DEXTROSE 50 % IV SOLN
1.0000 | Freq: Once | INTRAVENOUS | Status: AC
  Administered 2019-06-12: 08:00:00 50 mL via INTRAVENOUS

## 2019-06-12 MED ORDER — DEXTROSE 50 % IV SOLN
INTRAVENOUS | Status: AC
  Administered 2019-06-12: 18:00:00 50 mL via INTRAVENOUS
  Filled 2019-06-12: qty 50

## 2019-06-12 MED ORDER — DEXTROSE 50 % IV SOLN
50.0000 mL | Freq: Once | INTRAVENOUS | Status: AC
  Administered 2019-06-12: 50 mL via INTRAVENOUS

## 2019-06-12 MED ORDER — DEXTROSE 50 % IV SOLN
INTRAVENOUS | Status: AC
  Administered 2019-06-12: 08:00:00 50 mL via INTRAVENOUS
  Filled 2019-06-12: qty 50

## 2019-06-12 MED ORDER — DEXTROSE 50 % IV SOLN
1.0000 | Freq: Once | INTRAVENOUS | Status: AC
  Administered 2019-06-12: 18:00:00 50 mL via INTRAVENOUS

## 2019-06-12 MED ORDER — CALCIUM GLUCONATE-NACL 1-0.675 GM/50ML-% IV SOLN
1.0000 g | Freq: Once | INTRAVENOUS | Status: AC
  Administered 2019-06-12: 1000 mg via INTRAVENOUS
  Filled 2019-06-12: qty 50

## 2019-06-12 MED ORDER — DEXTROSE 50 % IV SOLN
INTRAVENOUS | Status: AC
  Filled 2019-06-12: qty 50

## 2019-06-12 MED ORDER — DEXTROSE 50 % IV SOLN: 1.0000 | Freq: Once | INTRAVENOUS | Status: AC

## 2019-06-14 LAB — CULTURE, RESPIRATORY W GRAM STAIN: Culture: NORMAL

## 2019-06-14 LAB — HIV ANTIBODY (ROUTINE TESTING W REFLEX): HIV Screen 4th Generation wRfx: NONREACTIVE

## 2019-06-14 NOTE — Progress Notes (Signed)
Patient pronounced at 1944 by Harless Nakayama NP, family at bedside. Had made patient a DNR.

## 2019-06-14 NOTE — Progress Notes (Addendum)
Pharmacy Electrolyte Monitoring Consult:  Pharmacy consulted to assist in monitoring and replacing electrolytes in this 58 y.o. male admitted on 22-Jun-2019 with Hypoglycemia and Loss of Consciousness  Patient currently requiring CRRT and mechanical ventilation.   Labs:  Sodium (mmol/L)  Date Value  06/01/2019 141  08/09/2018 141   Potassium (mmol/L)  Date Value  05/31/2019 4.8   Magnesium (mg/dL)  Date Value  06/08/2019 2.4  06/08/2019 2.4   Phosphorus (mg/dL)  Date Value  05/17/2019 7.1 (H)  05/30/2019 7.1 (H)   Calcium (mg/dL)  Date Value  06/08/2019 6.4 (LL)   Albumin (g/dL)  Date Value  06/01/2019 1.4 (L)  08/09/2018 4.6   Corrected Calcium: 8.7  Assessment/Plan: Patient receiving Sodium Bicarb infusion at 187mL/hr. Patient receiving 2K CRRT bath.   K 3.8, Mag 2.4 Phos 7.1,  Ca 6.4  Album 1.4 Corrected Ca 8.5 NP has ordered Ca. Gluc 1 gm IV x 1 no other replacement needed at this time, will continue to monitor.  Potassium replacement should only be done after consulting with nephrologist.   Labs ordered Q6hr per CRRT protocol.  Will replace to maintain potassium ~ 4 and goal magnesium ~ 2.   Pharmacy will continue to monitor and adjust per consult.   Chinita Greenland PharmD Clinical Pharmacist 05/14/2019

## 2019-06-14 NOTE — Progress Notes (Signed)
Patient left floor with orderly to morgue. Family took his personal belongings. Administrative Coordinator Delora Fuel has funeral information. Released by CDS.

## 2019-06-14 NOTE — Progress Notes (Signed)
Pharmacy Electrolyte Monitoring Consult:  Pharmacy consulted to assist in monitoring and replacing electrolytes in this 58 y.o. male admitted on 06/11/2019 with Hypoglycemia and Loss of Consciousness  Patient currently requiring CRRT and mechanical ventilation.   Labs:  Sodium (mmol/L)  Date Value  Jul 06, 2019 139  08/09/2018 141   Potassium (mmol/L)  Date Value  07-06-19 4.8   Magnesium (mg/dL)  Date Value  06-Jul-2019 2.2   Phosphorus (mg/dL)  Date Value  07/06/19 6.6 (H)   Calcium (mg/dL)  Date Value  07-06-19 6.5 (L)   Albumin (g/dL)  Date Value  Jul 06, 2019 1.6 (L)  08/09/2018 4.6   Corrected Calcium: 8.7  Assessment/Plan: Patient receiving Sodium Bicarb infusion at 127mL/hr. Patient receiving 2K CRRT bath.   K 3.8, Mag 2.2 Phos 6.6,  Ca 6.5  Album 1.6 Corrected Ca 8.4 NP has ordered Calcium Gluconate 1 gm IV x 1 no other replacement needed at this time, will continue to monitor.  Potassium replacement should only be done after consulting with nephrologist.   Labs ordered Q6hr per CRRT protocol.  Will replace to maintain potassium ~ 4 and goal magnesium ~ 2.   Pharmacy will continue to monitor and adjust per consult.   Chinita Greenland PharmD Clinical Pharmacist 2019-07-06

## 2019-06-14 NOTE — Progress Notes (Signed)
[employer] [facility] [place] [facility]   PATIENT NAME: Marcus Foster    MR#:  [id number]  DATE OF BIRTH:  [birth year]  SUBJECTIVE:   Patient remains critically ill on ventilator   REVIEW OF SYSTEMS:  Unable to obtain  Tolerating Diet: npo      DRUG ALLERGIES:  No Known Allergies  VITALS:  Blood pressure (!) 78/64, pulse 95, temperature 97.7 F (36.5 C), resp. rate (!) 23, height 6' (1.829 m), weight 84.8 kg, SpO2 95 %.  PHYSICAL EXAMINATION:  Constitutional: Appears critically ill Intubated sedated. HENT: Normocephalic. Marland Kitchen. Oropharynx is clear and moist.  Eyes:++ SCleral icterus.  Sluggish pupils  neck: No tracheal deviation. CVS tachycardic, S1/S2 +, no murmurs, no gallops, no carotid bruit.  Pulmonary: Patient intubated with bilateral rhonchi .  Abdominal: Soft. BS +,  no distension, tenderness, rebound or guarding.  Musculoskeletal: Normal range of motion. No edema and no tenderness.  Neuro: Sedated on vent. Skin: . No rash noted. Psychiatric: Sedated on vent     LABORATORY PANEL:   CBC Recent Labs  Lab 05/15/2019 0532  WBC 16.2*  HGB 9.0*  HCT 28.1*  PLT 41*   ------------------------------------------------------------------------------------------------------------------  Chemistries  Recent Labs  Lab 06/11/19 0513  05/20/2019 0532  NA 136   < > 141  K 4.1   < > 4.8  CL 98   < > 96*  CO2 16*   < > 14*  GLUCOSE 266*   < > 149*  BUN 28*   < > 25*  CREATININE 4.79*   < > 4.04*  CALCIUM 7.7*   < > 6.4*  MG 1.6*   < > 2.4  2.4  AST 289*  --   --   ALT 86*  --   --   ALKPHOS 175*  --   --   BILITOT 7.3*  --   --    < > = values in this interval not displayed.   ------------------------------------------------------------------------------------------------------------------  Cardiac Enzymes No results for input(s): TROPONINI in the last 168  hours. ------------------------------------------------------------------------------------------------------------------  RADIOLOGY:  Ct Abdomen Pelvis Wo Contrast  Result Date: August 03, 2019 CLINICAL DATA:  58 year old male with unresponsiveness. EXAM: CT CHEST, ABDOMEN AND PELVIS WITHOUT CONTRAST TECHNIQUE: Multidetector CT imaging of the chest, abdomen and pelvis was performed following the standard protocol without IV contrast. COMPARISON:  Abdominal ultrasound dated 03/31/2010 and CT dated 03/31/2010 FINDINGS: Evaluation of this exam is limited in the absence of intravenous contrast. CT CHEST FINDINGS Cardiovascular: There is no cardiomegaly or pericardial effusion. Advanced multi vessel coronary vascular calcification noted. There is mild atherosclerotic calcification of the aorta. No aneurysmal dilatation. The central pulmonary arteries are grossly unremarkable. Mediastinum/Nodes: There is no hilar or mediastinal adenopathy. The esophagus is grossly unremarkable. No mediastinal fluid collection. Lungs/Pleura: There are bibasilar subsegmental atelectasis. Scattered upper lobe predominant hazy airspace densities with peripheral and subpleural distribution most consistent with multifocal pneumonia, possibly atypical etiology. Clinical correlation is recommended. A 2.6 x 1.8 cm focal consolidative area in the right upper lobe likely infectious. Follow-up to resolution recommended to exclude neoplasm. No significant pleural effusion. There is no pneumothorax. The central airways are patent. An endotracheal tube is noted with tip approximately 3.3 cm above the carina. Musculoskeletal: There is degenerative changes of the spine. Degenerative changes of the spine. No acute osseous pathology. CT ABDOMEN PELVIS FINDINGS There is no intra-abdominal free air. Trace free fluid within pelvis. Hepatobiliary: Advanced fatty infiltration of the liver. Slight irregularity of the liver contour  may represent early changes of  cirrhosis. No intrahepatic biliary ductal dilatation. High attenuating content within the gallbladder most consistent with sludge or hyper concentrated bile. No pericholecystic fluid. Pancreas: There is peripancreatic stranding and inflammatory changes. These findings may be related to underlying liver disease or represent acute pancreatitis. Correlation with pancreatic enzymes recommended. No drainable fluid collection/abscess or pseudocyst. Spleen: Normal in size without focal abnormality. Adrenals/Urinary Tract: The adrenal glands are unremarkable. There is no hydronephrosis or nephrolithiasis on either side. The visualized ureters appear unremarkable. The urinary bladder is decompressed around a Foley catheter. Stomach/Bowel: There is diffuse thickening of the colon. Although this may be related to underlying liver disease findings concerning for pancolitis. Clinical correlation is recommended. There is no bowel obstruction. The appendix is normal. Vascular/Lymphatic: Moderate aortoiliac atherosclerotic disease. A right femoral venous line with tip in the right external iliac vein is noted. No portal venous gas. There is no adenopathy. Reproductive: The prostate and seminal vesicles are grossly unremarkable. No pelvic mass. Other: Mild diffuse subcutaneous edema. Musculoskeletal: Osteopenia. Moderate bilateral hip osteoarthritic changes. No acute osseous pathology. IMPRESSION: 1. Scattered airspace opacities most consistent with multifocal pneumonia. Clinical correlation and follow-up recommended. 2. Advanced fatty infiltration of the liver with probable early changes of cirrhosis. 3. Inflammatory changes of the upper abdomen may be related to underlying liver disease or acute pancreatitis. Correlation with pancreatic enzymes recommended. 4. Hepatic colopathy versus pancolitis. Clinical correlation is recommended. No bowel obstruction. Normal appendix. 5. Aortic Atherosclerosis (ICD10-I70.0). Electronically  Signed   By: Elgie Collard M.D.   On: 06/14/2019 23:42   Dg Chest 1 View  Result Date: 2019-06-14 CLINICAL DATA:  Post intubation EXAM: CHEST  1 VIEW COMPARISON:  11/14/2017 FINDINGS: Endotracheal tube tip is about 4.7 cm superior to the carina. No consolidation or effusion. No pneumothorax. Normal cardiomediastinal silhouette. IMPRESSION: 1. Endotracheal tube tip about 4.7 cm superior to carina 2. Clear lung fields Electronically Signed   By: Jasmine Pang M.D.   On: 2019-06-14 23:04   Ct Head Wo Contrast  Result Date: 06-14-2019 CLINICAL DATA:  Altered level of consciousness EXAM: CT HEAD WITHOUT CONTRAST TECHNIQUE: Contiguous axial images were obtained from the base of the skull through the vertex without intravenous contrast. COMPARISON:  None. FINDINGS: Brain: No acute intracranial abnormality. Specifically, no hemorrhage, hydrocephalus, mass lesion, acute infarction, or significant intracranial injury. Vascular: No hyperdense vessel or unexpected calcification. Skull: No acute calvarial abnormality. Sinuses/Orbits: No acute finding Other: None IMPRESSION: No acute intracranial abnormality. Electronically Signed   By: Charlett Nose M.D.   On: 14-Jun-2019 23:26   Ct Chest Wo Contrast  Result Date: 2019-06-14 CLINICAL DATA:  58 year old male with unresponsiveness. EXAM: CT CHEST, ABDOMEN AND PELVIS WITHOUT CONTRAST TECHNIQUE: Multidetector CT imaging of the chest, abdomen and pelvis was performed following the standard protocol without IV contrast. COMPARISON:  Abdominal ultrasound dated 03/31/2010 and CT dated 03/31/2010 FINDINGS: Evaluation of this exam is limited in the absence of intravenous contrast. CT CHEST FINDINGS Cardiovascular: There is no cardiomegaly or pericardial effusion. Advanced multi vessel coronary vascular calcification noted. There is mild atherosclerotic calcification of the aorta. No aneurysmal dilatation. The central pulmonary arteries are grossly unremarkable.  Mediastinum/Nodes: There is no hilar or mediastinal adenopathy. The esophagus is grossly unremarkable. No mediastinal fluid collection. Lungs/Pleura: There are bibasilar subsegmental atelectasis. Scattered upper lobe predominant hazy airspace densities with peripheral and subpleural distribution most consistent with multifocal pneumonia, possibly atypical etiology. Clinical correlation is recommended. A 2.6 x 1.8 cm focal consolidative  area in the right upper lobe likely infectious. Follow-up to resolution recommended to exclude neoplasm. No significant pleural effusion. There is no pneumothorax. The central airways are patent. An endotracheal tube is noted with tip approximately 3.3 cm above the carina. Musculoskeletal: There is degenerative changes of the spine. Degenerative changes of the spine. No acute osseous pathology. CT ABDOMEN PELVIS FINDINGS There is no intra-abdominal free air. Trace free fluid within pelvis. Hepatobiliary: Advanced fatty infiltration of the liver. Slight irregularity of the liver contour may represent early changes of cirrhosis. No intrahepatic biliary ductal dilatation. High attenuating content within the gallbladder most consistent with sludge or hyper concentrated bile. No pericholecystic fluid. Pancreas: There is peripancreatic stranding and inflammatory changes. These findings may be related to underlying liver disease or represent acute pancreatitis. Correlation with pancreatic enzymes recommended. No drainable fluid collection/abscess or pseudocyst. Spleen: Normal in size without focal abnormality. Adrenals/Urinary Tract: The adrenal glands are unremarkable. There is no hydronephrosis or nephrolithiasis on either side. The visualized ureters appear unremarkable. The urinary bladder is decompressed around a Foley catheter. Stomach/Bowel: There is diffuse thickening of the colon. Although this may be related to underlying liver disease findings concerning for pancolitis. Clinical  correlation is recommended. There is no bowel obstruction. The appendix is normal. Vascular/Lymphatic: Moderate aortoiliac atherosclerotic disease. A right femoral venous line with tip in the right external iliac vein is noted. No portal venous gas. There is no adenopathy. Reproductive: The prostate and seminal vesicles are grossly unremarkable. No pelvic mass. Other: Mild diffuse subcutaneous edema. Musculoskeletal: Osteopenia. Moderate bilateral hip osteoarthritic changes. No acute osseous pathology. IMPRESSION: 1. Scattered airspace opacities most consistent with multifocal pneumonia. Clinical correlation and follow-up recommended. 2. Advanced fatty infiltration of the liver with probable early changes of cirrhosis. 3. Inflammatory changes of the upper abdomen may be related to underlying liver disease or acute pancreatitis. Correlation with pancreatic enzymes recommended. 4. Hepatic colopathy versus pancolitis. Clinical correlation is recommended. No bowel obstruction. Normal appendix. 5. Aortic Atherosclerosis (ICD10-I70.0). Electronically Signed   By: Elgie CollardArash  Radparvar M.D.   On: 2019-10-08 23:42   Dg Chest Port 1 View  Result Date: 06/03/2019 CLINICAL DATA:  CHF.  Hypertension. EXAM: PORTABLE CHEST 1 VIEW COMPARISON:  2019-10-08 plain film and chest CT. FINDINGS: Endotracheal tube terminates 7.0 Cm above carina. Numerous leads and wires project over the chest. Normal heart size. No pleural effusion or pneumothorax. The peripheral opacities on CT are not readily apparent. Nonspecific interstitial thickening is not significantly changed and likely related to COPD/chronic bronchitis. IMPRESSION: Multifocal pulmonary opacities on yesterday's CT are not readily plain film apparent. No superimposed lobar consolidation. Electronically Signed   By: Jeronimo GreavesKyle  Talbot M.D.   On: 05/29/2019 05:03     ASSESSMENT AND PLAN:  58 year old male with history of alcohol abuse who was found down and unresponsive presented to  the emergency room with multiorgan failure.  1.  Acute hypoxic/hypercarbic severe respiratory failure with septic shock and multifocal pneumonia: Continue vent management as per intensivist. Patient on several pressors On hypothermia protocol 2.  Septic shock due to pneumonia: On Rocephin and doxycycline  3  Acute kidney injury in the setting of ATN from septic shock: Dialysis to be initiated 4.  Severe coagulopathy secondary to liver failure 5.  Metabolic encephalopathy in the setting of all above issues and polysubstance abuse continue on thiamine  Normalities: As per pharmacy Patient critically ill with multiorgan failure.  Would consider palliative care consultation.   CODE STATUS: Full TOTAL TIME TAKING CARE  OF THIS PATIENT: 31 minutes.     POSSIBLE D/C ???, DEPENDING ON CLINICAL CONDITION.   Bettey Costa M.D on 05/28/2019 at 11:01 AM  Between 7am to 6pm - Pager - [phone] After 6pm go to [email or web address] - password [email or web address] [facility]  Office  [phone]  CC: Primary care physician; Remi Haggard, FNP  Note: This dictation was prepared with Dragon dictation along with smaller phrase technology. Any transcriptional errors that result from this process are unintentional.

## 2019-06-14 NOTE — Death Summary Note (Signed)
DEATH SUMMARY   Patient Details  Name: Marcus Foster MRN: [id number] DOB: Dec 03, 1960  Admission/Discharge Information   Admit Date:  06/14/19  Date of Death: Date of Death: (P) Jun 16, 2019  Time of Death: Time of Death: (P) 1944  Length of Stay: 1  Referring Physician: Remi Haggard, FNP   Reason(s) for Hospitalization  Multiorgan failure  Diagnoses  Preliminary cause of death: cardiopulmonary arrest Secondary Diagnoses (including complications and co-morbidities):  Active Problems:   Acute respiratory failure (HCC)   Acute liver failure with hepatic coma (HCC)   Acute renal failure [facility])   Brief Hospital Course (including significant findings, care, treatment, and services provided and events leading to death)  Marcus Foster is a 58 y.o. year old male with a history of dilated cardiomyopathy, alcohol abuse, polysubstance abuse, systolic heart failure, hypertension, COPD/asthma, and recently new onset dysphagia, and esophageal candidiasis who was brought to the ED for evaluation after being found unresponsive by family.  When EMS arrived patient was hypoglycemic.  Upon arrival in the ED, he was emergently intubated.  His ED work-up was significant for multifocal pneumonia , liver cirrhosis, acute pancreatitis and possible pancolitis on CT.  His ABG postintubation showed a pH of 7.1, PCO2 of 27, PO2 of 208, bicarb of 9.6 and SPO2 99% on 45% FiO2.  His chemistry showed a blood glucose level of less than 20, creatinine of 4.95, elevated LFTs and lactic acid level of greater than 11.  His WBC was 12.4, hemoglobin 8.0, hematocrit 24.5 and platelets of 56.  His INR was 6.5.  Right femoral triple-lumen catheter was placed and he was transfused 2 units of fresh frozen plasma.  Attempts were made to transfer patient to a higher level of care where he can receive specialized hepatologists care for his alcoholic hepatitis and multiorgan failure.  Unfortunately all higher level hospitals declined  given patient's poor prognosis.  He was admitted to the ICU for further management.  Was noted to be bleeding from his mouth, rectum and IV sites. Additional blood products were given and a left femoral HD catheter was placed on 01/10/2019 and patient was started on CRRT. He was maintained on multiple pressors, maximal vent support, broad spectrum antibiotics and bicarb infusion.  Despite interventions, patient remained severely acidotic and in refractory shock with worsening multiorgan failure. After discussion with family and patient's daughter, he was made a DNR on June 16, 2019 at [facility] and he expired on the vent at 72. Support provided to the family  Pertinent Labs and Studies  Significant Diagnostic Studies Ct Abdomen Pelvis Wo Contrast  Result Date: 06/14/2019 CLINICAL DATA:  58 year old male with unresponsiveness. EXAM: CT CHEST, ABDOMEN AND PELVIS WITHOUT CONTRAST TECHNIQUE: Multidetector CT imaging of the chest, abdomen and pelvis was performed following the standard protocol without IV contrast. COMPARISON:  Abdominal ultrasound dated 03/31/2010 and CT dated 03/31/2010 FINDINGS: Evaluation of this exam is limited in the absence of intravenous contrast. CT CHEST FINDINGS Cardiovascular: There is no cardiomegaly or pericardial effusion. Advanced multi vessel coronary vascular calcification noted. There is mild atherosclerotic calcification of the aorta. No aneurysmal dilatation. The central pulmonary arteries are grossly unremarkable. Mediastinum/Nodes: There is no hilar or mediastinal adenopathy. The esophagus is grossly unremarkable. No mediastinal fluid collection. Lungs/Pleura: There are bibasilar subsegmental atelectasis. Scattered upper lobe predominant hazy airspace densities with peripheral and subpleural distribution most consistent with multifocal pneumonia, possibly atypical etiology. Clinical correlation is recommended. A 2.6 x 1.8 cm focal consolidative area in the right upper lobe  likely  infectious. Follow-up to resolution recommended to exclude neoplasm. No significant pleural effusion. There is no pneumothorax. The central airways are patent. An endotracheal tube is noted with tip approximately 3.3 cm above the carina. Musculoskeletal: There is degenerative changes of the spine. Degenerative changes of the spine. No acute osseous pathology. CT ABDOMEN PELVIS FINDINGS There is no intra-abdominal free air. Trace free fluid within pelvis. Hepatobiliary: Advanced fatty infiltration of the liver. Slight irregularity of the liver contour may represent early changes of cirrhosis. No intrahepatic biliary ductal dilatation. High attenuating content within the gallbladder most consistent with sludge or hyper concentrated bile. No pericholecystic fluid. Pancreas: There is peripancreatic stranding and inflammatory changes. These findings may be related to underlying liver disease or represent acute pancreatitis. Correlation with pancreatic enzymes recommended. No drainable fluid collection/abscess or pseudocyst. Spleen: Normal in size without focal abnormality. Adrenals/Urinary Tract: The adrenal glands are unremarkable. There is no hydronephrosis or nephrolithiasis on either side. The visualized ureters appear unremarkable. The urinary bladder is decompressed around a Foley catheter. Stomach/Bowel: There is diffuse thickening of the colon. Although this may be related to underlying liver disease findings concerning for pancolitis. Clinical correlation is recommended. There is no bowel obstruction. The appendix is normal. Vascular/Lymphatic: Moderate aortoiliac atherosclerotic disease. A right femoral venous line with tip in the right external iliac vein is noted. No portal venous gas. There is no adenopathy. Reproductive: The prostate and seminal vesicles are grossly unremarkable. No pelvic mass. Other: Mild diffuse subcutaneous edema. Musculoskeletal: Osteopenia. Moderate bilateral hip osteoarthritic  changes. No acute osseous pathology. IMPRESSION: 1. Scattered airspace opacities most consistent with multifocal pneumonia. Clinical correlation and follow-up recommended. 2. Advanced fatty infiltration of the liver with probable early changes of cirrhosis. 3. Inflammatory changes of the upper abdomen may be related to underlying liver disease or acute pancreatitis. Correlation with pancreatic enzymes recommended. 4. Hepatic colopathy versus pancolitis. Clinical correlation is recommended. No bowel obstruction. Normal appendix. 5. Aortic Atherosclerosis (ICD10-I70.0). Electronically Signed   By: Elgie Collard M.D.   On: Jun 15, 2019 23:42   Dg Chest 1 View  Result Date: June 15, 2019 CLINICAL DATA:  Post intubation EXAM: CHEST  1 VIEW COMPARISON:  11/14/2017 FINDINGS: Endotracheal tube tip is about 4.7 cm superior to the carina. No consolidation or effusion. No pneumothorax. Normal cardiomediastinal silhouette. IMPRESSION: 1. Endotracheal tube tip about 4.7 cm superior to carina 2. Clear lung fields Electronically Signed   By: Jasmine Pang M.D.   On: 06/15/2019 23:04   Ct Head Wo Contrast  Result Date: 2019/06/15 CLINICAL DATA:  Altered level of consciousness EXAM: CT HEAD WITHOUT CONTRAST TECHNIQUE: Contiguous axial images were obtained from the base of the skull through the vertex without intravenous contrast. COMPARISON:  None. FINDINGS: Brain: No acute intracranial abnormality. Specifically, no hemorrhage, hydrocephalus, mass lesion, acute infarction, or significant intracranial injury. Vascular: No hyperdense vessel or unexpected calcification. Skull: No acute calvarial abnormality. Sinuses/Orbits: No acute finding Other: None IMPRESSION: No acute intracranial abnormality. Electronically Signed   By: Charlett Nose M.D.   On: 06-15-19 23:26   Ct Chest Wo Contrast  Result Date: 06/15/19 CLINICAL DATA:  58 year old male with unresponsiveness. EXAM: CT CHEST, ABDOMEN AND PELVIS WITHOUT CONTRAST  TECHNIQUE: Multidetector CT imaging of the chest, abdomen and pelvis was performed following the standard protocol without IV contrast. COMPARISON:  Abdominal ultrasound dated 03/31/2010 and CT dated 03/31/2010 FINDINGS: Evaluation of this exam is limited in the absence of intravenous contrast. CT CHEST FINDINGS Cardiovascular: There is no cardiomegaly or pericardial  effusion. Advanced multi vessel coronary vascular calcification noted. There is mild atherosclerotic calcification of the aorta. No aneurysmal dilatation. The central pulmonary arteries are grossly unremarkable. Mediastinum/Nodes: There is no hilar or mediastinal adenopathy. The esophagus is grossly unremarkable. No mediastinal fluid collection. Lungs/Pleura: There are bibasilar subsegmental atelectasis. Scattered upper lobe predominant hazy airspace densities with peripheral and subpleural distribution most consistent with multifocal pneumonia, possibly atypical etiology. Clinical correlation is recommended. A 2.6 x 1.8 cm focal consolidative area in the right upper lobe likely infectious. Follow-up to resolution recommended to exclude neoplasm. No significant pleural effusion. There is no pneumothorax. The central airways are patent. An endotracheal tube is noted with tip approximately 3.3 cm above the carina. Musculoskeletal: There is degenerative changes of the spine. Degenerative changes of the spine. No acute osseous pathology. CT ABDOMEN PELVIS FINDINGS There is no intra-abdominal free air. Trace free fluid within pelvis. Hepatobiliary: Advanced fatty infiltration of the liver. Slight irregularity of the liver contour may represent early changes of cirrhosis. No intrahepatic biliary ductal dilatation. High attenuating content within the gallbladder most consistent with sludge or hyper concentrated bile. No pericholecystic fluid. Pancreas: There is peripancreatic stranding and inflammatory changes. These findings may be related to underlying liver  disease or represent acute pancreatitis. Correlation with pancreatic enzymes recommended. No drainable fluid collection/abscess or pseudocyst. Spleen: Normal in size without focal abnormality. Adrenals/Urinary Tract: The adrenal glands are unremarkable. There is no hydronephrosis or nephrolithiasis on either side. The visualized ureters appear unremarkable. The urinary bladder is decompressed around a Foley catheter. Stomach/Bowel: There is diffuse thickening of the colon. Although this may be related to underlying liver disease findings concerning for pancolitis. Clinical correlation is recommended. There is no bowel obstruction. The appendix is normal. Vascular/Lymphatic: Moderate aortoiliac atherosclerotic disease. A right femoral venous line with tip in the right external iliac vein is noted. No portal venous gas. There is no adenopathy. Reproductive: The prostate and seminal vesicles are grossly unremarkable. No pelvic mass. Other: Mild diffuse subcutaneous edema. Musculoskeletal: Osteopenia. Moderate bilateral hip osteoarthritic changes. No acute osseous pathology. IMPRESSION: 1. Scattered airspace opacities most consistent with multifocal pneumonia. Clinical correlation and follow-up recommended. 2. Advanced fatty infiltration of the liver with probable early changes of cirrhosis. 3. Inflammatory changes of the upper abdomen may be related to underlying liver disease or acute pancreatitis. Correlation with pancreatic enzymes recommended. 4. Hepatic colopathy versus pancolitis. Clinical correlation is recommended. No bowel obstruction. Normal appendix. 5. Aortic Atherosclerosis (ICD10-I70.0). Electronically Signed   By: Elgie CollardArash  Radparvar M.D.   On: 08-07-2019 23:42   Dg Chest Port 1 View  Result Date: 05/18/2019 CLINICAL DATA:  CHF.  Hypertension. EXAM: PORTABLE CHEST 1 VIEW COMPARISON:  08-07-2019 plain film and chest CT. FINDINGS: Endotracheal tube terminates 7.0 Cm above carina. Numerous leads and wires  project over the chest. Normal heart size. No pleural effusion or pneumothorax. The peripheral opacities on CT are not readily apparent. Nonspecific interstitial thickening is not significantly changed and likely related to COPD/chronic bronchitis. IMPRESSION: Multifocal pulmonary opacities on yesterday's CT are not readily plain film apparent. No superimposed lobar consolidation. Electronically Signed   By: Jeronimo GreavesKyle  Talbot M.D.   On: 05/25/2019 05:03    Microbiology Recent Results (from the past 240 hour(s))  SARS Coronavirus 2 [facility](Hospital order, Performed in [facility] hospital lab) Nasopharyngeal Nasopharyngeal Swab     Status: None   Collection Time: 03/06/19  9:38 PM   Specimen: Nasopharyngeal Swab  Result Value Ref Range Status   SARS Coronavirus 2  NEGATIVE NEGATIVE Final    Comment: (NOTE) If result is NEGATIVE SARS-CoV-2 target nucleic acids are NOT DETECTED. The SARS-CoV-2 RNA is generally detectable in upper and lower  respiratory specimens during the acute phase of infection. The lowest  concentration of SARS-CoV-2 viral copies this assay can detect is 250  copies / mL. A negative result does not preclude SARS-CoV-2 infection  and should not be used as the sole basis for treatment or other  patient management decisions.  A negative result may occur with  improper specimen collection / handling, submission of specimen other  than nasopharyngeal swab, presence of viral mutation(s) within the  areas targeted by this assay, and inadequate number of viral copies  (<250 copies / mL). A negative result must be combined with clinical  observations, patient history, and epidemiological information. If result is POSITIVE SARS-CoV-2 target nucleic acids are DETECTED. The SARS-CoV-2 RNA is generally detectable in upper and lower  respiratory specimens dur ing the acute phase of infection.  Positive  results are indicative of active infection with SARS-CoV-2.  Clinical  correlation with  patient history and other diagnostic information is  necessary to determine patient infection status.  Positive results do  not rule out bacterial infection or co-infection with other viruses. If result is PRESUMPTIVE POSTIVE SARS-CoV-2 nucleic acids MAY BE PRESENT.   A presumptive positive result was obtained on the submitted specimen  and confirmed on repeat testing.  While 2019 novel coronavirus  (SARS-CoV-2) nucleic acids may be present in the submitted sample  additional confirmatory testing may be necessary for epidemiological  and / or clinical management purposes  to differentiate between  SARS-CoV-2 and other Sarbecovirus currently known to infect humans.  If clinically indicated additional testing with an alternate test  methodology [phone]([id number]) is advised. The SARS-CoV-2 RNA is generally  detectable in upper and lower respiratory sp ecimens during the acute  phase of infection. The expected result is Negative. Fact Sheet for Patients:  BoilerBrush.com.cyhttps://www.fda.gov/media/136312/download Fact Sheet for Healthcare Providers: https://pope.com/https://www.fda.gov/media/136313/download This test is not yet approved or cleared by the [place] FDA and has been authorized for detection and/or diagnosis of SARS-CoV-2 by FDA under an Emergency Use Authorization (EUA).  This EUA will remain in effect (meaning this test can be used) for the duration of the COVID-19 declaration under Section 564(b)(1) of the Act, 21 U.S.C. section 360bbb-3(b)(1), unless the authorization is terminated or revoked sooner. Performed at [facility], [address] [place], [place] [address]   Blood culture (routine x 2)     Status: None (Preliminary result)   Collection Time: 2019-01-08 11:31 PM   Specimen: BLOOD  Result Value Ref Range Status   Specimen Description BLOOD LEFT EXTERNAL JUGULAR  Final   Special Requests   Final    BOTTLES DRAWN AEROBIC AND ANAEROBIC Blood Culture results may not be optimal due to an  excessive volume of blood received in culture bottles   Culture   Final    NO GROWTH 2 DAYS Performed at [facility], [address] [place], [place] [address]    Report Status PENDING  Incomplete  Blood culture (routine x 2)     Status: None (Preliminary result)   Collection Time: 2019-01-08 11:31 PM   Specimen: BLOOD  Result Value Ref Range Status   Specimen Description BLOOD RIGHT FEMORAL ARTERY  Final   Special Requests   Final    BOTTLES DRAWN AEROBIC AND ANAEROBIC Blood Culture results may not be optimal due to  an excessive volume of blood received in culture bottles   Culture   Final    NO GROWTH 1 DAY Performed at [facility], [address] [place], [place] [address]    Report Status PENDING  Incomplete  MRSA PCR Screening     Status: None   Collection Time: 06/11/19  5:13 AM   Specimen: Nasopharyngeal  Result Value Ref Range Status   MRSA by PCR NEGATIVE NEGATIVE Final    Comment:        The GeneXpert MRSA Assay (FDA approved for NASAL specimens only), is one component of a comprehensive MRSA colonization surveillance program. It is not intended to diagnose MRSA infection nor to guide or monitor treatment for MRSA infections. Performed at [facility], [address] [place], [place] [address]   Culture, respiratory (non-expectorated)     Status: None (Preliminary result)   Collection Time: 06/11/19 10:47 AM   Specimen: SPU; Respiratory  Result Value Ref Range Status   Specimen Description   Final    SPUTUM Performed at [facility], [address], [place], [place] [address]    Special Requests   Final    NONE Performed at [facility], [address] [place], [place] [address]    Gram Stain   Final    ABUNDANT WBC PRESENT, PREDOMINANTLY PMN ABUNDANT GRAM POSITIVE COCCI IN PAIRS ABUNDANT GRAM NEGATIVE RODS    Culture   Final    TOO YOUNG TO READ Performed at [facility], [address]  [place], [place] [address]    Report Status PENDING  Incomplete    Lab Basic Metabolic Panel: Recent Labs  Lab 06/11/19 1550 06/11/19 2013 06/11/19 2343 07-09-19 0532 July 09, 2019 1145 2019/07/09 1714  NA 139 135 141 141 139 136  K 3.2* 3.7 4.7 4.8 4.8 6.7*  CL 94* 89* 93* 96* 98 98  CO2 17* 16* 13* 14* 14* 11*  GLUCOSE 86 91 46* 149* 109* 62*  BUN 28* 29* 30* 25* 19 15  CREATININE 4.73* 4.86* 5.22* 4.04* 3.04* 2.63*  CALCIUM 6.9* 6.7* 6.5* 6.4* 6.5* 6.1*  MG 1.4*  --  2.8* 2.4  2.4 2.2 2.1  PHOS 5.0* 6.2* 7.7* 7.1*  7.1* 6.6* 8.3*   Liver Function Tests: Recent Labs  Lab 06/04/2019 2138 06/11/19 0513 06/11/19 1550 06/11/19 2013 09-Jul-2019 0532 09-Jul-2019 1145 2019/07/09 1714  AST 214* 289*  --   --   --   --   --   ALT 74* 86*  --   --   --   --   --   ALKPHOS 140* 175*  --   --   --   --   --   BILITOT 6.9* 7.3*  --   --   --   --   --   PROT 6.5 6.8  --   --   --   --   --   ALBUMIN 1.8* 1.8* 1.8* 1.7* 1.4* 1.6* 1.5*   No results for input(s): LIPASE, AMYLASE in the last 168 hours. Recent Labs  Lab 06/11/2019 2331  AMMONIA 157*   CBC: Recent Labs  Lab 05/14/2019 2138 06/11/19 0513 07-09-2019 0532  WBC 12.4* 13.1* 16.2*  NEUTROABS 11.0* 11.8*  --   HGB 8.0* 10.7* 9.0*  HCT 24.5* 33.5* 28.1*  MCV 96.8 97.7 99.6  PLT 56* 67* 41*   Cardiac Enzymes: No results for input(s): CKTOTAL, CKMB, CKMBINDEX, TROPONINI in the last 168 hours. Sepsis Labs:  Recent Labs  Lab 06/11/2019 2138 05/21/2019 2331 06/11/19 0513 06/11/19 0644 06/11/2019 0532 06/11/2019 1714  PROCALCITON  --   --  2.92  --  3.80  --   WBC 12.4*  --  13.1*  --  16.2*  --   LATICACIDVEN >11.0* >11.0*  --  >11.0* >11.0* >11.0*    Procedures/Operations  HD catheter-left femoral Intubation Right TLC   [name] 03/04/19, 8:53 PM

## 2019-06-14 NOTE — Procedures (Signed)
Hemodialysis catheter Insertion Procedure Note Marcus Foster [id number] 07/16/1961  Procedure: Insertion of hemodialysis catheter Indications: Hemodialysis  Procedure Details Consent: Risks of procedure as well as the alternatives and risks of each were explained to the (patient/caregiver).  Consent for procedure obtained. and Documented.  Consent obtained from patient's daughter Time Out: Verified patient identification, verified procedure, site/side was marked, verified correct patient position, special equipment/implants available, medications/allergies/relevent history reviewed, required imaging and test results available.  Performed  Maximum sterile technique was used including antiseptics, cap, gloves, gown, hand hygiene, mask and sheet. Skin prep: Chlorhexidine; local anesthetic administered A antimicrobial bonded/coated triple lumen catheter was placed in the left femoral vein due to patient being a dialysis patient using the Seldinger technique.  Evaluation Blood flow good Complications: No apparent complications Patient did tolerate procedure well. Chest X-ray ordered to verify placement.  CXR: Not indicated for femoral location.  Procedure performed under direct supervision of Dr. Lanney Gins. Ultrasound utilized for realtime vessel cannulation  Marcus Foster 06/04/2019, 8:42 AM

## 2019-06-14 NOTE — Progress Notes (Signed)
Pharmacy Electrolyte Monitoring Consult:  Pharmacy consulted to assist in monitoring and replacing electrolytes in this 58 y.o. male admitted on 06/11/2019 with Hypoglycemia and Loss of Consciousness  Patient currently requiring CRRT and mechanical ventilation.   Labs:  Sodium (mmol/L)  Date Value  07/12/19 141  08/09/2018 141   Potassium (mmol/L)  Date Value  07/12/2019 4.8   Magnesium (mg/dL)  Date Value  07/12/2019 2.4  2019/07/12 2.4   Phosphorus (mg/dL)  Date Value  07-12-19 7.1 (H)  Jul 12, 2019 7.1 (H)   Calcium (mg/dL)  Date Value  07/12/2019 6.4 (LL)   Albumin (g/dL)  Date Value  07/12/19 1.4 (L)  08/09/2018 4.6   Corrected Calcium: 8.7  Assessment/Plan: Patient receiving Sodium Bicarb infusion at 168mL/hr. Patient receiving 2K CRRT bath.   K WNL, Mag WNL Phos elevated, no replacement needed at this time, will continue to monitor.  Potassium replacement should only be done after consulting with nephrologist.   Labs ordered Q6hr per CRRT protocol.   Will replace to maintain potassium ~ 4 and goal magnesium ~ 2.   Pharmacy will continue to monitor and adjust per consult.   Tobie Lords, PharmD, BCPS Clinical Pharmacist 07/12/19 6:54 AM

## 2019-06-14 NOTE — Progress Notes (Signed)
   06/08/2019 1858  Clinical Encounter Type  Visited With Patient and family together  Visit Type Initial  Referral From Nurse  Consult/Referral To Chaplain  Spiritual Encounters  Spiritual Needs Prayer;Emotional;Grief support  [facility] received page at 1858 to visit patient and family as patient was beginning to transition. Entered room and patient's daughter and other members of support system were present. Family was tearful. [place] provided pastoral care through the ministry of presence. Recited Psalms 65 and prayed with patient upon request. Patient died at 29. Anticipated needs of family. Pastoral visit was appreciated.

## 2019-06-14 NOTE — Progress Notes (Signed)
Marcus Foster  MRN: [id number]  DOB/AGE: October 14, 1960 58 y.o.  Primary Care Physician:Lindley, Jordan Likes, FNP  Admit date: 05/28/2019  Chief Complaint:  Chief Complaint  Patient presents with  . Hypoglycemia  . Loss of Consciousness    S-Pt presented on  05/30/2019 with  Chief Complaint  Patient presents with  . Hypoglycemia  . Loss of Consciousness  .    Pt is sedated, intubated unable to offer any complaints.    Pt daughter was present in the room, she mentioned " we are waiting for his mother to come from [place] before me make decision"    I discussed pt poor prognosis and kidney related issues to the best of my ability.     Meds . chlorhexidine gluconate (MEDLINE KIT)  15 mL Mouth Rinse BID  . Chlorhexidine Gluconate Cloth  6 each Topical Daily  . folic acid  1 mg Oral Daily  . insulin aspart  0-9 Units Subcutaneous Q4H  . ipratropium-albuterol  3 mL Nebulization Q6H  . mouth rinse  15 mL Mouth Rinse 10 times per day  . multivitamin with minerals  1 tablet Oral Daily  . pantoprazole (PROTONIX) IV  40 mg Intravenous Daily  . sodium chloride flush  3 mL Intravenous Q12H  . thiamine  100 mg Oral Daily   Or  . thiamine  100 mg Intravenous Daily      Physical Exam: Vital signs in last 24 hours: Temp:  [94.8 F (34.9 C)-100.6 F (38.1 C)] 98.2 F (36.8 C) (08/30 0650) Pulse Rate:  [95-143] 95 (08/29 2240) Resp:  [12-37] 26 (08/30 0650) BP: (72-116)/(37-72) 79/55 (08/30 0650) SpO2:  [90 %-99 %] 95 % (08/30 0718) FiO2 (%):  [30 %-50 %] 50 % (08/30 0718) Weight:  [84.8 kg] 84.8 kg (08/30 0456) Weight change: -0.2 kg    Intake/Output from previous day: 08/29 0701 - 08/30 0700 In: 5697.5 [I.V.:5095.5; IV Piggyback:602] Out: 0  No intake/output data recorded.   Physical Exam: General- pt is sedated , unable to respond HEENT- Head is atraumatic , normocephalic, ET tube in situ  Resp- No acute REsp distress, Rhonchi+ CVS- S1S2 regular in rate and rhythm, on  tachycardiac side GIT- BS+ but decreased, soft, NT, ND EXT- NO LE Edema, NO Cyanosis     Lab Results: CBC Recent Labs    06/11/19 0513 2019-07-04 0532  WBC 13.1* 16.2*  HGB 10.7* 9.0*  HCT 33.5* 28.1*  PLT 67* 41*    BMET Recent Labs    06/11/19 2343 07-04-19 0532  NA 141 141  K 4.7 4.8  CL 93* 96*  CO2 13* 14*  GLUCOSE 46* 149*  BUN 30* 25*  CREATININE 5.22* 4.04*  CALCIUM 6.5* 6.4*    Anion gap 141-110 AG 31  Alb 1.4  Delta AG  31--5=26 Delta Bicarb 24-14=10   Creat trend               On CRRT 2020 4.8=>4.95==>5.2 ==>   4.0 2019  1.2--1.6 2018   1.1--1.2    MICRO Recent Results (from the past 240 hour(s))  SARS Coronavirus 2 [facility] order, Performed in [facility] hospital lab) Nasopharyngeal Nasopharyngeal Swab     Status: None   Collection Time: 05/22/2019  9:38 PM   Specimen: Nasopharyngeal Swab  Result Value Ref Range Status   SARS Coronavirus 2 NEGATIVE NEGATIVE Final    Comment: (NOTE) If result is NEGATIVE SARS-CoV-2 target nucleic acids are NOT DETECTED. The SARS-CoV-2 RNA is generally  detectable in upper and lower  respiratory specimens during the acute phase of infection. The lowest  concentration of SARS-CoV-2 viral copies this assay can detect is 250  copies / mL. A negative result does not preclude SARS-CoV-2 infection  and should not be used as the sole basis for treatment or other  patient management decisions.  A negative result may occur with  improper specimen collection / handling, submission of specimen other  than nasopharyngeal swab, presence of viral mutation(s) within the  areas targeted by this assay, and inadequate number of viral copies  (<250 copies / mL). A negative result must be combined with clinical  observations, patient history, and epidemiological information. If result is POSITIVE SARS-CoV-2 target nucleic acids are DETECTED. The SARS-CoV-2 RNA is generally detectable in upper and lower  respiratory  specimens dur ing the acute phase of infection.  Positive  results are indicative of active infection with SARS-CoV-2.  Clinical  correlation with patient history and other diagnostic information is  necessary to determine patient infection status.  Positive results do  not rule out bacterial infection or co-infection with other viruses. If result is PRESUMPTIVE POSTIVE SARS-CoV-2 nucleic acids MAY BE PRESENT.   A presumptive positive result was obtained on the submitted specimen  and confirmed on repeat testing.  While 2019 novel coronavirus  (SARS-CoV-2) nucleic acids may be present in the submitted sample  additional confirmatory testing may be necessary for epidemiological  and / or clinical management purposes  to differentiate between  SARS-CoV-2 and other Sarbecovirus currently known to infect humans.  If clinically indicated additional testing with an alternate test  methodology [phone]) is advised. The SARS-CoV-2 RNA is generally  detectable in upper and lower respiratory sp ecimens during the acute  phase of infection. The expected result is Negative. Fact Sheet for Patients:  StrictlyIdeas.no Fact Sheet for Healthcare Providers: BankingDealers.co.za This test is not yet approved or cleared by the [place] FDA and has been authorized for detection and/or diagnosis of SARS-CoV-2 by FDA under an Emergency Use Authorization (EUA).  This EUA will remain in effect (meaning this test can be used) for the duration of the COVID-19 declaration under Section 564(b)(1) of the Act, 21 U.S.C. section 360bbb-3(b)(1), unless the authorization is terminated or revoked sooner. Performed at [facility], [address] [place], [place] [address]   Blood culture (routine x 2)     Status: None (Preliminary result)   Collection Time: 05/29/2019 11:31 PM   Specimen: BLOOD  Result Value Ref Range Status   Specimen Description BLOOD  LEFT EXTERNAL JUGULAR  Final   Special Requests   Final    BOTTLES DRAWN AEROBIC AND ANAEROBIC Blood Culture results may not be optimal due to an excessive volume of blood received in culture bottles   Culture   Final    NO GROWTH 2 DAYS Performed at [facility], [address] [place], [place] [address]    Report Status PENDING  Incomplete  Blood culture (routine x 2)     Status: None (Preliminary result)   Collection Time: 05/28/2019 11:31 PM   Specimen: BLOOD  Result Value Ref Range Status   Specimen Description BLOOD RIGHT FEMORAL ARTERY  Final   Special Requests   Final    BOTTLES DRAWN AEROBIC AND ANAEROBIC Blood Culture results may not be optimal due to an excessive volume of blood received in culture bottles   Culture   Final    NO GROWTH 1 DAY Performed at  [facility], [address] [place], [place] [address]    Report Status PENDING  Incomplete  MRSA PCR Screening     Status: None   Collection Time: 06/11/19  5:13 AM   Specimen: Nasopharyngeal  Result Value Ref Range Status   MRSA by PCR NEGATIVE NEGATIVE Final    Comment:        The GeneXpert MRSA Assay (FDA approved for NASAL specimens only), is one component of a comprehensive MRSA colonization surveillance program. It is not intended to diagnose MRSA infection nor to guide or monitor treatment for MRSA infections. Performed at [facility], [address] [place], [place] [address]       Lab Results  Component Value Date   CALCIUM 6.4 (LL) Jun 13, 2019   PHOS 7.1 (H) 06-13-19   PHOS 7.1 (H) 2019-06-13    Phos  7.7==>7.1        Impression:  1)Renal  AKI secondary to ATN               ATN sec to  shock               Oliguric ATN               AKI on CKD               CKD stage 3.               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 Sec to ATN  5)Resp-admitted with acute resp failiure  Pt is currently intubated  Critical care  MD is following  6)Electrolytes  Normokalemic NOrmonatremic   7)Acid base Co2 not at goal HIGH Anion gap acidosis sec to lactic acidosis Metabolic alkalosis sec to IV Bicarb  8) ID- admitted with septic shock        Pneumonia        On IV ABX  9) CVS- [place] high          Primary team following   10) Liver- pt admitted liver failure         Ammonia high     Plan:  Will continue CRRT  I discussed at length about his issues with pt family.    Hendricks S June 13, 2019, 7:55 AM

## 2019-06-14 NOTE — Progress Notes (Signed)
Pharmacy Electrolyte Monitoring Consult:  Pharmacy consulted to assist in monitoring and replacing electrolytes in this 58 y.o. male admitted on 07/06/19 with Hypoglycemia and Loss of Consciousness  Patient currently requiring CRRT and mechanical ventilation.   Labs:  Sodium (mmol/L)  Date Value  06/01/2019 136  08/09/2018 141   Potassium (mmol/L)  Date Value  05/17/2019 6.7 (HH)   Magnesium (mg/dL)  Date Value  05/18/2019 2.1   Phosphorus (mg/dL)  Date Value  05/26/2019 8.3 (H)   Calcium (mg/dL)  Date Value  06/01/2019 6.1 (LL)   Albumin (g/dL)  Date Value  05/14/2019 1.5 (L)  08/09/2018 4.6   Corrected Calcium: 8.7  Assessment/Plan: Patient receiving Sodium Bicarb infusion at 156mL/hr. Patient receiving 2K CRRT bath.   Potassium elevated. Calcium gluconate 1g IV x 1 ordered by CCM.   Potassium replacement should only be done after consulting with nephrologist.   Labs ordered Q6hr per CRRT protocol.   Will replace to maintain potassium ~ 4 and goal magnesium ~ 2.   Pharmacy will continue to monitor and adjust per consult.   Marcus Foster L 06/04/2019 6:19 PM

## 2019-06-14 NOTE — Progress Notes (Signed)
CRITICAL CARE PROGRESS NOTE    Name: ADORIAN GWYNNE MRN: [id number] DOB: Aug 25, 1961     LOS: 1   SUBJECTIVE FINDINGS & SIGNIFICANT EVENTS   Patient description: This is a 58 year old male with an extensive history of polysubstance abuse, alcohol abuse, congestive heart failure, and dilated cardiomyopathy who was found down unresponsive and brought to the emergency room, intubated and now in multiorgan failure with extremely low chances for survival.   Lines / Drains: HD temp catheter - femoral CVL - CRRT  Cultures / Sepsis markers: Blood cx, tracheal aspirate, COVID negative  Antibiotics: Doxycycline and rocephin   Protocols / Consultants: Nephrology - Dr Orlene Plum Neurology - for assessment of anoxic brain injury   PAST MEDICAL HISTORY   Past Medical History:  Diagnosis Date  . Acid reflux   . Asthma   . Chronic systolic CHF (congestive heart failure) ([place])    a. TTE 08/2017: EF 20-25%, moderate LVH, unable to exclude RWMA, mild to moderate MR, moderately dilated LV, mildly dilated RV with moderately reduced RVSF, mildly dilated RA, trivial pericardial effusion  . Dysphagia   . Esophageal candidiasis ([place])   . Esophageal stricture   . Hypertension   . Noncompliance   . Obesity   . Osteoarthritis   . Polysubstance abuse ([place])    a. ongoing cocaine, tobacco, and alcohol abuse     SURGICAL HISTORY   Past Surgical History:  Procedure Laterality Date  . COLONOSCOPY    . CORONARY ANGIOPLASTY    . ESOPHAGOGASTRODUODENOSCOPY    . RIGHT/LEFT HEART CATH AND CORONARY ANGIOGRAPHY N/A 11/19/2017   Procedure: RIGHT/LEFT HEART CATH AND CORONARY ANGIOGRAPHY;  Surgeon: Wellington Hampshire, MD;  Location: [facility];  Service: Cardiovascular;  Laterality: N/A;     FAMILY HISTORY   Family History   Problem Relation Age of Onset  . Hypertension Mother      SOCIAL HISTORY   Social History   Tobacco Use  . Smoking status: Current Every Day Smoker    Packs/day: 0.25    Years: 30.00    Pack years: 7.50  . Smokeless tobacco: Never Used  Substance Use Topics  . Alcohol use: Yes    Comment: "couple shots a day" and beer  . Drug use: Yes    Types: Cocaine     MEDICATIONS   Current Medication:  Current Facility-Administered Medications:  .  0.9 %  sodium chloride infusion, 10 mL/hr, Intravenous, Once, Earleen Newport, MD, Stopped at 06/11/19 [phone] .  0.9 %  sodium chloride infusion, 250 mL, Intravenous, PRN, Seals, Angela H, NP .  cefTRIAXone (ROCEPHIN) 2 g in sodium chloride 0.9 % 100 mL IVPB, 2 g, Intravenous, Q12H, Dallie Piles, RPH, Last Rate: 200 mL/hr at 07-11-2019 0020, 2 g at 07-11-19 0020 .  chlorhexidine gluconate (MEDLINE KIT) (PERIDEX) 0.12 % solution 15 mL, 15 mL, Mouth Rinse, BID, Tukov-Yual, Magdalene S, NP, 15 mL at 06/11/19 1955 .  Chlorhexidine Gluconate Cloth 2 % PADS 6 each, 6 each, Topical, Daily, Tukov-Yual, Magdalene S, NP, 6 each at 06/11/19 1559 .  doxycycline (VIBRAMYCIN) 100 mg in sodium chloride 0.9 % 250 mL IVPB, 100 mg, Intravenous, Q12H, Seals, Angela H, NP, Last Rate: 125 mL/hr at July 11, 2019 0428, 100 mg at 11-Jul-2019 0428 .  fentaNYL 2567mg in NS 2573m(1070mml) infusion-PREMIX, 0-400 mcg/hr, Intravenous, Continuous, Maison Kestenbaum, MD, Last Rate: 15 mL/hr at 08/09-28-2000, 150 mcg/hr at 05/22/27/2000 .  folic acid (FOLVITE) tablet 1 mg, 1  mg, Oral, Daily, Seals, Sherrill H, NP .  heparin injection 1,000-6,000 Units, 1,000-6,000 Units, CRRT, PRN, Liana Gerold, MD, 1,000 Units at July 02, 2019 0002 .  insulin aspart (novoLOG) injection 0-9 Units, 0-9 Units, Subcutaneous, Q4H, Tukov-Yual, Magdalene S, NP, 1 Units at 06/11/19 1231 .  ipratropium-albuterol (DUONEB) 0.5-2.5 (3) MG/3ML nebulizer solution 3 mL, 3 mL, Nebulization, Q6H, Tukov-Yual,  Magdalene S, NP, 3 mL at 07-02-19 0718 .  MEDLINE mouth rinse, 15 mL, Mouth Rinse, 10 times per day, Tukov-Yual, Magdalene S, NP, 15 mL at 07/02/19 0622 .  multivitamin with minerals tablet 1 tablet, 1 tablet, Oral, Daily, Seals, Angela H, NP .  norepinephrine (LEVOPHED) 16 mg in 270m premix infusion, 0-40 mcg/min, Intravenous, Titrated, AOttie Glazier MD, Stopped at 02020-09-190221 .  pantoprazole (PROTONIX) injection 40 mg, 40 mg, Intravenous, Daily, Tukov-Yual, Magdalene S, NP, 40 mg at 06/11/19 1103 .  phenylephrine (NEO-SYNEPHRINE) 40 mg in sodium chloride 0.9 % 250 mL (0.16 mg/mL) infusion, 0-400 mcg/min, Intravenous, Titrated, Violia Knopf, MD, Last Rate: 120 mL/hr at 009/19/200600, 320 mcg/min at 0Sep 19, 20200600 .  pureflow IV solution for Dialysis, , CRRT, Continuous, Bhutani, Manpreet S, MD, Last Rate: 2,000 mL/hr at 009-19-200055 .  sodium bicarbonate 150 mEq in dextrose 5 % 1,000 mL infusion, , Intravenous, Continuous, Tukov-Yual, Magdalene S, NP, Last Rate: 125 mL/hr at 0Sep 19, 20200600 .  sodium chloride flush (NS) 0.9 % injection 3 mL, 3 mL, Intravenous, Q12H, Seals, Angela H, NP, 3 mL at 06/11/19 2105 .  sodium chloride flush (NS) 0.9 % injection 3 mL, 3 mL, Intravenous, PRN, Seals, Angela H, NP .  thiamine (VITAMIN B-1) tablet 100 mg, 100 mg, Oral, Daily **OR** thiamine (B-1) injection 100 mg, 100 mg, Intravenous, Daily, Seals, Angela H, NP, 100 mg at 06/11/19 1102 .  vasopressin (PITRESSIN) 40 Units in sodium chloride 0.9 % 250 mL (0.16 Units/mL) infusion, 0.03 Units/min, Intravenous, Continuous, Joyanna Kleman, MD, Last Rate: 11.25 mL/hr at 009/19/20200621, 0.03 Units/min at 0September 19, [id number]   ALLERGIES   Patient has no known allergies.    REVIEW OF SYSTEMS     Unable to obtain due to acutely comatose state  PHYSICAL EXAMINATION   Vital Signs: Temp:  [94.8 F (34.9 C)-100.6 F (38.1 C)] 98.2 F (36.8 C) (08/30 0650) Pulse Rate:  [95-143] 95 (08/29 2240) Resp:   [12-37] 26 (08/30 0650) BP: (72-116)/(37-72) 79/55 (08/30 0650) SpO2:  [90 %-99 %] 95 % (08/30 0718) FiO2 (%):  [30 %-50 %] 50 % (08/30 0718) Weight:  [84.8 kg] 84.8 kg (08/30 0456)  GENERAL:GCS 3  HEAD: Normocephalic, atraumatic.  EYES: Pupils equal, round, reactive to light.  No scleral icterus.  MOUTH: Moist mucosal membrane. NECK: Supple. No thyromegaly. No nodules. No JVD.  PULMONARY: bilateral crackles on auscultation  CARDIOVASCULAR: S1 and S2. Regular rate and rhythm. No murmurs, rubs, or gallops.  GASTROINTESTINAL: Soft, nontender, non-distended. No masses. Positive bowel sounds. No hepatosplenomegaly.  MUSCULOSKELETAL: No swelling, clubbing, or edema.  NEUROLOGIC: GCS3T SKIN:intact,warm,dry   PERTINENT DATA     Infusions: . sodium chloride Stopped (06/11/19 0535)  . sodium chloride    . cefTRIAXone (ROCEPHIN)  IV 2 g (019-Sep-20200020)  . doxycycline (VIBRAMYCIN) IV 100 mg (02020-09-190428)  . fentaNYL infusion INTRAVENOUS 150 mcg/hr (02020/09/190600)  . norepinephrine (LEVOPHED) Adult infusion Stopped (009-19-20200221)  . phenylephrine (NEO-SYNEPHRINE) Adult infusion 320 mcg/min (009-19-200600)  . pureflow 2,000 mL/hr at 009/19/20200055  .  sodium bicarbonate  infusion 1000 mL  125 mL/hr at 07-11-19 0600  . vasopressin (PITRESSIN) infusion - *FOR SHOCK* 0.03 Units/min (07-11-19 3790)   Scheduled Medications: . chlorhexidine gluconate (MEDLINE KIT)  15 mL Mouth Rinse BID  . Chlorhexidine Gluconate Cloth  6 each Topical Daily  . folic acid  1 mg Oral Daily  . insulin aspart  0-9 Units Subcutaneous Q4H  . ipratropium-albuterol  3 mL Nebulization Q6H  . mouth rinse  15 mL Mouth Rinse 10 times per day  . multivitamin with minerals  1 tablet Oral Daily  . pantoprazole (PROTONIX) IV  40 mg Intravenous Daily  . sodium chloride flush  3 mL Intravenous Q12H  . thiamine  100 mg Oral Daily   Or  . thiamine  100 mg Intravenous Daily   PRN Medications: sodium chloride, heparin,  sodium chloride flush Hemodynamic parameters:   Intake/Output: 08/29 0701 - 08/30 0700 In: 5697.5 [I.V.:5095.5; IV [id number]:992] Out: 0   Ventilator  Settings: Vent Mode: PRVC FiO2 (%):  [30 %-50 %] 50 % Set Rate:  [18 bmp] 18 bmp Vt Set:  [500 mL] 500 mL PEEP:  [5 cmH20] 5 cmH20 Plateau Pressure:  [13 EQA83-41 cmH20] 13 cmH20   Other Labs:   LAB RESULTS:  Basic Metabolic Panel: Recent Labs  Lab 06/11/19 0513 06/11/19 1550 06/11/19 2013 06/11/19 2343 07/11/2019 0532  NA 136 139 135 141 141  K 4.1 3.2* 3.7 4.7 4.8  CL 98 94* 89* 93* 96*  CO2 16* 17* 16* 13* 14*  GLUCOSE 266* 86 91 46* 149*  BUN 28* 28* 29* 30* 25*  CREATININE 4.79* 4.73* 4.86* 5.22* 4.04*  CALCIUM 7.7* 6.9* 6.7* 6.5* 6.4*  MG 1.6* 1.4*  --  2.8* 2.4  2.4  PHOS 7.8* 5.0* 6.2* 7.7* 7.1*  7.1*   Liver Function Tests: Recent Labs  Lab 05/30/2019 2138 06/11/19 0513 06/11/19 1550 06/11/19 2013 07/11/2019 0532  AST 214* 289*  --   --   --   ALT 74* 86*  --   --   --   ALKPHOS 140* 175*  --   --   --   BILITOT 6.9* 7.3*  --   --   --   PROT 6.5 6.8  --   --   --   ALBUMIN 1.8* 1.8* 1.8* 1.7* 1.4*   No results for input(s): LIPASE, AMYLASE in the last 168 hours. Recent Labs  Lab 05/26/2019 2331  AMMONIA 157*   CBC: Recent Labs  Lab 06/07/2019 2138 06/11/19 0513 07/11/2019 0532  WBC 12.4* 13.1* 16.2*  NEUTROABS 11.0* 11.8*  --   HGB 8.0* 10.7* 9.0*  HCT 24.5* 33.5* 28.1*  MCV 96.8 97.7 99.6  PLT 56* 67* 41*   Cardiac Enzymes: No results for input(s): CKTOTAL, CKMB, CKMBINDEX, TROPONINI in the last 168 hours. BNP: Invalid input(s): POCBNP CBG: Recent Labs  Lab 06/11/19 2015 2019/07/11 0010 07-11-2019 0039 2019-07-11 0432 Jul 11, 2019 0810  GLUCAP 87 32* 99 78 53*     IMAGING RESULTS:  Imaging: Ct Abdomen Pelvis Wo Contrast  Result Date: 05/15/2019 CLINICAL DATA:  58 year old male with unresponsiveness. EXAM: CT CHEST, ABDOMEN AND PELVIS WITHOUT CONTRAST TECHNIQUE: Multidetector CT  imaging of the chest, abdomen and pelvis was performed following the standard protocol without IV contrast. COMPARISON:  Abdominal ultrasound dated 03/31/2010 and CT dated 03/31/2010 FINDINGS: Evaluation of this exam is limited in the absence of intravenous contrast. CT CHEST FINDINGS Cardiovascular: There is no cardiomegaly or pericardial effusion. Advanced multi vessel coronary vascular calcification noted. There is  mild atherosclerotic calcification of the aorta. No aneurysmal dilatation. The central pulmonary arteries are grossly unremarkable. Mediastinum/Nodes: There is no hilar or mediastinal adenopathy. The esophagus is grossly unremarkable. No mediastinal fluid collection. Lungs/Pleura: There are bibasilar subsegmental atelectasis. Scattered upper lobe predominant hazy airspace densities with peripheral and subpleural distribution most consistent with multifocal pneumonia, possibly atypical etiology. Clinical correlation is recommended. A 2.6 x 1.8 cm focal consolidative area in the right upper lobe likely infectious. Follow-up to resolution recommended to exclude neoplasm. No significant pleural effusion. There is no pneumothorax. The central airways are patent. An endotracheal tube is noted with tip approximately 3.3 cm above the carina. Musculoskeletal: There is degenerative changes of the spine. Degenerative changes of the spine. No acute osseous pathology. CT ABDOMEN PELVIS FINDINGS There is no intra-abdominal free air. Trace free fluid within pelvis. Hepatobiliary: Advanced fatty infiltration of the liver. Slight irregularity of the liver contour may represent early changes of cirrhosis. No intrahepatic biliary ductal dilatation. High attenuating content within the gallbladder most consistent with sludge or hyper concentrated bile. No pericholecystic fluid. Pancreas: There is peripancreatic stranding and inflammatory changes. These findings may be related to underlying liver disease or represent acute  pancreatitis. Correlation with pancreatic enzymes recommended. No drainable fluid collection/abscess or pseudocyst. Spleen: Normal in size without focal abnormality. Adrenals/Urinary Tract: The adrenal glands are unremarkable. There is no hydronephrosis or nephrolithiasis on either side. The visualized ureters appear unremarkable. The urinary bladder is decompressed around a Foley catheter. Stomach/Bowel: There is diffuse thickening of the colon. Although this may be related to underlying liver disease findings concerning for pancolitis. Clinical correlation is recommended. There is no bowel obstruction. The appendix is normal. Vascular/Lymphatic: Moderate aortoiliac atherosclerotic disease. A right femoral venous line with tip in the right external iliac vein is noted. No portal venous gas. There is no adenopathy. Reproductive: The prostate and seminal vesicles are grossly unremarkable. No pelvic mass. Other: Mild diffuse subcutaneous edema. Musculoskeletal: Osteopenia. Moderate bilateral hip osteoarthritic changes. No acute osseous pathology. IMPRESSION: 1. Scattered airspace opacities most consistent with multifocal pneumonia. Clinical correlation and follow-up recommended. 2. Advanced fatty infiltration of the liver with probable early changes of cirrhosis. 3. Inflammatory changes of the upper abdomen may be related to underlying liver disease or acute pancreatitis. Correlation with pancreatic enzymes recommended. 4. Hepatic colopathy versus pancolitis. Clinical correlation is recommended. No bowel obstruction. Normal appendix. 5. Aortic Atherosclerosis (ICD10-I70.0). Electronically Signed   By: Anner Crete M.D.   On: 05/31/2019 23:42   Dg Chest 1 View  Result Date: 05/19/2019 CLINICAL DATA:  Post intubation EXAM: CHEST  1 VIEW COMPARISON:  11/14/2017 FINDINGS: Endotracheal tube tip is about 4.7 cm superior to the carina. No consolidation or effusion. No pneumothorax. Normal cardiomediastinal  silhouette. IMPRESSION: 1. Endotracheal tube tip about 4.7 cm superior to carina 2. Clear lung fields Electronically Signed   By: Donavan Foil M.D.   On: 06/08/2019 23:04   Ct Head Wo Contrast  Result Date: 06/02/2019 CLINICAL DATA:  Altered level of consciousness EXAM: CT HEAD WITHOUT CONTRAST TECHNIQUE: Contiguous axial images were obtained from the base of the skull through the vertex without intravenous contrast. COMPARISON:  None. FINDINGS: Brain: No acute intracranial abnormality. Specifically, no hemorrhage, hydrocephalus, mass lesion, acute infarction, or significant intracranial injury. Vascular: No hyperdense vessel or unexpected calcification. Skull: No acute calvarial abnormality. Sinuses/Orbits: No acute finding Other: None IMPRESSION: No acute intracranial abnormality. Electronically Signed   By: Rolm Baptise M.D.   On: 06/11/2019 23:26  Ct Chest Wo Contrast  Result Date: 05/27/2019 CLINICAL DATA:  58 year old male with unresponsiveness. EXAM: CT CHEST, ABDOMEN AND PELVIS WITHOUT CONTRAST TECHNIQUE: Multidetector CT imaging of the chest, abdomen and pelvis was performed following the standard protocol without IV contrast. COMPARISON:  Abdominal ultrasound dated 03/31/2010 and CT dated 03/31/2010 FINDINGS: Evaluation of this exam is limited in the absence of intravenous contrast. CT CHEST FINDINGS Cardiovascular: There is no cardiomegaly or pericardial effusion. Advanced multi vessel coronary vascular calcification noted. There is mild atherosclerotic calcification of the aorta. No aneurysmal dilatation. The central pulmonary arteries are grossly unremarkable. Mediastinum/Nodes: There is no hilar or mediastinal adenopathy. The esophagus is grossly unremarkable. No mediastinal fluid collection. Lungs/Pleura: There are bibasilar subsegmental atelectasis. Scattered upper lobe predominant hazy airspace densities with peripheral and subpleural distribution most consistent with multifocal pneumonia,  possibly atypical etiology. Clinical correlation is recommended. A 2.6 x 1.8 cm focal consolidative area in the right upper lobe likely infectious. Follow-up to resolution recommended to exclude neoplasm. No significant pleural effusion. There is no pneumothorax. The central airways are patent. An endotracheal tube is noted with tip approximately 3.3 cm above the carina. Musculoskeletal: There is degenerative changes of the spine. Degenerative changes of the spine. No acute osseous pathology. CT ABDOMEN PELVIS FINDINGS There is no intra-abdominal free air. Trace free fluid within pelvis. Hepatobiliary: Advanced fatty infiltration of the liver. Slight irregularity of the liver contour may represent early changes of cirrhosis. No intrahepatic biliary ductal dilatation. High attenuating content within the gallbladder most consistent with sludge or hyper concentrated bile. No pericholecystic fluid. Pancreas: There is peripancreatic stranding and inflammatory changes. These findings may be related to underlying liver disease or represent acute pancreatitis. Correlation with pancreatic enzymes recommended. No drainable fluid collection/abscess or pseudocyst. Spleen: Normal in size without focal abnormality. Adrenals/Urinary Tract: The adrenal glands are unremarkable. There is no hydronephrosis or nephrolithiasis on either side. The visualized ureters appear unremarkable. The urinary bladder is decompressed around a Foley catheter. Stomach/Bowel: There is diffuse thickening of the colon. Although this may be related to underlying liver disease findings concerning for pancolitis. Clinical correlation is recommended. There is no bowel obstruction. The appendix is normal. Vascular/Lymphatic: Moderate aortoiliac atherosclerotic disease. A right femoral venous line with tip in the right external iliac vein is noted. No portal venous gas. There is no adenopathy. Reproductive: The prostate and seminal vesicles are grossly  unremarkable. No pelvic mass. Other: Mild diffuse subcutaneous edema. Musculoskeletal: Osteopenia. Moderate bilateral hip osteoarthritic changes. No acute osseous pathology. IMPRESSION: 1. Scattered airspace opacities most consistent with multifocal pneumonia. Clinical correlation and follow-up recommended. 2. Advanced fatty infiltration of the liver with probable early changes of cirrhosis. 3. Inflammatory changes of the upper abdomen may be related to underlying liver disease or acute pancreatitis. Correlation with pancreatic enzymes recommended. 4. Hepatic colopathy versus pancolitis. Clinical correlation is recommended. No bowel obstruction. Normal appendix. 5. Aortic Atherosclerosis (ICD10-I70.0). Electronically Signed   By: Anner Crete M.D.   On: 05/31/2019 23:42   Dg Chest Port 1 View  Result Date: 2019-06-29 CLINICAL DATA:  CHF.  Hypertension. EXAM: PORTABLE CHEST 1 VIEW COMPARISON:  05/21/2019 plain film and chest CT. FINDINGS: Endotracheal tube terminates 7.0 Cm above carina. Numerous leads and wires project over the chest. Normal heart size. No pleural effusion or pneumothorax. The peripheral opacities on CT are not readily apparent. Nonspecific interstitial thickening is not significantly changed and likely related to COPD/chronic bronchitis. IMPRESSION: Multifocal pulmonary opacities on yesterday's CT are not readily plain film  apparent. No superimposed lobar consolidation. Electronically Signed   By: Abigail Miyamoto M.D.   On: 06-25-19 05:03      ASSESSMENT AND PLAN    -Multidisciplinary rounds held today  Acute Comatose state   - due to severe acidemia and hypoglycemia likely secondary to drug overdose as patient has hx of polysubstance abuse -remains on MV with GCS3T -family came yesteday and today at bedside - we have discussed prognosis is grim with multi-organ failure -Ammonia elevated - will add lactulose via OGT   Acute on chronic systolic heart failure with EF <30%  -oxygen as needed -on multiple vasopressors at this time- levophed/ neosynephrine -follow up cardiac enzymes as indicated ICU monitoring   Renal Failure-Acute on chronic KDIGO 4 -Oliguric ATN -nephrology on case - appreciate input - Dr Orlene Plum -s/p central venous dialysis catheter - CRRT started yesterday   Acute on chronic liver failure  - severely coagulopathic   - s/p 2 FFP  - INR >6 , Albumin <2 - [facility] hepatology - patient not candidate for transplant list due to ongoing EtOH and drug abuse   Severe protein calorie malnutrition   - s/p evaluation by RD    - will hold off on feeds at this time due to severely ill state   ID -continue IV abx as prescibed -follow up cultures -empirically on doxy and rocephin    GI/Nutrition GI PROPHYLAXIS as indicated-protonix bid DIET-->TF's as tolerated Constipation protocol as indicated  ENDO - ICU hypoglycemic\Hyperglycemia protocol -check FSBS per protocol   ELECTROLYTES -follow labs as needed -replace as needed -pharmacy consultation   DVT/GI PRX ordered -SCDs  TRANSFUSIONS AS NEEDED MONITOR FSBS ASSESS the need for LABS as needed   Critical care provider statement:    Critical care time (minutes):  109   Critical care time was exclusive of:  Separately billable procedures and treating other patients   Critical care was necessary to treat or prevent imminent or life-threatening deterioration of the following conditions:  acutely comatose, circulatory shock, liver failure, acute decompensated chf, renal failure, drug overdose   Critical care was time spent personally by me on the following activities:  Development of treatment plan with patient or surrogate, discussions with consultants, evaluation of patient's response to treatment, examination of patient, obtaining history from patient or surrogate, ordering and performing treatments and interventions, ordering and review of laboratory studies and re-evaluation of  patient's condition.  I assumed direction of critical care for this patient from another provider in my specialty: no    This document was prepared using Dragon voice recognition software and may include unintentional dictation errors.    Ottie Glazier, M.D.  Division of [place]

## 2019-06-14 NOTE — Progress Notes (Signed)
Pharmacy Electrolyte Monitoring Consult:  Pharmacy consulted to assist in monitoring and replacing electrolytes in this 58 y.o. male admitted on 05/21/2019 with Hypoglycemia and Loss of Consciousness  Patient currently requiring CRRT and mechanical ventilation.   Labs:  Sodium (mmol/L)  Date Value  06/11/2019 141  08/09/2018 141   Potassium (mmol/L)  Date Value  06/11/2019 4.7   Magnesium (mg/dL)  Date Value  06/11/2019 2.8 (H)   Phosphorus (mg/dL)  Date Value  06/11/2019 7.7 (H)   Calcium (mg/dL)  Date Value  06/11/2019 6.5 (L)   Albumin (g/dL)  Date Value  06/11/2019 1.7 (L)  08/09/2018 4.6   Corrected Calcium: 8.7  Assessment/Plan: Patient receiving Sodium Bicarb infusion at 118mL/hr. Patient receiving 2K CRRT bath.   K WNL, Mag/Phos elevated, no replacement needed at this time, will continue to monitor.  Potassium replacement should only be done after consulting with nephrologist.   Labs ordered Q6hr per CRRT protocol.   Will replace to maintain potassium ~ 4 and goal magnesium ~ 2.   Pharmacy will continue to monitor and adjust per consult.   Tobie Lords, PharmD, BCPS Clinical Pharmacist 2019-06-18 1:08 AM

## 2019-06-14 DEATH — deceased

## 2019-06-15 LAB — CULTURE, BLOOD (ROUTINE X 2): Culture: NO GROWTH

## 2019-06-16 LAB — CULTURE, BLOOD (ROUTINE X 2): Culture: NO GROWTH

## 2020-05-29 IMAGING — CT CT HEAD WITHOUT CONTRAST
3 series · 15 of 47 positions shown, 18 images · non-contrast
Comparison: None.

CLINICAL DATA: Altered level of consciousness

EXAM:
CT HEAD WITHOUT CONTRAST
TECHNIQUE: Contiguous axial images were obtained from the base of the skull
through the vertex without intravenous contrast.

[Series 2: head wo · axial · 0.44mm/px · z∈[+524,+649]mm · 9 of 30 slices shown, 12 images]
[im 3/30  brain]
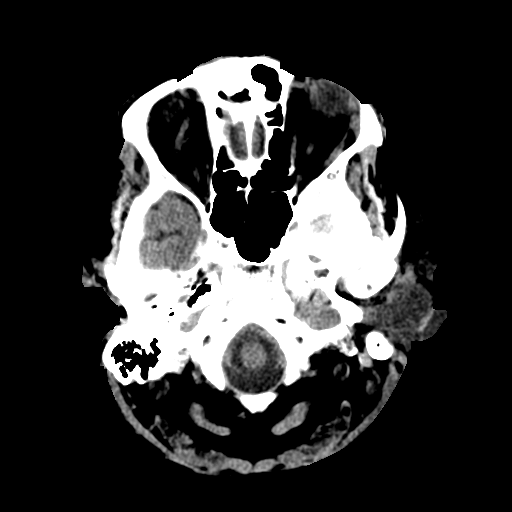
[im 3/30  bone]
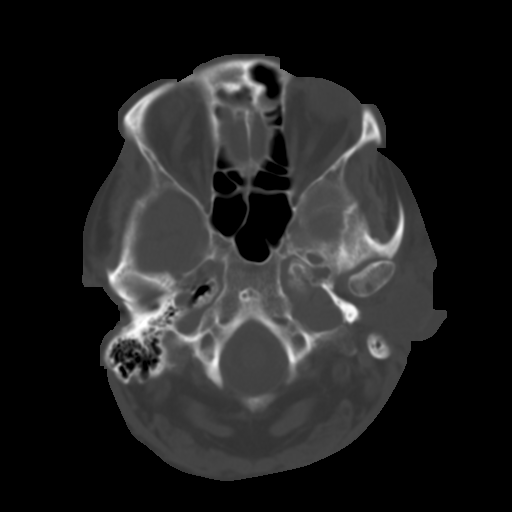
[im 6/30  brain]
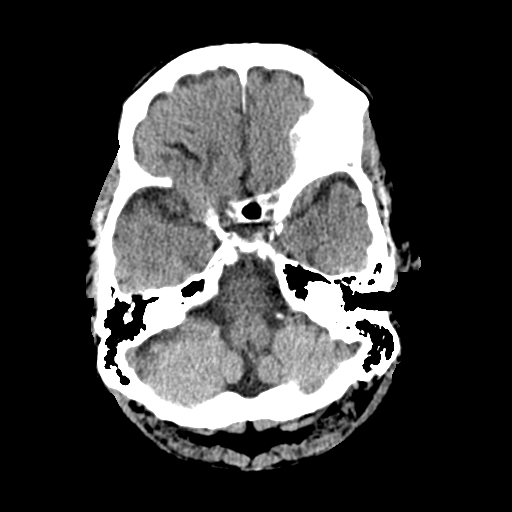
[im 9/30  brain]
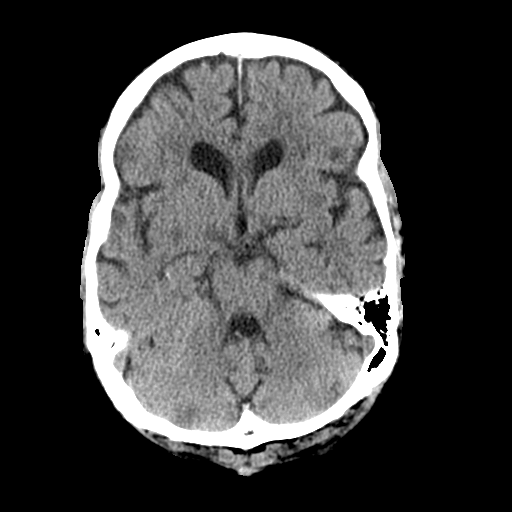
[im 12/30  brain]
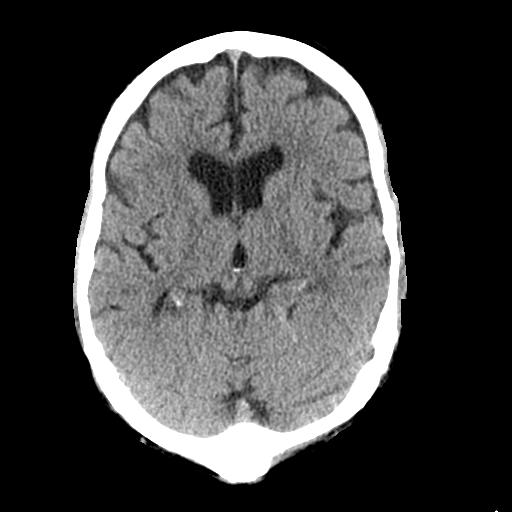
[im 16/30  brain]
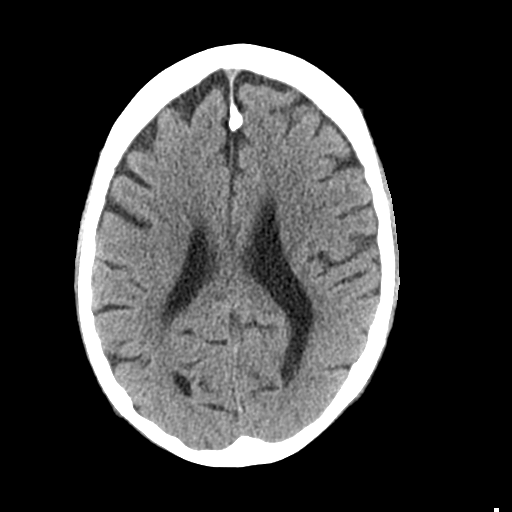
[im 16/30  bone]
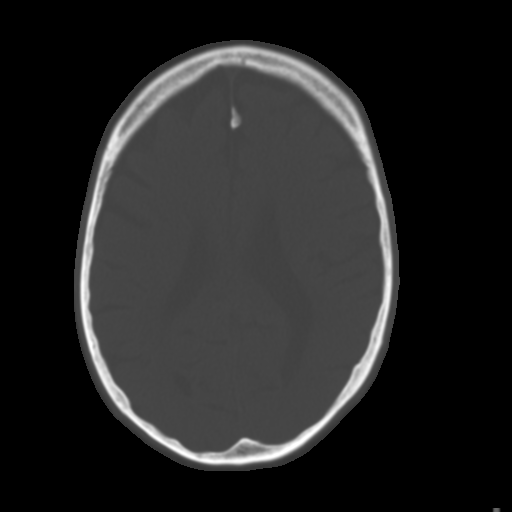
[im 19/30  brain]
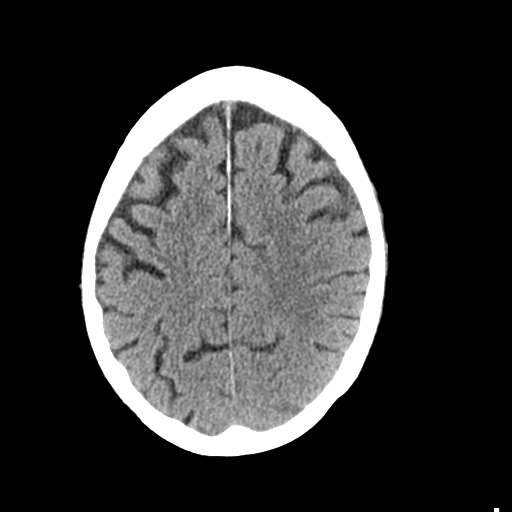
[im 22/30  brain]
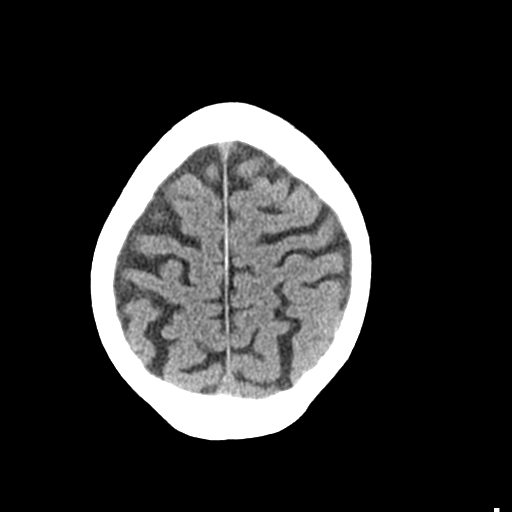
[im 25/30  brain]
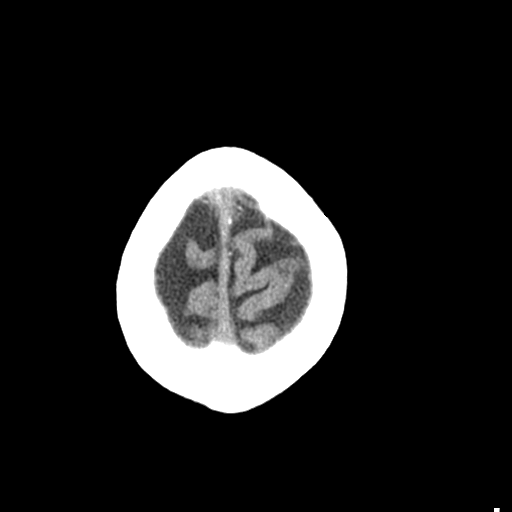
[im 28/30  brain]
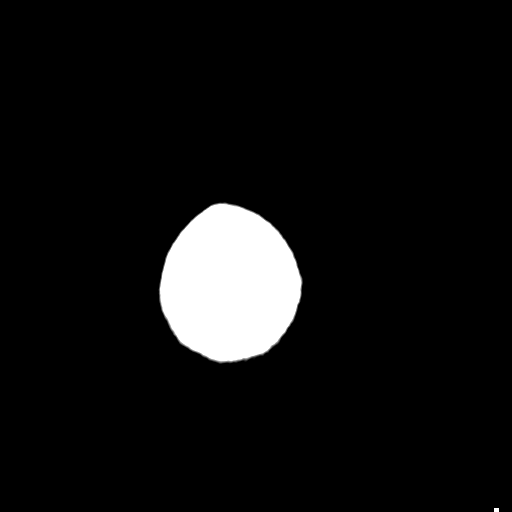
[im 28/30  bone]
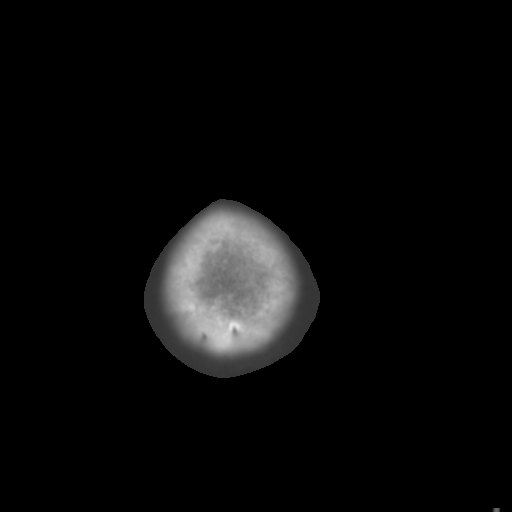

[Series 4: coronal soft tissue · coronal · 0.30mm/px · 3 of 67 slices shown]
[im 23/67  brain]
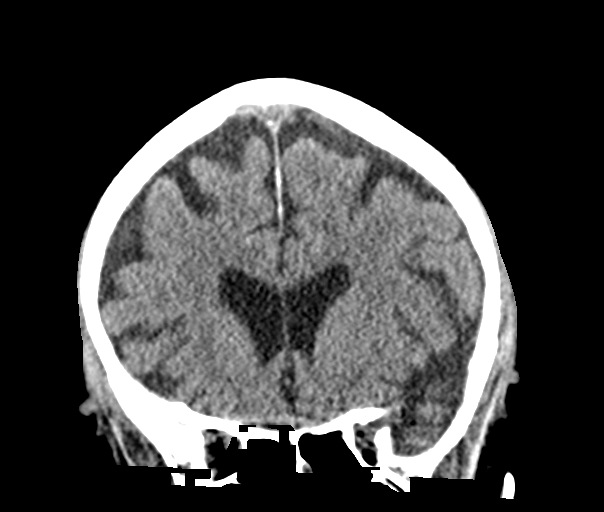
[im 30/67  brain]
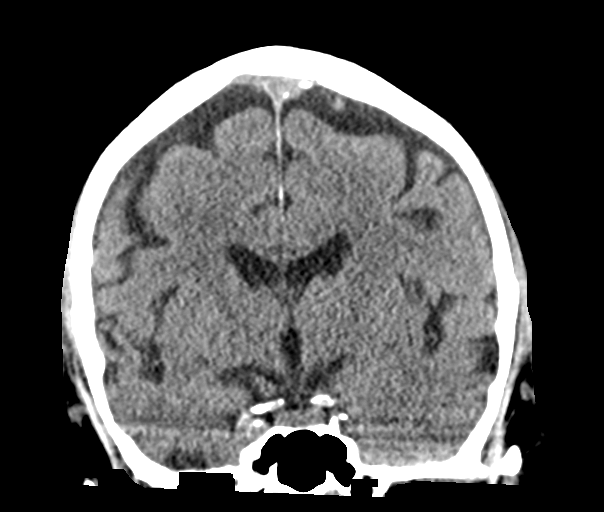
[im 37/67  brain]
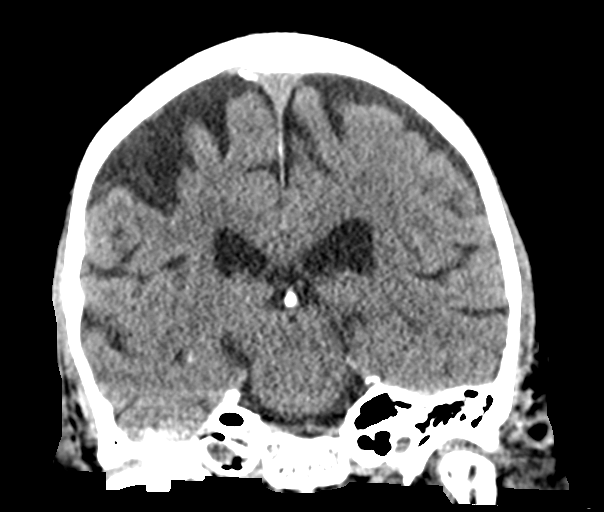

[Series 5: sagittal soft tissue · sagittal · 0.31mm/px · 3 of 54 slices shown]
[im 18/54  brain]
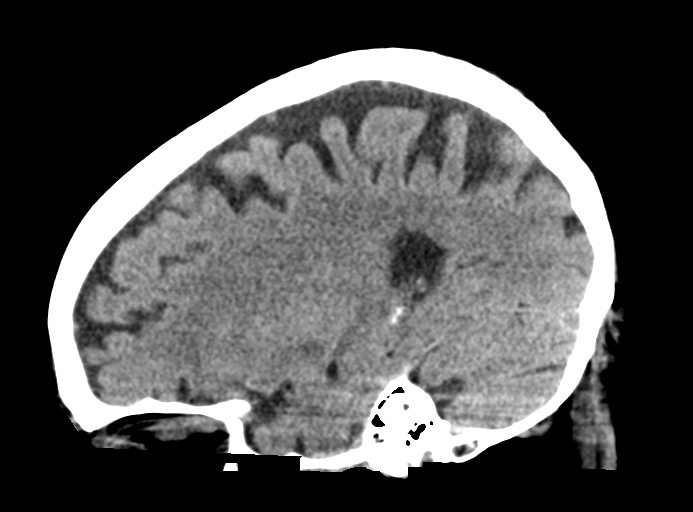
[im 27/54  brain]
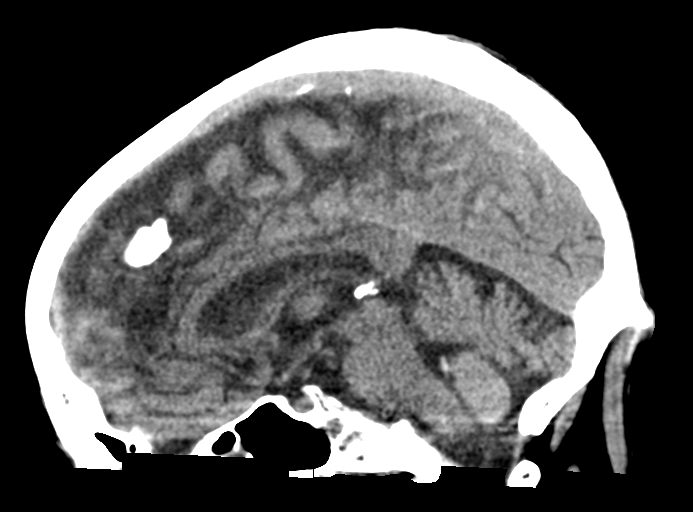
[im 36/54  brain]
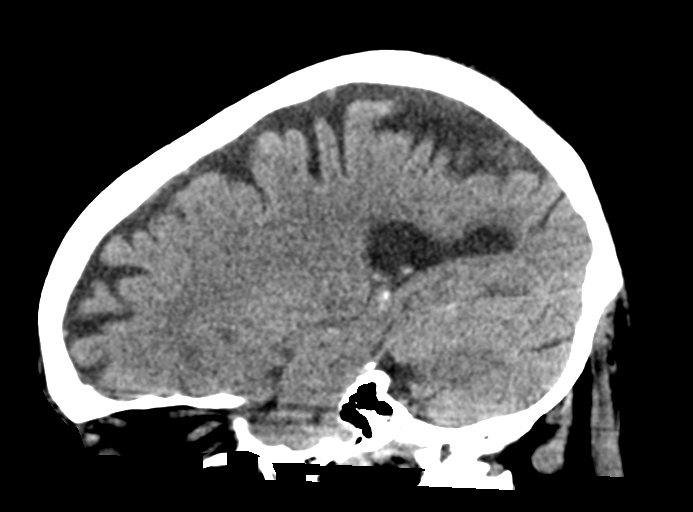

[15 of 47 positions shown; findings below may reference images not displayed]

FINDINGS: Brain: No acute intracranial abnormality. Specifically, no
hemorrhage, hydrocephalus, mass lesion, acute infarction, or
significant intracranial injury.

Vascular: No hyperdense vessel or unexpected calcification.

Skull: No acute calvarial abnormality.

Sinuses/Orbits: No acute finding

Other: None
IMPRESSION: No acute intracranial abnormality.

## 2020-05-29 IMAGING — CT CT ABDOMEN AND PELVIS WITHOUT CONTRAST
2 of 4 series · 13 of 46 positions shown, 15 images · non-contrast
Comparison: Abdominal ultrasound dated 03/31/2010 and CT dated
03/31/2010

CLINICAL DATA: 58-year-old male with unresponsiveness.

EXAM:
CT CHEST, ABDOMEN AND PELVIS WITHOUT CONTRAST
TECHNIQUE: Multidetector CT imaging of the chest, abdomen and pelvis was
performed following the standard protocol without IV contrast.

[Series 2: cap wo st · axial · 0.79mm/px · z∈[-197,+383]mm · 10 of 134 slices shown, 12 images]
[im 9/134  soft-tissue]
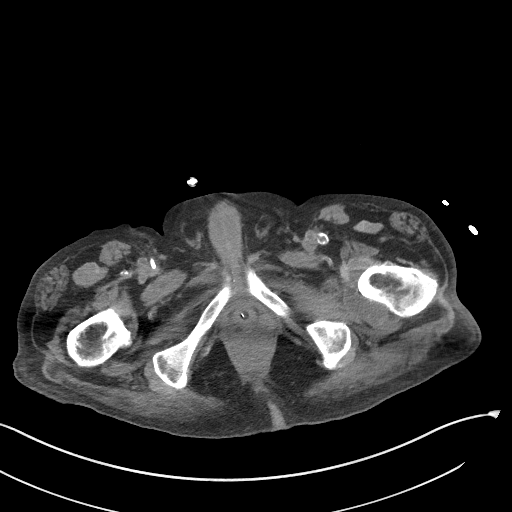
[im 9/134  bone]
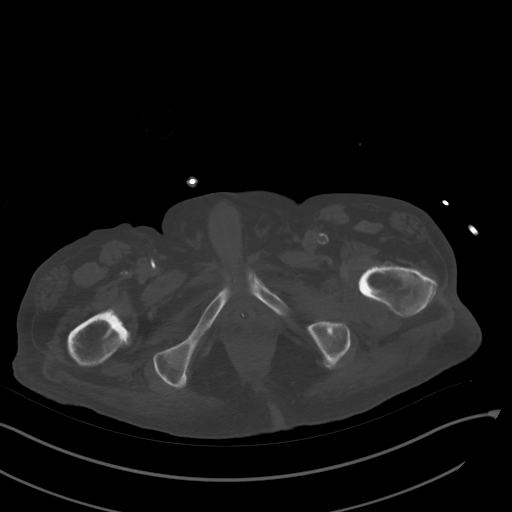
[im 27/134  soft-tissue]
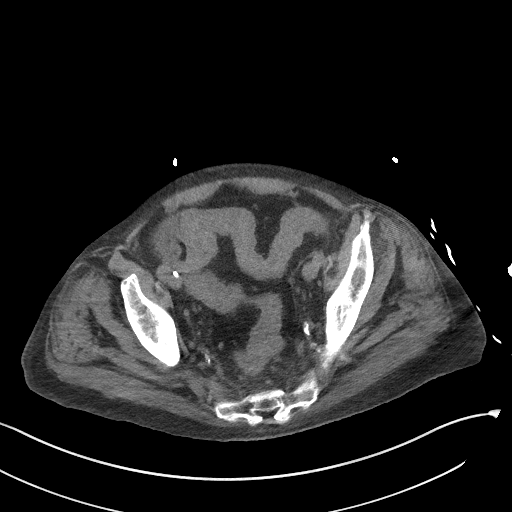
[im 36/134  soft-tissue]
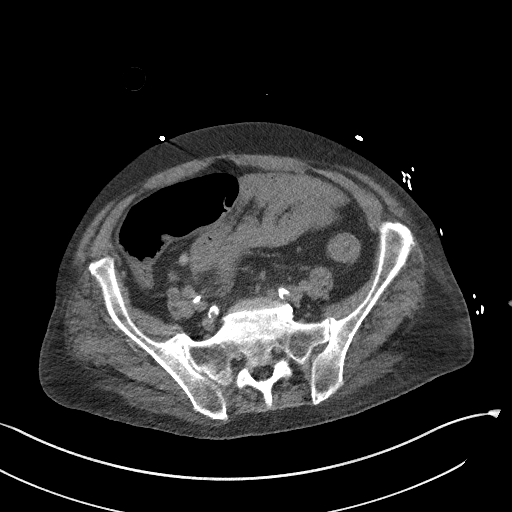
[im 45/134  soft-tissue]
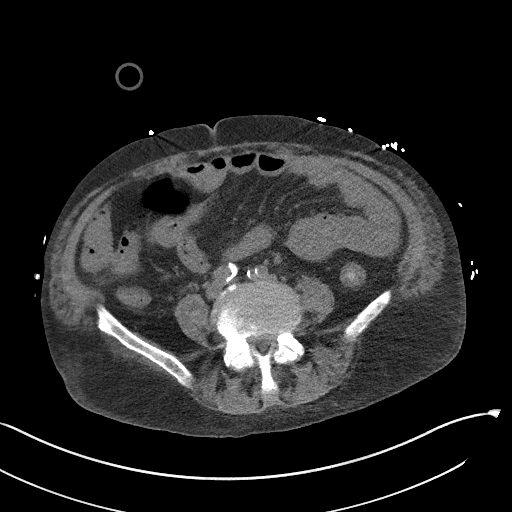
[im 63/134  soft-tissue]
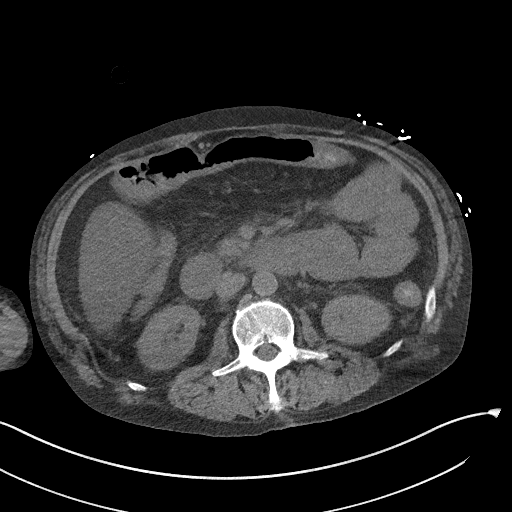
[im 71/134  soft-tissue]
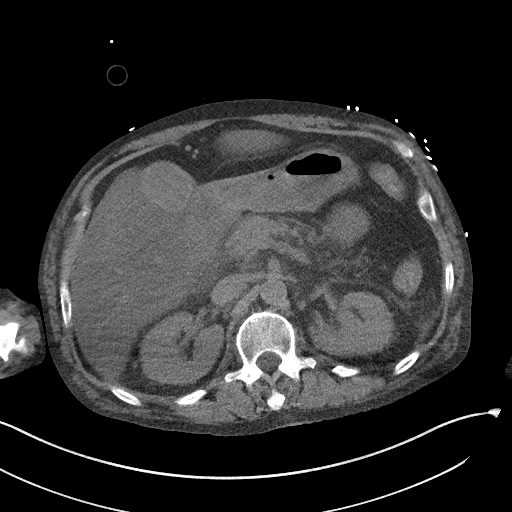
[im 89/134  soft-tissue]
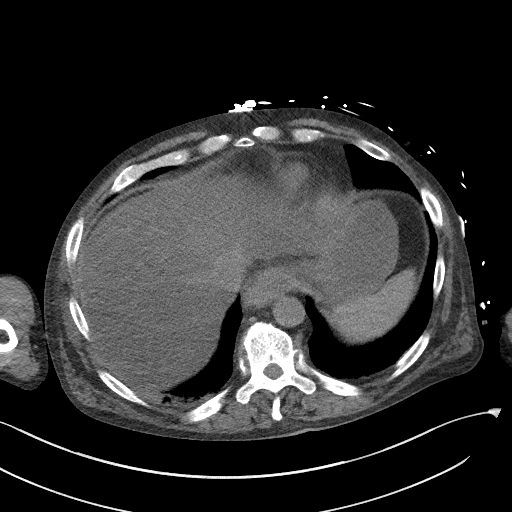
[im 98/134  soft-tissue]
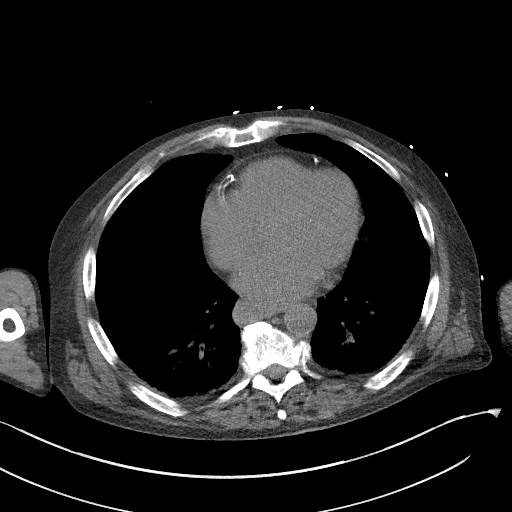
[im 107/134  soft-tissue]
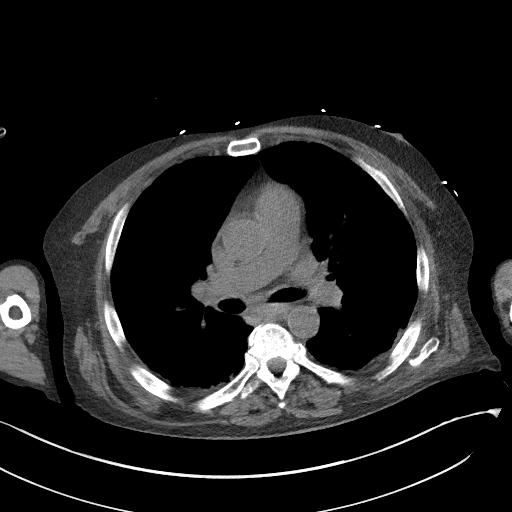
[im 107/134  bone]
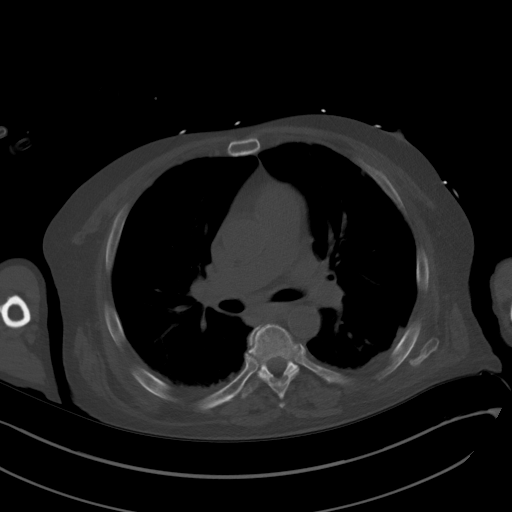
[im 125/134  soft-tissue]
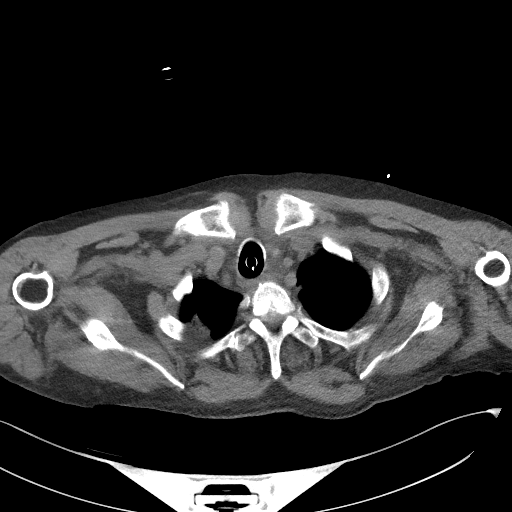

[Series 5: coronal · coronal · 0.75mm/px · 3 of 126 slices shown]
[im 42/126  soft-tissue]
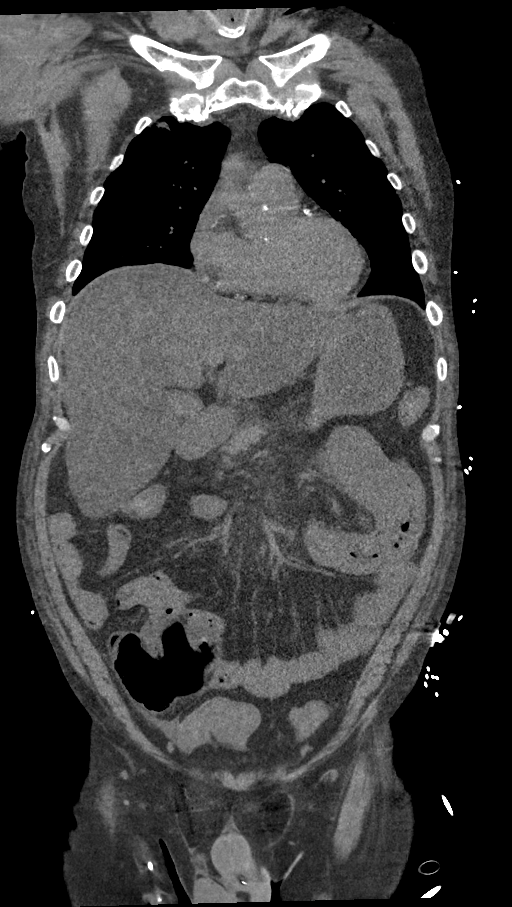
[im 56/126  soft-tissue]
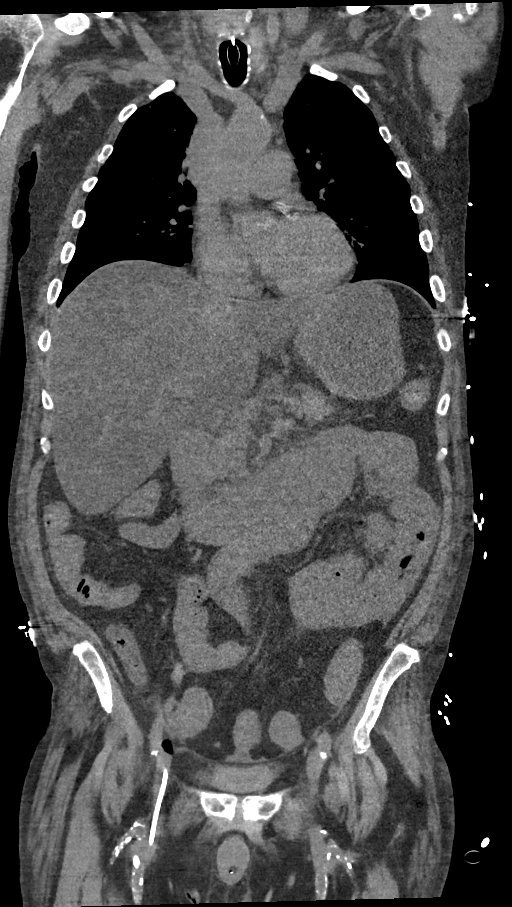
[im 70/126  soft-tissue]
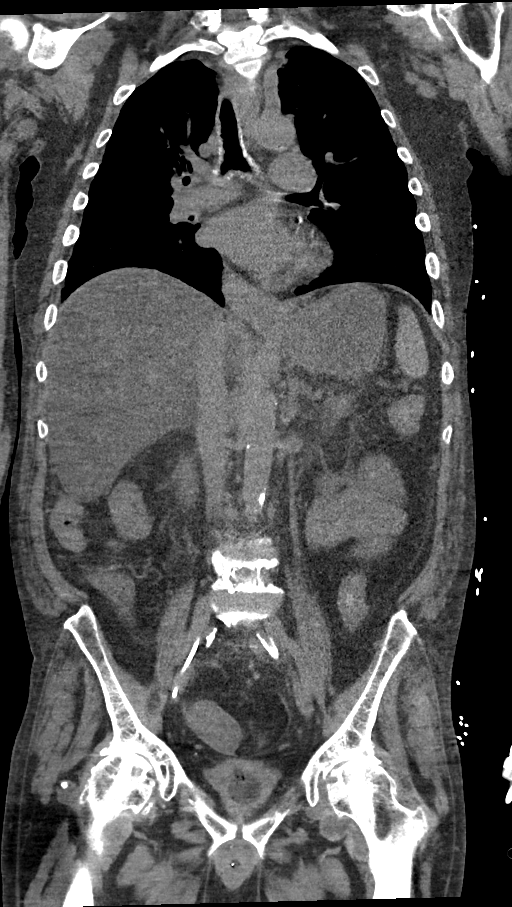

[13 of 46 positions shown; findings below may reference images not displayed]

FINDINGS: Evaluation of this exam is limited in the absence of intravenous
contrast.

CT CHEST FINDINGS

Cardiovascular: There is no cardiomegaly or pericardial effusion.
Advanced multi vessel coronary vascular calcification noted. There
is mild atherosclerotic calcification of the aorta. No aneurysmal
dilatation. The central pulmonary arteries are grossly unremarkable.

Mediastinum/Nodes: There is no hilar or mediastinal adenopathy. The
esophagus is grossly unremarkable. No mediastinal fluid collection.

Lungs/Pleura: There are bibasilar subsegmental atelectasis.
Scattered upper lobe predominant hazy airspace densities with
peripheral and subpleural distribution most consistent with
multifocal pneumonia, possibly atypical etiology. Clinical
correlation is recommended. A 2.6 x 1.8 cm focal consolidative area
in the right upper lobe likely infectious. Follow-up to resolution
recommended to exclude neoplasm. No significant pleural effusion.
There is no pneumothorax. The central airways are patent. An
endotracheal tube is noted with tip approximately 3.3 cm above the
carina.

Musculoskeletal: There is degenerative changes of the spine.
Degenerative changes of the spine. No acute osseous pathology.

CT ABDOMEN PELVIS FINDINGS

There is no intra-abdominal free air. Trace free fluid within
pelvis.

Hepatobiliary: Advanced fatty infiltration of the liver. Slight
irregularity of the liver contour may represent early changes of
cirrhosis. No intrahepatic biliary ductal dilatation. High
attenuating content within the gallbladder most consistent with
sludge or hyper concentrated bile. No pericholecystic fluid.

Pancreas: There is peripancreatic stranding and inflammatory
changes. These findings may be related to underlying liver disease
or represent acute pancreatitis. Correlation with pancreatic enzymes
recommended. No drainable fluid collection/abscess or pseudocyst.

Spleen: Normal in size without focal abnormality.

Adrenals/Urinary Tract: The adrenal glands are unremarkable. There
is no hydronephrosis or nephrolithiasis on either side. The
visualized ureters appear unremarkable. The urinary bladder is
decompressed around a Foley catheter.

Stomach/Bowel: There is diffuse thickening of the colon. Although
this may be related to underlying liver disease findings concerning
for pancolitis. Clinical correlation is recommended. There is no
bowel obstruction. The appendix is normal.

Vascular/Lymphatic: Moderate aortoiliac atherosclerotic disease. A
right femoral venous line with tip in the right external iliac vein
is noted. No portal venous gas. There is no adenopathy.

Reproductive: The prostate and seminal vesicles are grossly
unremarkable. No pelvic mass.

Other: Mild diffuse subcutaneous edema.

Musculoskeletal: Osteopenia. Moderate bilateral hip osteoarthritic
changes. No acute osseous pathology.
IMPRESSION: 1. Scattered airspace opacities most consistent with multifocal
pneumonia. Clinical correlation and follow-up recommended.
2. Advanced fatty infiltration of the liver with probable early
changes of cirrhosis.
3. Inflammatory changes of the upper abdomen may be related to
underlying liver disease or acute pancreatitis. Correlation with
pancreatic enzymes recommended.
4. Hepatic colopathy versus pancolitis. Clinical correlation is
recommended. No bowel obstruction. Normal appendix.
5. Aortic Atherosclerosis ([id number]-[id number].S).

## 2020-05-31 IMAGING — DX PORTABLE CHEST - 1 VIEW
1 series · 1 of 1 positions shown · non-contrast
Comparison: 06/10/2019 plain film and chest CT.

CLINICAL DATA: CHF.  Hypertension.

EXAM:
PORTABLE CHEST 1 VIEW

[chest ap]
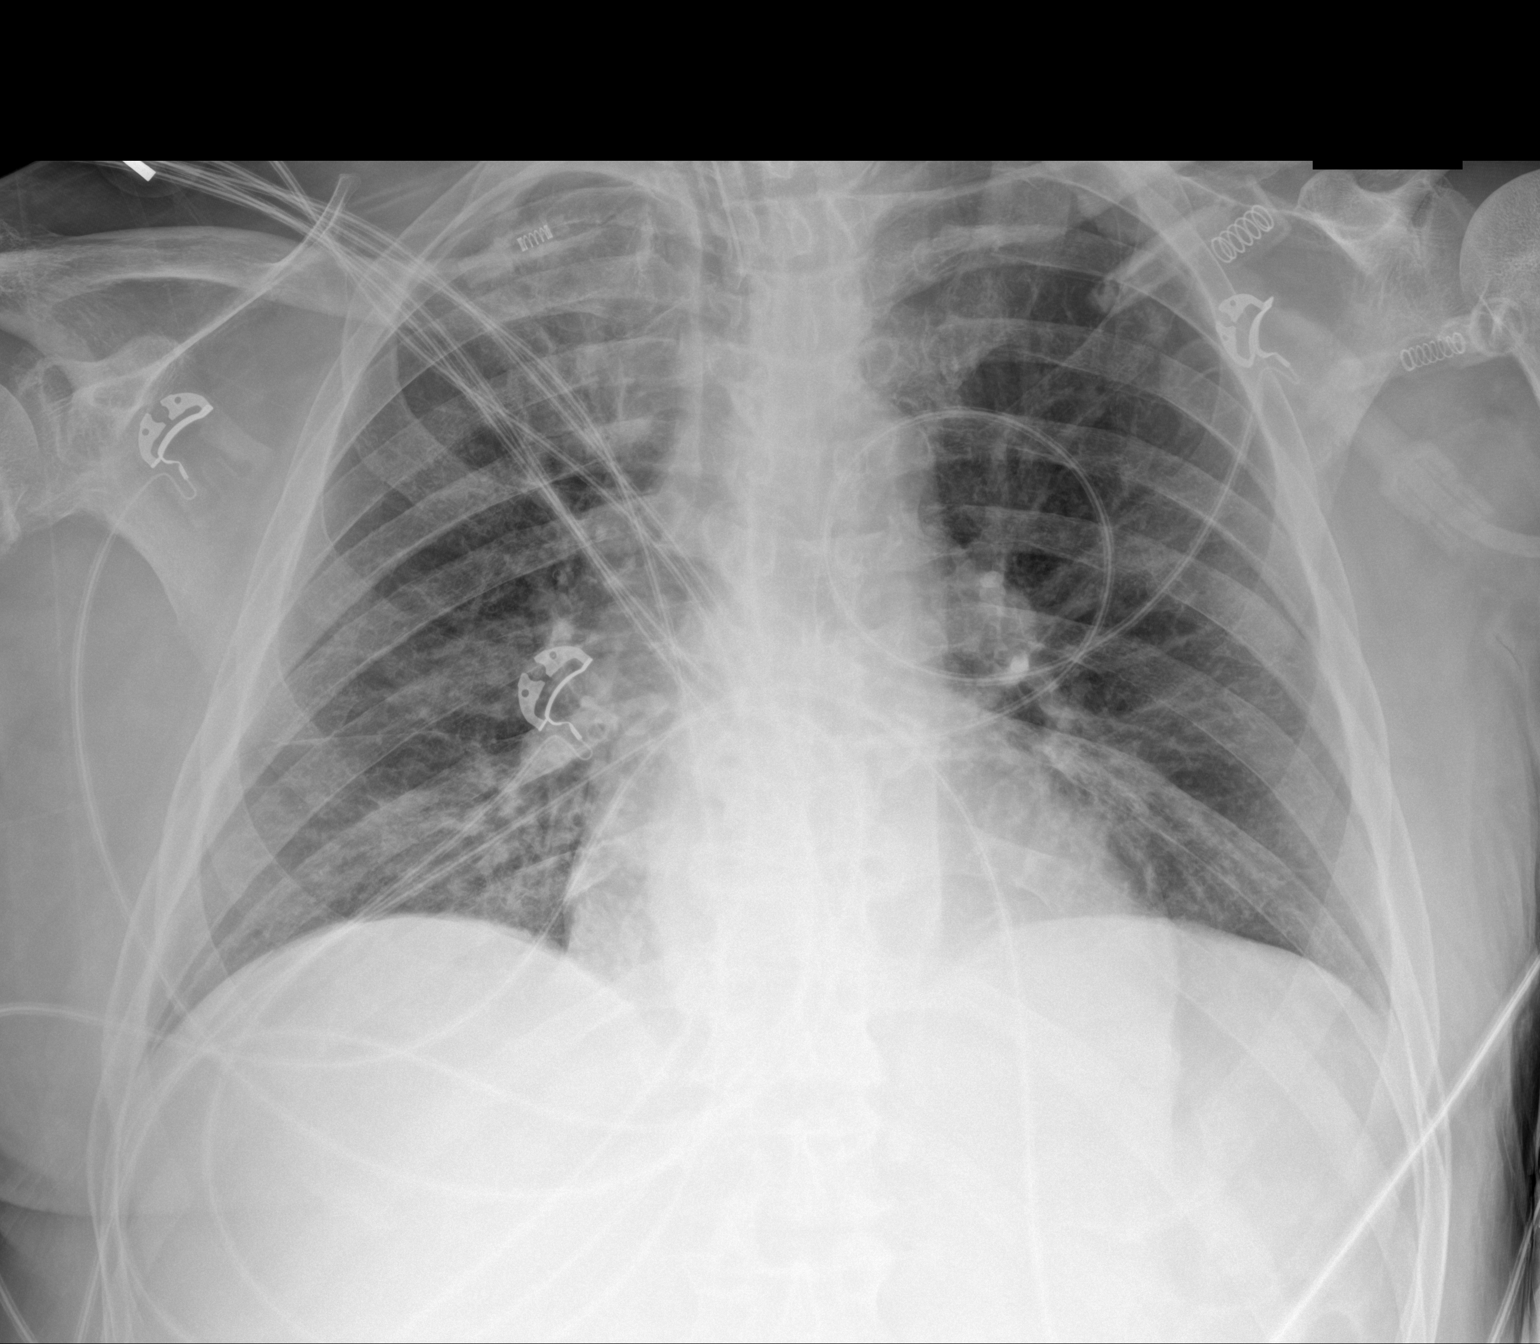

[1 of 1 positions shown; findings below may reference images not displayed]

FINDINGS: Endotracheal tube terminates 7.0 Cm above carina. Numerous leads and
wires project over the chest. Normal heart size. No pleural effusion
or pneumothorax. The peripheral opacities on CT are not readily
apparent. Nonspecific interstitial thickening is not significantly
changed and likely related to COPD/chronic bronchitis.
IMPRESSION: Multifocal pulmonary opacities on yesterday's CT are not readily
plain film apparent. No superimposed lobar consolidation.
